# Patient Record
Sex: Female | Born: 1990
Health system: Southern US, Community
[De-identification: ages and names within clinical notes are randomized; demographics above are authoritative.]

## PROBLEM LIST (undated history)

## (undated) ENCOUNTER — Inpatient Hospital Stay (HOSPITAL_COMMUNITY): Payer: Self-pay

## (undated) DIAGNOSIS — N390 Urinary tract infection, site not specified: Secondary | ICD-10-CM

## (undated) HISTORY — PX: NO PAST SURGERIES: SHX2092

---

## 2015-10-16 ENCOUNTER — Inpatient Hospital Stay (HOSPITAL_COMMUNITY)
Admission: AD | Admit: 2015-10-16 | Discharge: 2015-10-16 | Disposition: A | Discharge status: Home / Self Care | Payer: Self-pay | Source: Ambulatory Visit | Attending: Obstetrics and Gynecology | Admitting: Obstetrics and Gynecology

## 2015-10-16 ENCOUNTER — Inpatient Hospital Stay (HOSPITAL_COMMUNITY): Payer: Self-pay

## 2015-10-16 ENCOUNTER — Encounter (HOSPITAL_COMMUNITY): Payer: Self-pay

## 2015-10-16 DIAGNOSIS — N760 Acute vaginitis: Secondary | ICD-10-CM | POA: Insufficient documentation

## 2015-10-16 DIAGNOSIS — M545 Low back pain, unspecified: Secondary | ICD-10-CM

## 2015-10-16 DIAGNOSIS — R102 Pelvic and perineal pain: Secondary | ICD-10-CM

## 2015-10-16 DIAGNOSIS — M546 Pain in thoracic spine: Secondary | ICD-10-CM | POA: Insufficient documentation

## 2015-10-16 DIAGNOSIS — B9689 Other specified bacterial agents as the cause of diseases classified elsewhere: Secondary | ICD-10-CM

## 2015-10-16 HISTORY — DX: Urinary tract infection, site not specified: N39.0

## 2015-10-16 LAB — URINALYSIS, ROUTINE W REFLEX MICROSCOPIC
BILIRUBIN URINE: NEGATIVE
Glucose, UA: NEGATIVE mg/dL
HGB URINE DIPSTICK: NEGATIVE
KETONES UR: NEGATIVE mg/dL
Nitrite: NEGATIVE
PROTEIN: NEGATIVE mg/dL
Specific Gravity, Urine: 1.02 (ref 1.005–1.030)
pH: 7 (ref 5.0–8.0)

## 2015-10-16 LAB — WET PREP, GENITAL
SPERM: NONE SEEN
Trich, Wet Prep: NONE SEEN
WBC WET PREP: NONE SEEN
Yeast Wet Prep HPF POC: NONE SEEN

## 2015-10-16 LAB — URINE MICROSCOPIC-ADD ON

## 2015-10-16 LAB — POCT PREGNANCY, URINE: PREG TEST UR: NEGATIVE

## 2015-10-16 MED ORDER — IBUPROFEN 600 MG PO TABS: 600.0000 mg | ORAL_TABLET | Freq: Three times a day (TID) | ORAL | 0 refills | Status: DC | PRN

## 2015-10-16 MED ORDER — METRONIDAZOLE 500 MG PO TABS: 500.0000 mg | ORAL_TABLET | Freq: Two times a day (BID) | ORAL | 0 refills | Status: DC

## 2015-10-16 MED ORDER — KETOROLAC TROMETHAMINE 30 MG/ML IJ SOLN
30.0000 mg | Freq: Once | INTRAMUSCULAR | Status: AC
  Administered 2015-10-16: 30 mg via INTRAMUSCULAR
  Filled 2015-10-16: qty 1

## 2015-10-16 NOTE — MAU Note (Signed)
Pt c/o back pain and lower abdominal pain that started today-took ibuprofen this morning and it helped some. Rating pain 7/10. LMP: May 25-26-has hx of irregular periods and is not on birth control. Denies vag bleeding or discharge. States she has burning with urination and increased frequency-feels like she cannot fully empty bladder.

## 2015-10-16 NOTE — MAU Provider Note (Signed)
History   [id number]   Chief Complaint  Patient presents with  . Abdominal Pain  . Back Pain  . Possible Pregnancy    HPI Karen Barajas is a 25 y.o. female  [id number] here with report of right sided mid upper back pain and lower left sided pelvic pain that started yesterday.  Pain in abdomen is described as sharp and intermittent in nature.  Pain is rated an 8/10.  Back pain is described as a spasm and also rated an 8/10.  No report of injury.  Reports vomiting x 1.  No report of fever, body aches, or chills.  Denies diarrhea or constipation.  +vaginal discharge with odor, white in color.    Patient's last menstrual period was 08/18/2015 (lmp unknown).  OB History  Gravida Para Term Preterm AB Living  0 0 0 0 0 0  SAB TAB Ectopic Multiple Live Births  0 0 0 0 0        Past Medical History:  Diagnosis Date  . UTI (lower urinary tract infection)     Family History  Problem Relation Age of Onset  . Diabetes Maternal Grandmother   . Hyperlipidemia Maternal Grandfather   . Cancer Paternal Grandfather     Social History   Social History  . Marital status: Single    Spouse name: N/A  . Number of children: N/A  . Years of education: N/A   Social History Main Topics  . Smoking status: Never Smoker  . Smokeless tobacco: Never Used  . Alcohol use None  . Drug use: No  . Sexual activity: Yes   Other Topics Concern  . None   Social History Narrative  . None    No Known Allergies  No current facility-administered medications on file prior to encounter.    No current outpatient prescriptions on file prior to encounter.     Review of Systems  Constitutional: Negative for chills and fever.  Gastrointestinal: Positive for abdominal pain, nausea and vomiting. Negative for abdominal distention, constipation and diarrhea.  Genitourinary: Positive for menstrual problem (irregular), pelvic pain and vaginal discharge. Negative for dysuria, flank pain, frequency  and vaginal bleeding.  Musculoskeletal: Positive for back pain.  Neurological: Negative for dizziness and light-headedness.     Physical Exam   Vitals:   10/16/15 2000 10/16/15 2008  BP: 134/77   Pulse: 78   Resp: 20   Temp: 98.2 F (36.8 C)   TempSrc: Oral   SpO2: 97%   Weight:  238 lb 1.6 oz (108 kg)  Height:  5' 1.42" (1.56 m)    Physical Exam  Constitutional: She is oriented to person, place, and time. She appears well-developed and well-nourished. No distress.  HENT:  Head: Normocephalic.  Neck: Normal range of motion. Neck supple.  Cardiovascular: Normal rate, regular rhythm and normal heart sounds.   Respiratory: Effort normal and breath sounds normal. No respiratory distress.  GI: Soft. She exhibits no mass. There is tenderness (LLQ). There is no rebound, no guarding and no CVA tenderness. No hernia.  Genitourinary: Cervix exhibits no motion tenderness and no discharge. Left adnexum displays tenderness. No bleeding in the vagina. Vaginal discharge (white, creamy; +odor) found.  Genitourinary Comments: Difficult to assess adnexa due to patient weight and habitus  Musculoskeletal: Normal range of motion. She exhibits no edema.       Arms: Neurological: She is alert and oriented to person, place, and time.  Skin: Skin is warm and dry.  Psychiatric: She has  a normal mood and affect.    MAU Course  Procedures  MDM Toradol 30 mg IM Pelvic ultrasound ordered  Ultrasound: Uterus Measurements: 6.3 x 3.2 x 3.4 cm. No fibroids or other mass visualized. Endometrium Thickness: 4.8 mm.  No focal abnormality visualized. Right ovary Measurements: 2.4 x 1.6 x 1.5 cm. Normal appearance/no adnexal mass. Left ovary Measurements: 3.0 x 1.8 x 1.9 cm. Normal appearance/no adnexal mass. Other findings No abnormal free fluid. IMPRESSION: Unremarkable pelvic ultrasound.  Results for orders placed or performed during the hospital encounter of 10/16/15 (from the past 24  hour(s))  Urinalysis, Routine w reflex microscopic (not at [facility])     Status: Abnormal   Collection Time: 10/16/15  7:50 PM  Result Value Ref Range   Color, Urine YELLOW YELLOW   APPearance CLEAR CLEAR   Specific Gravity, Urine 1.020 1.005 - 1.030   pH 7.0 5.0 - 8.0   Glucose, UA NEGATIVE NEGATIVE mg/dL   Hgb urine dipstick NEGATIVE NEGATIVE   Bilirubin Urine NEGATIVE NEGATIVE   Ketones, ur NEGATIVE NEGATIVE mg/dL   Protein, ur NEGATIVE NEGATIVE mg/dL   Nitrite NEGATIVE NEGATIVE   Leukocytes, UA TRACE (A) NEGATIVE  Urine microscopic-add on     Status: Abnormal   Collection Time: 10/16/15  7:50 PM  Result Value Ref Range   Squamous Epithelial / LPF 0-5 (A) NONE SEEN   WBC, UA 0-5 0 - 5 WBC/hpf   RBC / HPF 0-5 0 - 5 RBC/hpf   Bacteria, UA RARE (A) NONE SEEN  Wet prep, genital     Status: Abnormal   Collection Time: 10/16/15  8:20 PM  Result Value Ref Range   Yeast Wet Prep HPF POC NONE SEEN NONE SEEN   Trich, Wet Prep NONE SEEN NONE SEEN   Clue Cells Wet Prep HPF POC PRESENT (A) NONE SEEN   WBC, Wet Prep HPF POC NONE SEEN NONE SEEN   Sperm NONE SEEN   Pregnancy, urine POC     Status: None   Collection Time: 10/16/15  8:25 PM  Result Value Ref Range   Preg Test, Ur NEGATIVE NEGATIVE   2150 Back pain improved with Toradol Assessment and Plan  Pelvic Pain - Normal Ultrasound Bacterial Vaginosis Back Pain  Plan: Discharge home RX Ibuprofen 600 mg PO q 8 hours prn RX Flagyl 500 mg BID x 7 days Follow-up if no improvement or worsening of symptoms  *All information communicated via interpreter Midge Aver, CNM 10/16/2015 10:05 PM

## 2016-04-18 LAB — OB RESULTS CONSOLE GC/CHLAMYDIA: Gonorrhea: NEGATIVE

## 2016-05-01 ENCOUNTER — Emergency Department (HOSPITAL_COMMUNITY)
Admission: EM | Admit: 2016-05-01 | Discharge: 2016-05-02 | Disposition: A | Discharge status: Home / Self Care | Payer: Self-pay | Attending: Emergency Medicine | Admitting: Emergency Medicine

## 2016-05-01 ENCOUNTER — Encounter (HOSPITAL_COMMUNITY): Payer: Self-pay | Admitting: Emergency Medicine

## 2016-05-01 ENCOUNTER — Emergency Department (HOSPITAL_COMMUNITY): Payer: Self-pay

## 2016-05-01 DIAGNOSIS — R109 Unspecified abdominal pain: Secondary | ICD-10-CM

## 2016-05-01 DIAGNOSIS — Z79899 Other long term (current) drug therapy: Secondary | ICD-10-CM | POA: Insufficient documentation

## 2016-05-01 DIAGNOSIS — N12 Tubulo-interstitial nephritis, not specified as acute or chronic: Secondary | ICD-10-CM | POA: Insufficient documentation

## 2016-05-01 LAB — BASIC METABOLIC PANEL
ANION GAP: 10 (ref 5–15)
BUN: 9 mg/dL (ref 6–20)
CALCIUM: 9.2 mg/dL (ref 8.9–10.3)
CHLORIDE: 103 mmol/L (ref 101–111)
CO2: 22 mmol/L (ref 22–32)
Creatinine, Ser: 0.6 mg/dL (ref 0.44–1.00)
GFR calc Af Amer: >60 mL/min (ref >60)
GFR calc non Af Amer: >60 mL/min (ref >60)
GLUCOSE: 92 mg/dL (ref 65–99)
POTASSIUM: 4.4 mmol/L (ref 3.5–5.1)
Sodium: 135 mmol/L (ref 135–145)

## 2016-05-01 LAB — URINALYSIS, ROUTINE W REFLEX MICROSCOPIC
Bilirubin Urine: NEGATIVE
GLUCOSE, UA: NEGATIVE mg/dL
KETONES UR: NEGATIVE mg/dL
Nitrite: NEGATIVE
PH: 9 — AB (ref 5.0–8.0)
Protein, ur: 30 mg/dL — AB
Specific Gravity, Urine: 1.014 (ref 1.005–1.030)

## 2016-05-01 LAB — WET PREP, GENITAL
Clue Cells Wet Prep HPF POC: NONE SEEN
SPERM: NONE SEEN
Trich, Wet Prep: NONE SEEN
Yeast Wet Prep HPF POC: NONE SEEN

## 2016-05-01 LAB — CBC
HEMATOCRIT: 40.6 % (ref 36.0–46.0)
HEMOGLOBIN: 13.4 g/dL (ref 12.0–15.0)
MCH: 28.5 pg (ref 26.0–34.0)
MCHC: 33 g/dL (ref 30.0–36.0)
MCV: 86.2 fL (ref 78.0–100.0)
Platelets: 220 10*3/uL (ref 150–400)
RBC: 4.71 MIL/uL (ref 3.87–5.11)
RDW: 13.7 % (ref 11.5–15.5)
WBC: 8.7 10*3/uL (ref 4.0–10.5)

## 2016-05-01 MED ORDER — SODIUM CHLORIDE 0.9 % IV BOLUS (SEPSIS)
1000.0000 mL | Freq: Once | INTRAVENOUS | Status: AC
  Administered 2016-05-02: 1000 mL via INTRAVENOUS

## 2016-05-01 MED ORDER — KETOROLAC TROMETHAMINE 60 MG/2ML IM SOLN
30.0000 mg | Freq: Once | INTRAMUSCULAR | Status: AC
  Administered 2016-05-01: 30 mg via INTRAMUSCULAR
  Filled 2016-05-01: qty 2

## 2016-05-01 MED ORDER — DEXTROSE 5 % IV SOLN
1.0000 g | Freq: Once | INTRAVENOUS | Status: AC
  Administered 2016-05-02: 1 g via INTRAVENOUS
  Filled 2016-05-01: qty 10

## 2016-05-01 MED ORDER — KETOROLAC TROMETHAMINE 30 MG/ML IJ SOLN
30.0000 mg | Freq: Once | INTRAMUSCULAR | Status: DC
  Filled 2016-05-01: qty 1

## 2016-05-01 MED ORDER — SODIUM CHLORIDE 0.9 % IV BOLUS (SEPSIS): 1000.0000 mL | Freq: Once | INTRAVENOUS | Status: DC

## 2016-05-01 NOTE — ED Notes (Signed)
ED Provider at bedside using translator ipad.

## 2016-05-01 NOTE — ED Provider Notes (Signed)
[facility]-EMERGENCY DEPT Provider Note   CSN: [id number] Arrival date & time: 05/01/16  1723  By signing my name below, I, Freida Busman, attest that this documentation has been prepared under the direction and in the presence of [facility], PA-C. Electronically Signed: Freida Busman, Scribe. 05/01/2016. 9:31 PM.   History   Chief Complaint Chief Complaint  Patient presents with  . Flank Pain    The history is provided by the patient. A language interpreter was used (spanish).     HPI Comments:  Karen Barajas is a 26 y.o. female who presents to the Emergency Department complaining of 8/10, right flank pain and bilateral low back pain R>L since yesterday morning. She reports associated chills, frequent urination, and HA. Denies fever, abdominal pain or dysuria. No alleviating factors noted. Pt reports h/o similar symptoms ~ 2 years ago states she was told she may have kidney stones. She also has a h/o UTI and states pain today is also reminiscent of that episode. Pt is currently on her period. Denies vaginal discharge.   Past Medical History:  Diagnosis Date  . UTI (lower urinary tract infection)     There are no active problems to display for this patient.   Past Surgical History:  Procedure Laterality Date  . NO PAST SURGERIES      OB History    Gravida Para Term Preterm AB Living   0 0 0 0 0 0   SAB TAB Ectopic Multiple Live Births   0 0 0 0 0       Home Medications    Prior to Admission medications   Medication Sig Start Date End Date Taking? Authorizing Provider  ibuprofen (ADVIL,MOTRIN) 600 MG tablet Take 1 tablet (600 mg total) by mouth every 8 (eight) hours as needed. 10/16/15   Marlis Edelson, CNM  metroNIDAZOLE (FLAGYL) 500 MG tablet Take 1 tablet (500 mg total) by mouth 2 (two) times daily. 10/16/15   Marlis Edelson, CNM    Family History Family History  Problem Relation Age of Onset  . Diabetes Maternal Grandmother   . Hyperlipidemia Maternal  Grandfather   . Cancer Paternal Grandfather     Social History Social History  Substance Use Topics  . Smoking status: Never Smoker  . Smokeless tobacco: Never Used  . Alcohol use Not on file     Allergies   Patient has no known allergies.   Review of Systems Review of Systems  Neurological: Positive for headaches.     Physical Exam Updated Vital Signs BP 110/56   Pulse 100   Temp 100.5 F (38.1 C) (Oral)   Resp 20   LMP 05/01/2016 (Exact Date)   SpO2 100%   Physical Exam  Constitutional: She is oriented to person, place, and time. She appears well-developed and well-nourished. No distress.  Non-toxic   HENT:  Head: Normocephalic and atraumatic.  Cardiovascular: Normal rate, regular rhythm and normal heart sounds.   No murmur heard. Pulmonary/Chest: Effort normal and breath sounds normal. No respiratory distress.  Abdominal: Soft. She exhibits no distension. There is no tenderness. There is CVA tenderness.  CVA tenderness bilateral R>L  No abdominal tenderness   Genitourinary:  Genitourinary Comments: Chaperone present for exam. + vaginal bleeding - patient on menses. No rashes, lesions, or tenderness to external genitalia. No erythema, injury, or tenderness to vaginal mucosa. No  vaginal discharge. Mild left adnexal discomfort. No adnexal masses or fullness. No CMT.   Musculoskeletal: She exhibits no edema.  Neurological: She is alert and oriented to person, place, and time.  Skin: Skin is warm and dry.  Nursing note and vitals reviewed.    ED Treatments / Results  DIAGNOSTIC STUDIES:  Oxygen Saturation is 100% on RA, normal by my interpretation.    COORDINATION OF CARE:  9:31 PM Discussed treatment plan with pt at bedside and pt agreed to plan.  Labs (all labs ordered are listed, but only abnormal results are displayed) Labs Reviewed  URINALYSIS, ROUTINE W REFLEX MICROSCOPIC - Abnormal; Notable for the following:       Result Value   APPearance  HAZY (*)    pH 9.0 (*)    Hgb urine dipstick LARGE (*)    Protein, ur 30 (*)    Leukocytes, UA TRACE (*)    Bacteria, UA FEW (*)    Squamous Epithelial / LPF 0-5 (*)    All other components within normal limits  BASIC METABOLIC PANEL  CBC    EKG  EKG Interpretation None       Radiology No results found.  Procedures Procedures (including critical care time)  Medications Ordered in ED Medications - No data to display   Initial Impression / Assessment and Plan / ED Course  I have reviewed the triage vital signs and the nursing notes.  Pertinent labs & imaging results that were available during my care of the patient were reviewed by me and considered in my medical decision making (see chart for details).    Karen Barajas is a 26 y.o. female with hx of kidney stones who presents to ED for urinary frequency, bilateral low back pain and right flank pain. Initially was afebrile. On exam, normal heart rate and BP. No abdominal tenderness, but patient does have + CVA tenderness R>L. UA shows TNTC white cells. White count normal. On re-evaluation, patient now with temperature of 100.5. Pelvic exam performed with minimal adnexal tenderness. Lactic wdl. Given history of kidney stones, fever and pain today CT was obtained which shows findings c/w pyelonephritis. No stone present. Rocephin given in ED. Will discharge home with rx for Keflex. Urine sent for culture. After fluid bolus and toradol, patient again re-evaluated. She is tolerating PO with no episodes of emesis. She feels comfortable with discharge to home. Strict return precautions discussed. PCP follow up encouraged. All questions answered.   Patient discussed with Dr. Elesa MassedWard who agrees with treatment plan.    Final Clinical Impressions(s) / ED Diagnoses   Final diagnoses:  None    New Prescriptions New Prescriptions   No medications on file   I personally performed the services described in this documentation,  which was scribed in my presence. The recorded information has been reviewed and is accurate.     [facility]olescent Treatment FacilityJaime Pilcher Belita Warsame, PA-C 05/02/16 0352    Layla MawKristen N Karianne Nogueira, DO 05/02/16 [phone]

## 2016-05-01 NOTE — ED Notes (Signed)
Patient transported to US 

## 2016-05-01 NOTE — ED Triage Notes (Signed)
Pt reports right flank pain since this morning, pt reports urinary frequency and nausea. Pt denies fever or chills at home. Spanish interpretor used during triage.

## 2016-05-02 ENCOUNTER — Emergency Department (HOSPITAL_COMMUNITY): Payer: Self-pay

## 2016-05-02 LAB — PREGNANCY, URINE: PREG TEST UR: NEGATIVE

## 2016-05-02 LAB — GC/CHLAMYDIA PROBE AMP (~~LOC~~) NOT AT ARMC
Chlamydia: NEGATIVE
Neisseria Gonorrhea: NEGATIVE

## 2016-05-02 LAB — I-STAT CG4 LACTIC ACID, ED: Lactic Acid, Venous: 1.67 mmol/L (ref 0.5–1.9)

## 2016-05-02 MED ORDER — CEPHALEXIN 500 MG PO CAPS: 500.0000 mg | ORAL_CAPSULE | Freq: Four times a day (QID) | ORAL | 0 refills | Status: DC

## 2016-05-02 NOTE — ED Notes (Signed)
Pt returned from CT °

## 2016-05-02 NOTE — ED Notes (Signed)
Patient transported to CT 

## 2016-05-02 NOTE — ED Notes (Signed)
Pt verbalized understanding of discharge instructions. No further questions. VSS NAD A/Ox4

## 2016-05-02 NOTE — Discharge Instructions (Signed)
It was my pleasure taking care of you today! I hope you feel better soon!  Please take all of your antibiotics until finished! Ibuprofen as needed for pain/fever.  Please follow up with your primary care provider or the clinic listed if symptoms persist longer than three days.  Return to ER if you are unable to keep fluids down, you feel no better in 2-3 days, new or worsening symptoms develop, any additional concerns.

## 2016-05-04 LAB — URINE CULTURE: Culture: >=100000 — AB

## 2016-05-05 ENCOUNTER — Telehealth: Payer: Self-pay

## 2016-05-05 NOTE — Telephone Encounter (Signed)
Post ED Visit - Positive Culture Follow-up  Culture report reviewed by antimicrobial stewardship pharmacist:  []  Enzo BiNathan Batchelder, Pharm.D. []  Celedonio MiyamotoJeremy Frens, Pharm.D., BCPS []  Garvin FilaMike Maccia, Pharm.D. []  Georgina PillionElizabeth Martin, Pharm.D., BCPS []  StonecrestMinh Pham, [place]D., BCPS, AAHIVP []  Estella HuskMichelle Turner, Pharm.D., BCPS, AAHIVP []  Tennis Mustassie Stewart, Pharm.D. []  Sherle Poeob Vincent, Vermont[place]arm[place]D. Rachel Rumbarger Pharm D Positive urine culture Treated with Cephalexin, organism sensitive to the same and no further patient follow-up is required at this time.  Jerry CarasCullom, Vearl Aitken Burnett 05/05/2016, 10:55 AM

## 2016-10-31 ENCOUNTER — Ambulatory Visit: Payer: Self-pay

## 2016-10-31 ENCOUNTER — Encounter: Payer: Self-pay | Admitting: Advanced Practice Midwife

## 2016-10-31 ENCOUNTER — Ambulatory Visit (INDEPENDENT_AMBULATORY_CARE_PROVIDER_SITE_OTHER): Payer: Self-pay | Admitting: Advanced Practice Midwife

## 2016-10-31 ENCOUNTER — Other Ambulatory Visit: Payer: Self-pay | Admitting: Advanced Practice Midwife

## 2016-10-31 VITALS — BP 111/65 | HR 83 | Wt 260.2 lb

## 2016-10-31 DIAGNOSIS — Z3400 Encounter for supervision of normal first pregnancy, unspecified trimester: Secondary | ICD-10-CM | POA: Insufficient documentation

## 2016-10-31 DIAGNOSIS — Z113 Encounter for screening for infections with a predominantly sexual mode of transmission: Secondary | ICD-10-CM

## 2016-10-31 DIAGNOSIS — Z3482 Encounter for supervision of other normal pregnancy, second trimester: Secondary | ICD-10-CM

## 2016-10-31 DIAGNOSIS — Z3490 Encounter for supervision of normal pregnancy, unspecified, unspecified trimester: Secondary | ICD-10-CM

## 2016-10-31 DIAGNOSIS — O3680X0 Pregnancy with inconclusive fetal viability, not applicable or unspecified: Secondary | ICD-10-CM

## 2016-10-31 DIAGNOSIS — Z124 Encounter for screening for malignant neoplasm of cervix: Secondary | ICD-10-CM

## 2016-10-31 LAB — POCT PREGNANCY, URINE: PREG TEST UR: POSITIVE — AB

## 2016-10-31 LAB — POCT URINALYSIS DIP (DEVICE)
Bilirubin Urine: NEGATIVE
GLUCOSE, UA: NEGATIVE mg/dL
Hgb urine dipstick: NEGATIVE
Leukocytes, UA: NEGATIVE
Nitrite: NEGATIVE
PH: 6 (ref 5.0–8.0)
PROTEIN: NEGATIVE mg/dL
UROBILINOGEN UA: 0.2 mg/dL (ref 0.0–1.0)

## 2016-10-31 NOTE — Progress Notes (Signed)
Pt informed that the ultrasound is considered a limited OB ultrasound and is not intended to be a complete ultrasound exam.  Patient also informed that the ultrasound is not being completed with the intent of assessing for fetal or placental anomalies or any pelvic abnormalities.  Explained that the purpose of today's ultrasound is to assess for viability.  Patient acknowledges the purpose of the exam and the limitations of the study.   Single IUP FHR - 156 bpm per PW doppler FM present CRL - 63.3 mm (12 w 4d) FL  - 0.914 mm  (12w 5d) BEDD  05/11/17 per CRL  Wynelle BourgeoisMarie Williams, CNM notified

## 2016-11-01 ENCOUNTER — Encounter: Payer: Self-pay | Admitting: Advanced Practice Midwife

## 2016-11-01 NOTE — Patient Instructions (Signed)
First Trimester of Pregnancy The first trimester of pregnancy is from week 1 until the end of week 13 (months 1 through 3). A week after a sperm fertilizes an egg, the egg will implant on the wall of the uterus. This embryo will begin to develop into a baby. Genes from you and your partner will form the baby. The female genes will determine whether the baby will be a boy or a girl. At 6-8 weeks, the eyes and face will be formed, and the heartbeat can be seen on ultrasound. At the end of 12 weeks, all the baby's organs will be formed. Now that you are pregnant, you will want to do everything you can to have a healthy baby. Two of the most important things are to get good prenatal care and to follow your health care provider's instructions. Prenatal care is all the medical care you receive before the baby's birth. This care will help prevent, find, and treat any problems during the pregnancy and childbirth. Body changes during your first trimester Your body goes through many changes during pregnancy. The changes vary from woman to woman.  You may gain or lose a couple of pounds at first.  You may feel sick to your stomach (nauseous) and you may throw up (vomit). If the vomiting is uncontrollable, call your health care provider.  You may tire easily.  You may develop headaches that can be relieved by medicines. All medicines should be approved by your health care provider.  You may urinate more often. Painful urination may mean you have a bladder infection.  You may develop heartburn as a result of your pregnancy.  You may develop constipation because certain hormones are causing the muscles that push stool through your intestines to slow down.  You may develop hemorrhoids or swollen veins (varicose veins).  Your breasts may begin to grow larger and become tender. Your nipples may stick out more, and the tissue that surrounds them (areola) may become darker.  Your gums may bleed and may be  sensitive to brushing and flossing.  Dark spots or blotches (chloasma, mask of pregnancy) may develop on your face. This will likely fade after the baby is born.  Your menstrual periods will stop.  You may have a loss of appetite.  You may develop cravings for certain kinds of food.  You may have changes in your emotions from day to day, such as being excited to be pregnant or being concerned that something may go wrong with the pregnancy and baby.  You may have more vivid and strange dreams.  You may have changes in your hair. These can include thickening of your hair, rapid growth, and changes in texture. Some women also have hair loss during or after pregnancy, or hair that feels dry or thin. Your hair will most likely return to normal after your baby is born.  What to expect at prenatal visits During a routine prenatal visit:  You will be weighed to make sure you and the baby are growing normally.  Your blood pressure will be taken.  Your abdomen will be measured to track your baby's growth.  The fetal heartbeat will be listened to between weeks 10 and 14 of your pregnancy.  Test results from any previous visits will be discussed.  Your health care provider may ask you:  How you are feeling.  If you are feeling the baby move.  If you have had any abnormal symptoms, such as leaking fluid, bleeding, severe headaches,   or abdominal cramping.  If you are using any tobacco products, including cigarettes, chewing tobacco, and electronic cigarettes.  If you have any questions.  Other tests that may be performed during your first trimester include:  Blood tests to find your blood type and to check for the presence of any previous infections. The tests will also be used to check for low iron levels (anemia) and protein on red blood cells (Rh antibodies). Depending on your risk factors, or if you previously had diabetes during pregnancy, you may have tests to check for high blood  sugar that affects pregnant women (gestational diabetes).  Urine tests to check for infections, diabetes, or protein in the urine.  An ultrasound to confirm the proper growth and development of the baby.  Fetal screens for spinal cord problems (spina bifida) and Down syndrome.  HIV (human immunodeficiency virus) testing. Routine prenatal testing includes screening for HIV, unless you choose not to have this test.  You may need other tests to make sure you and the baby are doing well.  Follow these instructions at home: Medicines  Follow your health care provider's instructions regarding medicine use. Specific medicines may be either safe or unsafe to take during pregnancy.  Take a prenatal vitamin that contains at least 600 micrograms (mcg) of folic acid.  If you develop constipation, try taking a stool softener if your health care provider approves. Eating and drinking  Eat a balanced diet that includes fresh fruits and vegetables, whole grains, good sources of protein such as meat, eggs, or tofu, and low-fat dairy. Your health care provider will help you determine the amount of weight gain that is right for you.  Avoid raw meat and uncooked cheese. These carry germs that can cause birth defects in the baby.  Eating four or five small meals rather than three large meals a day may help relieve nausea and vomiting. If you start to feel nauseous, eating a few soda crackers can be helpful. Drinking liquids between meals, instead of during meals, also seems to help ease nausea and vomiting.  Limit foods that are high in fat and processed sugars, such as fried and sweet foods.  To prevent constipation: ? Eat foods that are high in fiber, such as fresh fruits and vegetables, whole grains, and beans. ? Drink enough fluid to keep your urine clear or pale yellow. Activity  Exercise only as directed by your health care provider. Most women can continue their usual exercise routine during  pregnancy. Try to exercise for 30 minutes at least 5 days a week. Exercising will help you: ? Control your weight. ? Stay in shape. ? Be prepared for labor and delivery.  Experiencing pain or cramping in the lower abdomen or lower back is a good sign that you should stop exercising. Check with your health care provider before continuing with normal exercises.  Try to avoid standing for long periods of time. Move your legs often if you must stand in one place for a long time.  Avoid heavy lifting.  Wear low-heeled shoes and practice good posture.  You may continue to have sex unless your health care provider tells you not to. Relieving pain and discomfort  Wear a good support bra to relieve breast tenderness.  Take warm sitz baths to soothe any pain or discomfort caused by hemorrhoids. Use hemorrhoid cream if your health care provider approves.  Rest with your legs elevated if you have leg cramps or low back pain.  If you develop   varicose veins in your legs, wear support hose. Elevate your feet for 15 minutes, 3-4 times a day. Limit salt in your diet. Prenatal care  Schedule your prenatal visits by the twelfth week of pregnancy. They are usually scheduled monthly at first, then more often in the last 2 months before delivery.  Write down your questions. Take them to your prenatal visits.  Keep all your prenatal visits as told by your health care provider. This is important. Safety  Wear your seat belt at all times when driving.  Make a list of emergency phone numbers, including numbers for family, friends, the hospital, and police and fire departments. General instructions  Ask your health care provider for a referral to a local prenatal education class. Begin classes no later than the beginning of month 6 of your pregnancy.  Ask for help if you have counseling or nutritional needs during pregnancy. Your health care provider can offer advice or refer you to specialists for help  with various needs.  Do not use hot tubs, steam rooms, or saunas.  Do not douche or use tampons or scented sanitary pads.  Do not cross your legs for long periods of time.  Avoid cat litter boxes and soil used by cats. These carry germs that can cause birth defects in the baby and possibly loss of the fetus by miscarriage or stillbirth.  Avoid all smoking, herbs, alcohol, and medicines not prescribed by your health care provider. Chemicals in these products affect the formation and growth of the baby.  Do not use any products that contain nicotine or tobacco, such as cigarettes and e-cigarettes. If you need help quitting, ask your health care provider. You may receive counseling support and other resources to help you quit.  Schedule a dentist appointment. At home, brush your teeth with a soft toothbrush and be gentle when you floss. Contact a health care provider if:  You have dizziness.  You have mild pelvic cramps, pelvic pressure, or nagging pain in the abdominal area.  You have persistent nausea, vomiting, or diarrhea.  You have a bad smelling vaginal discharge.  You have pain when you urinate.  You notice increased swelling in your face, hands, legs, or ankles.  You are exposed to fifth disease or chickenpox.  You are exposed to German measles (rubella) and have never had it. Get help right away if:  You have a fever.  You are leaking fluid from your vagina.  You have spotting or bleeding from your vagina.  You have severe abdominal cramping or pain.  You have rapid weight gain or loss.  You vomit blood or material that looks like coffee grounds.  You develop a severe headache.  You have shortness of breath.  You have any kind of trauma, such as from a fall or a car accident. Summary  The first trimester of pregnancy is from week 1 until the end of week 13 (months 1 through 3).  Your body goes through many changes during pregnancy. The changes vary from  woman to woman.  You will have routine prenatal visits. During those visits, your health care provider will examine you, discuss any test results you may have, and talk with you about how you are feeling. This information is not intended to replace advice given to you by your health care provider. Make sure you discuss any questions you have with your health care provider. Document Released: 03/06/2001 Document Revised: 02/22/2016 Document Reviewed: 02/22/2016 Elsevier Interactive Patient Education  2017 Elsevier   Inc.  

## 2016-11-01 NOTE — Progress Notes (Signed)
  Subjective:    Karen Barajas is a G1P0000 2859w5d being seen today for her first obstetrical visit.  Her obstetrical history is significant for nothing. Patient does intend to breast feed. Pregnancy history fully reviewed.  Patient reports no complaints.  Vitals:   10/31/16 1314  BP: 111/65  Pulse: 83  Weight: 260 lb 3.2 oz (118 kg)    HISTORY: OB History  Gravida Para Term Preterm AB Living  1 0 0 0 0 0  SAB TAB Ectopic Multiple Live Births  0 0 0 0 0    # Outcome Date GA Lbr Len/2nd Weight Sex Delivery Anes PTL Lv  1 Current              Past Medical History:  Diagnosis Date  . UTI (lower urinary tract infection)    Past Surgical History:  Procedure Laterality Date  . NO PAST SURGERIES     Family History  Problem Relation Age of Onset  . Diabetes Maternal Grandmother   . Hyperlipidemia Maternal Grandfather   . Cancer Paternal Grandfather      Exam    Uterus:  Fundal Height: 12 cm  Pelvic Exam:    Perineum: No Hemorrhoids, Normal Perineum   Vulva: Bartholin's, Urethra, Skene's normal   Vagina:  normal discharge   pH:    Cervix: no cervical motion tenderness   Adnexa: no mass, fullness, tenderness   Bony Pelvis: gynecoid  System: Breast:  normal appearance, no masses or tenderness   Skin: normal coloration and turgor, no rashes    Neurologic: oriented, grossly non-focal   Extremities: normal strength, tone, and muscle mass   HEENT neck supple with midline trachea   Mouth/Teeth mucous membranes moist, pharynx normal without lesions   Neck supple   Cardiovascular: regular rate and rhythm   Respiratory:  appears well, vitals normal, no respiratory distress, acyanotic, normal RR, ear and throat exam is normal, neck free of mass or lymphadenopathy, chest clear, no wheezing, crepitations, rhonchi, normal symmetric air entry   Abdomen: soft, non-tender; bowel sounds normal; no masses,  no organomegaly   Urinary: urethral meatus normal       Assessment:    Pregnancy: G1P0000 Patient Active Problem List   Diagnosis Date Noted  . Supervision of normal first pregnancy, antepartum 10/31/2016        Plan:     Initial labs drawn. Prenatal vitamins. Problem list reviewed and updated. Genetic Screening discussed First Screen: requested.  Ultrasound discussed; fetal survey: requested.  Follow up in 4 weeks. 50% of 30 min visit spent on counseling and coordination of care.  Routines reviewed   Karen Barajas 11/01/2016

## 2016-11-01 NOTE — Progress Notes (Signed)
Called [facility] to schedule patient( Detailed Anatomy Scan 14+) ultrasound appointment for 8 weeks from now, but they only schedule out 4 weeks in advance. Will have to call them back in the beginning of September to schedule for the end of the month.

## 2016-11-02 ENCOUNTER — Telehealth: Payer: Self-pay | Admitting: Lab

## 2016-11-02 LAB — HEMOGLOBINOPATHY EVALUATION
HGB C: 0 %
HGB S: 0 %
HGB VARIANT: 0 %
Hemoglobin A2 Quantitation: 2.5 % (ref 1.8–3.2)
Hemoglobin F Quantitation: 0 % (ref 0.0–2.0)
Hgb A: 97.5 % (ref 96.4–98.8)

## 2016-11-02 LAB — OBSTETRIC PANEL, INCLUDING HIV
ANTIBODY SCREEN: NEGATIVE
Basophils Absolute: 0 10*3/uL (ref 0.0–0.2)
Basos: 0 %
EOS (ABSOLUTE): 0.1 10*3/uL (ref 0.0–0.4)
EOS: 1 %
HEMOGLOBIN: 12.6 g/dL (ref 11.1–15.9)
HEP B S AG: NEGATIVE
HIV SCREEN 4TH GENERATION: NONREACTIVE
Hematocrit: 38.6 % (ref 34.0–46.6)
Immature Grans (Abs): 0 10*3/uL (ref 0.0–0.1)
Immature Granulocytes: 0 %
LYMPHS ABS: 2 10*3/uL (ref 0.7–3.1)
Lymphs: 27 %
MCH: 27.7 pg (ref 26.6–33.0)
MCHC: 32.6 g/dL (ref 31.5–35.7)
MCV: 85 fL (ref 79–97)
MONOS ABS: 0.7 10*3/uL (ref 0.1–0.9)
Monocytes: 9 %
NEUTROS ABS: 4.6 10*3/uL (ref 1.4–7.0)
Neutrophils: 63 %
Platelets: 226 10*3/uL (ref 150–379)
RBC: 4.55 x10E6/uL (ref 3.77–5.28)
RDW: 15.7 % — ABNORMAL HIGH (ref 12.3–15.4)
RH TYPE: POSITIVE
RPR: NONREACTIVE
Rubella Antibodies, IGG: 1.33 index (ref >0.99)
WBC: 7.3 10*3/uL (ref 3.4–10.8)

## 2016-11-02 LAB — CULTURE, OB URINE

## 2016-11-02 LAB — URINE CULTURE, OB REFLEX

## 2016-11-02 NOTE — Telephone Encounter (Signed)
Called patient emergency contact(Raquel Beola Cordarra) to contact patient about U/S appt. Patient Karen Barajas was with her mother, and I was able to give her the app[facility] time and date [facility]. Research officer, trade unionpanish Interpreter from [employer] [phone]

## 2016-11-05 LAB — CYTOLOGY - PAP
ADEQUACY: ABSENT
Chlamydia: NEGATIVE
DIAGNOSIS: NEGATIVE
NEISSERIA GONORRHEA: NEGATIVE

## 2016-11-08 ENCOUNTER — Encounter: Payer: Self-pay | Admitting: Family Medicine

## 2016-11-08 ENCOUNTER — Ambulatory Visit (HOSPITAL_COMMUNITY)
Admission: RE | Admit: 2016-11-08 | Discharge: 2016-11-08 | Disposition: A | Discharge status: Home / Self Care | Payer: Self-pay | Source: Ambulatory Visit | Attending: Advanced Practice Midwife | Admitting: Advanced Practice Midwife

## 2016-11-08 ENCOUNTER — Other Ambulatory Visit: Payer: Self-pay | Admitting: Advanced Practice Midwife

## 2016-11-08 ENCOUNTER — Encounter (HOSPITAL_COMMUNITY): Payer: Self-pay

## 2016-11-08 DIAGNOSIS — O99211 Obesity complicating pregnancy, first trimester: Secondary | ICD-10-CM

## 2016-11-08 DIAGNOSIS — O99210 Obesity complicating pregnancy, unspecified trimester: Secondary | ICD-10-CM

## 2016-11-08 DIAGNOSIS — O3680X0 Pregnancy with inconclusive fetal viability, not applicable or unspecified: Secondary | ICD-10-CM

## 2016-11-08 DIAGNOSIS — Z3A13 13 weeks gestation of pregnancy: Secondary | ICD-10-CM

## 2016-11-08 DIAGNOSIS — O99212 Obesity complicating pregnancy, second trimester: Secondary | ICD-10-CM | POA: Insufficient documentation

## 2016-11-08 DIAGNOSIS — Z3682 Encounter for antenatal screening for nuchal translucency: Secondary | ICD-10-CM

## 2016-11-08 DIAGNOSIS — Z3490 Encounter for supervision of normal pregnancy, unspecified, unspecified trimester: Secondary | ICD-10-CM

## 2016-11-08 DIAGNOSIS — Z3400 Encounter for supervision of normal first pregnancy, unspecified trimester: Secondary | ICD-10-CM

## 2016-11-12 ENCOUNTER — Other Ambulatory Visit: Payer: Self-pay

## 2016-11-28 ENCOUNTER — Other Ambulatory Visit: Payer: Self-pay | Admitting: Medical

## 2016-11-28 ENCOUNTER — Ambulatory Visit (INDEPENDENT_AMBULATORY_CARE_PROVIDER_SITE_OTHER): Payer: Self-pay | Admitting: Medical

## 2016-11-28 VITALS — BP 106/73 | HR 102 | Wt 257.5 lb

## 2016-11-28 DIAGNOSIS — Z3402 Encounter for supervision of normal first pregnancy, second trimester: Secondary | ICD-10-CM

## 2016-11-28 DIAGNOSIS — O99210 Obesity complicating pregnancy, unspecified trimester: Secondary | ICD-10-CM

## 2016-11-28 DIAGNOSIS — O99212 Obesity complicating pregnancy, second trimester: Secondary | ICD-10-CM

## 2016-11-28 DIAGNOSIS — Z3400 Encounter for supervision of normal first pregnancy, unspecified trimester: Secondary | ICD-10-CM

## 2016-11-28 NOTE — Progress Notes (Signed)
   PRENATAL VISIT NOTE  Subjective:  Karen Barajas is a 26 y.o. G1P0000 at 5544w4d being seen today for ongoing prenatal care.  She is currently monitored for the following issues for this low-risk pregnancy and has Supervision of normal first pregnancy, antepartum and Obesity affecting pregnancy, antepartum on her problem list.  Patient reports URI symptoms.  Contractions: Not present. Vag. Bleeding: None.  Movement: Present. Denies leaking of fluid.   The following portions of the patient's history were reviewed and updated as appropriate: allergies, current medications, past family history, past medical history, past social history, past surgical history and problem list. Problem list updated.  Objective:   Vitals:   11/28/16 1246  BP: 106/73  Pulse: (!) 102  Weight: 257 lb 8 oz (116.8 kg)    Fetal Status: Fetal Heart Rate (bpm): 150   Movement: Present     General:  Alert, oriented and cooperative. Patient is in no acute distress.  Skin: Skin is warm and dry. No rash noted.   Cardiovascular: Normal heart rate noted  Respiratory: Normal respiratory effort, no problems with respiration noted  Abdomen: Soft, gravid, appropriate for gestational age.  Pain/Pressure: Present     Pelvic: Cervical exam deferred        Extremities: Normal range of motion.  Edema: None  Mental Status:  Normal mood and affect. Normal behavior. Normal judgment and thought content.   Assessment and Plan:  Pregnancy: G1P0000 at 5444w4d  1. Supervision of normal first pregnancy, antepartum - US MFM OB DETAIL +14 WK; scheduled - AFP TETRA  2. Obesity affecting pregnancy, antepartum   Preterm labor symptoms and general obstetric precautions including but not limited to vaginal bleeding, contractions, leaking of fluid and fetal movement were reviewed in detail with the patient. Please refer to After Visit Summary for other counseling recommendations.  Return in about 4 weeks (around 12/26/2016) for  LOB.   Vonzella NippleJulie Zackariah Vanderpol, PA-C

## 2016-11-28 NOTE — Patient Instructions (Addendum)
Crecimiento del beb durante el embarazo (How a Baby Grows During Pregnancy) El embarazo comienza cuando el semen de un hombre ingresa al vulo de una mujer (fecundacin). Esto ocurre en una de las trompas de Falopio que conecta los ovarios con el tero. Al vulo fecundado se lo denomina embrin hasta que alcanza las 10semanas. A partir de las 10semanas y hasta el momento del parto, se llama feto. El vulo fecundado se desplaza por la trompa de Falopio hasta llegar al tero y luego se implanta en el endometrio y empieza crecer. El feto en crecimiento recibe oxgeno y nutrientes a travs del torrente sanguneo de la embarazada y de los tejidos que se forman (placenta) para la sustentacin fetal. La placenta es el sistema de sustentacin de la vida del feto, proporciona la nutricin y elimina los desechos. Informarse tanto como pueda sobre el embarazo y la forma en que se desarrolla el beb puede ayudarla a disfrutar de la experiencia, y, adems, a que se d cuenta de cundo puede haber un problema y cundo hacer preguntas. CUNTO DURA UN EMBARAZO NORMAL? Generalmente, el embarazo dura 280das, o unas 40semanas. Se divide tres trimestres:  Primer trimestre: desde la semana0 a la13.  Segundo trimestre: desde la semana14 a la27.  Tercer trimestre: desde la semana28 a la40. El da que se considera que el beb est listo para nacer (a trmino) es la fecha prevista de parto. CMO SE DESARROLLA EL BEB MES A MES? Primer mes  El vulo fecundado se implanta dentro del tero.  Algunas clulas formarn la placenta, y otras formarn el feto.  Empiezan a desarrollarse los brazos, las piernas, la mdula espinal, los pulmones y el corazn.  Al final del primer mes, el corazn comienza a latir. Segundo mes  Se forman los huesos, el odo interno, los prpados, las manos y los pies.  Se desarrollan los genitales.  Al final de las 8semanas, todos los rganos importantes estn en  desarrollo. Tercer mes  Se estn formando todos los rganos internos.  Se forman los dientes debajo de las encas.  Empiezan a crecer los huesos y los msculos. La columna vertebral tiene movimiento de flexin.  La piel es transparente.  Empiezan a formarse las uas de las manos y de los pies.  Los brazos y las piernas siguen alargndose, y se desarrollan las manos y los pies.  El feto mide aproximadamente 3pulgadas (7,6cm) de largo. Cuarto mes  La placenta est totalmente formada.  Se han formado los rganos sexuales externos, el cuello, las orejas, las cejas, los prpados y las uas de las manos.  El feto puede or, tragar y mover los brazos y las piernas.  Los riones empiezan a producir orina.  La piel est recubierta por una sustancia sebcea blanca (unto sebceo) y un vello muy fino (lanugo). Quinto mes  El feto se mueve ms y es posible sentirlo por primera vez (da pataditas).  Empieza a dormir y despertarse, y tal vez comience a chuparse el dedo.  Crecen las uas en las puntas de los dedos.  Funciona el rgano del sistema digestivo que produce bilis (vescula biliar) y ayuda a digerir los nutrientes.  Si el beb es nia, tiene vulos en los ovarios. Si el beb es varn, los testculos empiezan a descender hasta el escroto. Sexto mes  Se han formado los pulmones, pero el feto an no puede respirar.  Los ojos se abren. El cerebro sigue desarrollndose.  El beb tiene huellas en los dedos de las manos y   los pies. El cabello del beb se vuelve ms abundante.  A fines del segundo trimestre, el feto mide aproximadamente 9pulgadas (22,9cm) de largo. Sptimo mes  El feto patea y se estira.  Los ojos se han desarrollado lo suficiente como para percibir los cambios de luz.  Las manos pueden hacer movimientos de prensin.  El feto responde a los ruidos. Octavo mes  Todos los rganos, as como los sistemas y aparatos del organismo, estn totalmente  desarrollados y en funcionamiento.  Los huesos se solidifican, y se desarrollan los botones gustativos. Es posible que el feto tenga hipo.  Determinadas regiones del cerebro an se estn desarrollando. El crneo sigue siendo blando. Noveno mes  El feto aumenta aproximadamente libra (230g) cada semana.  Los pulmones estn totalmente desarrollados.  Se desarrollan los hbitos de sueo.  Generalmente, el feto se acomoda con la cabeza hacia abajo (presentacin ceflica de vrtice) en el tero para prepararse para el parto. En cambio, si los glteos se acomodan en esta posicin, el beb est de nalgas.  El feto pesa entre 6 y 9libras (2,72 y 4,08kg) y mide entre 19 y 20pulgadas (48,26 a 50,8cm) de largo. QU PUEDO HACER PARA QUE EL EMBARAZO SEA SANO Y PARA AYUDAR AL BEB A DESARROLLARSE? Comida y bebida  Consuma una dieta saludable. ? Hable con el mdico para asegurarse de que est recibiendo los nutrientes que usted y el beb necesitan. ? Visite www.choosemyplate.gov para obtener ms informacin sobre cmo crear una dieta saludable.  El mdico le aconsejar cul es la cantidad saludable de peso a aumentar durante el embarazo, por lo general, entre 25 y 35libras (11 y 16kg). Puede ser necesario que: ? Aumente ms si tena bajo peso antes de quedar embarazada o si est embarazada de ms de un beb. ? Aumente menos si tena sobrepeso u obesidad cuando qued embarazada. Medicamentos y vitaminas  Tome las vitaminas prenatales como se lo haya indicado el mdico, entre ellas, cido flico, hierro, calcio y vitaminaD, que son importantes para el desarrollo saludable.  Tome los medicamentos solamente como se lo haya indicado el mdico. Lea las etiquetas y consulte al farmacutico o al mdico si puede tomar medicamentos de venta libre, suplementos y medicamentos recetados durante el embarazo. Actividades  Haga actividad fsica como se lo haya aconsejado el mdico. Pdale al mdico que  le recomiende actividades que sean seguras para usted, como caminar o practicar natacin.  No participe en deportes extremos ni extenuantes. Estilo de vida  No beba alcohol.  No consuma ningn producto que contenga tabaco, lo que incluye cigarrillos, tabaco de mascar o cigarrillos electrnicos. Si necesita ayuda para dejar de fumar, consulte al mdico.  No consuma drogas. Seguridad  No se exponga al mercurio, al plomo ni a otros metales pesados. Pregntele al mdico acerca de las fuentes comunes de estos metales pesados.  Evite la infeccin por listeria durante el embarazo. Tome las siguientes precauciones: ? No coma quesos blandos ni fiambres. ? No coma perros calientes, salvo que hayan sido calentados al punto de emitir vapor, por ejemplo, en el microondas. ? No tome leche no pasteurizada.  Evite la infeccin por toxoplasmosis durante el embarazo. Tome las siguientes precauciones: ? No cambie la arena sanitaria del gato, si tiene uno. Pdale a otra persona que lo haga por usted. ? Use guantes de jardinera mientras trabaja en el jardn. Instrucciones generales  Concurra a todas las visitas de control como se lo haya indicado el mdico. Esto es importante. Estas incluyen las visitas   de cuidado prenatal y las pruebas de deteccin.  Mantenga las enfermedades crnicas bajo control. Trabaje en estrecha colaboracin con el mdico para mantener las enfermedades bajo control, por ejemplo, la diabetes. CMO S SI EL BEB SE EST DESARROLLANDO BIEN? En cada visita de cuidado prenatal, el mdico har varios estudios diferentes para controlar su estado de salud y hacer un seguimiento del desarrollo del beb. Estos incluyen los siguientes:  Altura uterina. ? El mdico le medir el vientre en crecimiento desde la parte superior a la inferior con una cinta mtrica. ? Adems, le palpar el vientre para determinar la posicin del beb.  Latido cardaco. ? Una ecografa realizada en el primer  trimestre puede confirmar el embarazo y mostrar un latido cardaco, dependiendo del tiempo de gestacin. ? El mdico controlar la frecuencia cardaca del beb en cada visita de cuidado prenatal. ? A medida que se aproxima la fecha de parto, tal vez se hagan controles habituales de la frecuencia cardaca para garantizar que no haya sufrimiento fetal.  Ecografa del segundo trimestre. ? Esta ecografa controla el desarrollo del beb y tambin indica su sexo. QU DEBO HACER SI TENGO ALGUNA INQUIETUD RESPECTO DEL DESARROLLO DEL BEB? Hable siempre con el mdico si tiene alguna inquietud. Esta informacin no tiene como fin reemplazar el consejo del mdico. Asegrese de hacerle al mdico cualquier pregunta que tenga. Document Released: 08/29/2007 Document Revised: 07/04/2015 Document Reviewed: 08/19/2013 Elsevier Interactive Patient Education  2018 Elsevier Inc.  

## 2016-12-03 ENCOUNTER — Encounter: Payer: Self-pay | Admitting: *Deleted

## 2016-12-03 LAB — SCREEN, FIRST TRIMESTER, SERUM: FIRST TRIMESTER SAMPLE: NEGATIVE

## 2016-12-05 LAB — AFP TETRA
DIA Mom Value: 1
DIA Value (EIA): 127.1 pg/mL
DSR (BY AGE) 1 IN: 960
DSR (SECOND TRIMESTER) 1 IN: 10000
Gestational Age: 16 WEEKS
MSAFP MOM: 1.5
MSAFP: 35.7 ng/mL
MSHCG MOM: 1.5
MSHCG: 42052 m[IU]/mL
Maternal Age At EDD: 26.4 yr
Osb Risk: 2734
TEST RESULTS AFP: NEGATIVE
Weight: 257 [lb_av]
uE3 Mom: 1.37
uE3 Value: 0.92 ng/mL

## 2016-12-20 ENCOUNTER — Other Ambulatory Visit (HOSPITAL_COMMUNITY): Payer: Self-pay | Admitting: *Deleted

## 2016-12-20 ENCOUNTER — Ambulatory Visit (HOSPITAL_COMMUNITY)
Admission: RE | Admit: 2016-12-20 | Discharge: 2016-12-20 | Disposition: A | Discharge status: Home / Self Care | Payer: Self-pay | Source: Ambulatory Visit | Attending: Medical | Admitting: Medical

## 2016-12-20 ENCOUNTER — Other Ambulatory Visit: Payer: Self-pay | Admitting: Medical

## 2016-12-20 DIAGNOSIS — Z3A19 19 weeks gestation of pregnancy: Secondary | ICD-10-CM | POA: Insufficient documentation

## 2016-12-20 DIAGNOSIS — O99210 Obesity complicating pregnancy, unspecified trimester: Secondary | ICD-10-CM

## 2016-12-20 DIAGNOSIS — O359XX0 Maternal care for (suspected) fetal abnormality and damage, unspecified, not applicable or unspecified: Secondary | ICD-10-CM

## 2016-12-20 DIAGNOSIS — O358XX0 Maternal care for other (suspected) fetal abnormality and damage, not applicable or unspecified: Secondary | ICD-10-CM | POA: Insufficient documentation

## 2016-12-20 DIAGNOSIS — O99212 Obesity complicating pregnancy, second trimester: Secondary | ICD-10-CM | POA: Insufficient documentation

## 2016-12-20 DIAGNOSIS — Z3400 Encounter for supervision of normal first pregnancy, unspecified trimester: Secondary | ICD-10-CM

## 2016-12-20 DIAGNOSIS — Z0489 Encounter for examination and observation for other specified reasons: Secondary | ICD-10-CM

## 2016-12-20 DIAGNOSIS — Z3689 Encounter for other specified antenatal screening: Secondary | ICD-10-CM | POA: Insufficient documentation

## 2016-12-20 DIAGNOSIS — IMO0002 Reserved for concepts with insufficient information to code with codable children: Secondary | ICD-10-CM

## 2016-12-26 ENCOUNTER — Encounter: Payer: Self-pay | Admitting: Obstetrics & Gynecology

## 2016-12-28 ENCOUNTER — Other Ambulatory Visit: Payer: Self-pay

## 2016-12-31 ENCOUNTER — Telehealth (HOSPITAL_COMMUNITY): Payer: Self-pay | Admitting: MS"

## 2016-12-31 NOTE — Telephone Encounter (Signed)
Attempted to contact patient regarding results of Panorama (NIPS), which are within normal limits. Number listed for patient has automated message saying that the number is not in service. Unable to leave a voicemail for the patient to return call.  Karen Barajas. 12/31/2016 10:17 AM

## 2017-01-17 ENCOUNTER — Other Ambulatory Visit (HOSPITAL_COMMUNITY): Payer: Self-pay | Admitting: Maternal & Fetal Medicine

## 2017-01-17 ENCOUNTER — Ambulatory Visit (HOSPITAL_COMMUNITY)
Admission: RE | Admit: 2017-01-17 | Discharge: 2017-01-17 | Disposition: A | Discharge status: Home / Self Care | Payer: Self-pay | Source: Ambulatory Visit | Attending: Medical | Admitting: Medical

## 2017-01-17 DIAGNOSIS — Z3A23 23 weeks gestation of pregnancy: Secondary | ICD-10-CM

## 2017-01-17 DIAGNOSIS — Z0489 Encounter for examination and observation for other specified reasons: Secondary | ICD-10-CM | POA: Insufficient documentation

## 2017-01-17 DIAGNOSIS — IMO0002 Reserved for concepts with insufficient information to code with codable children: Secondary | ICD-10-CM

## 2017-01-17 DIAGNOSIS — Z362 Encounter for other antenatal screening follow-up: Secondary | ICD-10-CM

## 2017-01-17 DIAGNOSIS — O402XX0 Polyhydramnios, second trimester, not applicable or unspecified: Secondary | ICD-10-CM | POA: Insufficient documentation

## 2017-01-17 DIAGNOSIS — O99212 Obesity complicating pregnancy, second trimester: Secondary | ICD-10-CM | POA: Insufficient documentation

## 2017-01-23 ENCOUNTER — Ambulatory Visit (INDEPENDENT_AMBULATORY_CARE_PROVIDER_SITE_OTHER): Payer: Self-pay | Admitting: Medical

## 2017-01-23 VITALS — BP 104/68 | HR 94 | Wt 263.0 lb

## 2017-01-23 DIAGNOSIS — O409XX0 Polyhydramnios, unspecified trimester, not applicable or unspecified: Secondary | ICD-10-CM

## 2017-01-23 DIAGNOSIS — Z3400 Encounter for supervision of normal first pregnancy, unspecified trimester: Secondary | ICD-10-CM

## 2017-01-23 DIAGNOSIS — O289 Unspecified abnormal findings on antenatal screening of mother: Secondary | ICD-10-CM

## 2017-01-23 DIAGNOSIS — Z23 Encounter for immunization: Secondary | ICD-10-CM

## 2017-01-23 DIAGNOSIS — Z3402 Encounter for supervision of normal first pregnancy, second trimester: Secondary | ICD-10-CM

## 2017-01-23 NOTE — Addendum Note (Signed)
Addended by: Shelly BombardPOLK, Eusebia Grulke M on: 01/23/2017 01:11 PM   Modules accepted: Orders

## 2017-01-23 NOTE — Progress Notes (Signed)
   PRENATAL VISIT NOTE  Subjective:  Karen Barajas is a 26 y.o. G1P0000 at 5960w4d being seen today for ongoing prenatal care.  She is currently monitored for the following issues for this low-risk pregnancy and has Supervision of normal first pregnancy, antepartum and Obesity affecting pregnancy, antepartum on her problem list.  Patient reports occasional cramping.  Contractions: Not present. Vag. Bleeding: None.  Movement: Present. Denies leaking of fluid.   The following portions of the patient's history were reviewed and updated as appropriate: allergies, current medications, past family history, past medical history, past social history, past surgical history and problem list. Problem list updated.  Objective:   Vitals:   01/23/17 1140  BP: 104/68  Pulse: 94  Weight: 263 lb (119.3 kg)    Fetal Status: Fetal Heart Rate (bpm): 144 Fundal Height: 26 cm Movement: Present     General:  Alert, oriented and cooperative. Patient is in no acute distress.  Skin: Skin is warm and dry. No rash noted.   Cardiovascular: Normal heart rate noted  Respiratory: Normal respiratory effort, no problems with respiration noted  Abdomen: Soft, gravid, appropriate for gestational age.  Pain/Pressure: Present     Pelvic: Cervical exam deferred        Extremities: Normal range of motion.  Edema: None  Mental Status:  Normal mood and affect. Normal behavior. Normal judgment and thought content.   Assessment and Plan:  Pregnancy: G1P0000 at 2360w4d  1. Supervision of normal first pregnancy, antepartum -  Discussed GTT at next visit, needs to be fasting  -  Borderline high AFI on last US. Follow-up for growth and re-assessment of AFI in 1 month scheduled today   Preterm labor symptoms and general obstetric precautions including but not limited to vaginal bleeding, contractions, leaking of fluid and fetal movement were reviewed in detail with the patient. Please refer to After Visit Summary for other  counseling recommendations.  Return in about 4 weeks (around 02/20/2017) for LOB, 28 week labs (fasting).   Vonzella NippleJulie Chidi Shirer, PA-C

## 2017-01-23 NOTE — Progress Notes (Signed)
Pt stated having cramping symptoms when sitting.

## 2017-01-23 NOTE — Patient Instructions (Signed)
Crecimiento del beb durante el embarazo (How a Baby Grows During Pregnancy) El embarazo comienza cuando el semen de un hombre ingresa al vulo de una mujer (fecundacin). Esto ocurre en una de las trompas de Falopio que conecta los ovarios con el tero. Al vulo fecundado se lo denomina embrin hasta que alcanza las 10semanas. A partir de las 10semanas y hasta el momento del parto, se llama feto. El vulo fecundado se desplaza por la trompa de Falopio hasta llegar al tero y luego se implanta en el endometrio y empieza crecer. El feto en crecimiento recibe oxgeno y nutrientes a travs del torrente sanguneo de la embarazada y de los tejidos que se forman (placenta) para la sustentacin fetal. La placenta es el sistema de sustentacin de la vida del feto, proporciona la nutricin y elimina los desechos. Informarse tanto como pueda sobre el embarazo y la forma en que se desarrolla el beb puede ayudarla a disfrutar de la experiencia, y, adems, a que se d cuenta de cundo puede haber un problema y cundo hacer preguntas. CUNTO DURA UN EMBARAZO NORMAL? Generalmente, el embarazo dura 280das, o unas 40semanas. Se divide tres trimestres:  Primer trimestre: desde la semana0 a la13.  Segundo trimestre: desde la semana14 a la27.  Tercer trimestre: desde la semana28 a la40. El da que se considera que el beb est listo para nacer (a trmino) es la fecha prevista de parto. CMO SE DESARROLLA EL BEB MES A MES? Primer mes  El vulo fecundado se implanta dentro del tero.  Algunas clulas formarn la placenta, y otras formarn el feto.  Empiezan a desarrollarse los brazos, las piernas, la mdula espinal, los pulmones y el corazn.  Al final del primer mes, el corazn comienza a latir. Segundo mes  Se forman los huesos, el odo interno, los prpados, las manos y los pies.  Se desarrollan los genitales.  Al final de las 8semanas, todos los rganos importantes estn en  desarrollo. Tercer mes  Se estn formando todos los rganos internos.  Se forman los dientes debajo de las encas.  Empiezan a crecer los huesos y los msculos. La columna vertebral tiene movimiento de flexin.  La piel es transparente.  Empiezan a formarse las uas de las manos y de los pies.  Los brazos y las piernas siguen alargndose, y se desarrollan las manos y los pies.  El feto mide aproximadamente 3pulgadas (7,6cm) de largo. Cuarto mes  La placenta est totalmente formada.  Se han formado los rganos sexuales externos, el cuello, las orejas, las cejas, los prpados y las uas de las manos.  El feto puede or, tragar y mover los brazos y las piernas.  Los riones empiezan a producir orina.  La piel est recubierta por una sustancia sebcea blanca (unto sebceo) y un vello muy fino (lanugo). Quinto mes  El feto se mueve ms y es posible sentirlo por primera vez (da pataditas).  Empieza a dormir y despertarse, y tal vez comience a chuparse el dedo.  Crecen las uas en las puntas de los dedos.  Funciona el rgano del sistema digestivo que produce bilis (vescula biliar) y ayuda a digerir los nutrientes.  Si el beb es nia, tiene vulos en los ovarios. Si el beb es varn, los testculos empiezan a descender hasta el escroto. Sexto mes  Se han formado los pulmones, pero el feto an no puede respirar.  Los ojos se abren. El cerebro sigue desarrollndose.  El beb tiene huellas en los dedos de las manos y   los pies. El cabello del beb se vuelve ms abundante.  A fines del segundo trimestre, el feto mide aproximadamente 9pulgadas (22,9cm) de largo. Sptimo mes  El feto patea y se estira.  Los ojos se han desarrollado lo suficiente como para percibir los cambios de luz.  Las manos pueden hacer movimientos de prensin.  El feto responde a los ruidos. Octavo mes  Todos los rganos, as como los sistemas y aparatos del organismo, estn totalmente  desarrollados y en funcionamiento.  Los huesos se solidifican, y se desarrollan los botones gustativos. Es posible que el feto tenga hipo.  Determinadas regiones del cerebro an se estn desarrollando. El crneo sigue siendo blando. Noveno mes  El feto aumenta aproximadamente libra (230g) cada semana.  Los pulmones estn totalmente desarrollados.  Se desarrollan los hbitos de sueo.  Generalmente, el feto se acomoda con la cabeza hacia abajo (presentacin ceflica de vrtice) en el tero para prepararse para el parto. En cambio, si los glteos se acomodan en esta posicin, el beb est de nalgas.  El feto pesa entre 6 y 9libras (2,72 y 4,08kg) y mide entre 19 y 20pulgadas (48,26 a 50,8cm) de largo. QU PUEDO HACER PARA QUE EL EMBARAZO SEA SANO Y PARA AYUDAR AL BEB A DESARROLLARSE? Comida y bebida  Consuma una dieta saludable. ? Hable con el mdico para asegurarse de que est recibiendo los nutrientes que usted y el beb necesitan. ? Visite www.choosemyplate.gov para obtener ms informacin sobre cmo crear una dieta saludable.  El mdico le aconsejar cul es la cantidad saludable de peso a aumentar durante el embarazo, por lo general, entre 25 y 35libras (11 y 16kg). Puede ser necesario que: ? Aumente ms si tena bajo peso antes de quedar embarazada o si est embarazada de ms de un beb. ? Aumente menos si tena sobrepeso u obesidad cuando qued embarazada. Medicamentos y vitaminas  Tome las vitaminas prenatales como se lo haya indicado el mdico, entre ellas, cido flico, hierro, calcio y vitaminaD, que son importantes para el desarrollo saludable.  Tome los medicamentos solamente como se lo haya indicado el mdico. Lea las etiquetas y consulte al farmacutico o al mdico si puede tomar medicamentos de venta libre, suplementos y medicamentos recetados durante el embarazo. Actividades  Haga actividad fsica como se lo haya aconsejado el mdico. Pdale al mdico que  le recomiende actividades que sean seguras para usted, como caminar o practicar natacin.  No participe en deportes extremos ni extenuantes. Estilo de vida  No beba alcohol.  No consuma ningn producto que contenga tabaco, lo que incluye cigarrillos, tabaco de mascar o cigarrillos electrnicos. Si necesita ayuda para dejar de fumar, consulte al mdico.  No consuma drogas. Seguridad  No se exponga al mercurio, al plomo ni a otros metales pesados. Pregntele al mdico acerca de las fuentes comunes de estos metales pesados.  Evite la infeccin por listeria durante el embarazo. Tome las siguientes precauciones: ? No coma quesos blandos ni fiambres. ? No coma perros calientes, salvo que hayan sido calentados al punto de emitir vapor, por ejemplo, en el microondas. ? No tome leche no pasteurizada.  Evite la infeccin por toxoplasmosis durante el embarazo. Tome las siguientes precauciones: ? No cambie la arena sanitaria del gato, si tiene uno. Pdale a otra persona que lo haga por usted. ? Use guantes de jardinera mientras trabaja en el jardn. Instrucciones generales  Concurra a todas las visitas de control como se lo haya indicado el mdico. Esto es importante. Estas incluyen las visitas   de cuidado prenatal y las pruebas de deteccin.  Mantenga las enfermedades crnicas bajo control. Trabaje en estrecha colaboracin con el mdico para mantener las enfermedades bajo control, por ejemplo, la diabetes. CMO S SI EL BEB SE EST DESARROLLANDO BIEN? En cada visita de cuidado prenatal, el mdico har varios estudios diferentes para controlar su estado de salud y hacer un seguimiento del desarrollo del beb. Estos incluyen los siguientes:  Altura uterina. ? El mdico le medir el vientre en crecimiento desde la parte superior a la inferior con una cinta mtrica. ? Adems, le palpar el vientre para determinar la posicin del beb.  Latido cardaco. ? Una ecografa realizada en el primer  trimestre puede confirmar el embarazo y mostrar un latido cardaco, dependiendo del tiempo de gestacin. ? El mdico controlar la frecuencia cardaca del beb en cada visita de cuidado prenatal. ? A medida que se aproxima la fecha de parto, tal vez se hagan controles habituales de la frecuencia cardaca para garantizar que no haya sufrimiento fetal.  Ecografa del segundo trimestre. ? Esta ecografa controla el desarrollo del beb y tambin indica su sexo. QU DEBO HACER SI TENGO ALGUNA INQUIETUD RESPECTO DEL DESARROLLO DEL BEB? Hable siempre con el mdico si tiene alguna inquietud. Esta informacin no tiene como fin reemplazar el consejo del mdico. Asegrese de hacerle al mdico cualquier pregunta que tenga. Document Released: 08/29/2007 Document Revised: 07/04/2015 Document Reviewed: 08/19/2013 Elsevier Interactive Patient Education  2018 Elsevier Inc.  

## 2017-02-21 ENCOUNTER — Ambulatory Visit (INDEPENDENT_AMBULATORY_CARE_PROVIDER_SITE_OTHER): Payer: Self-pay | Admitting: Obstetrics & Gynecology

## 2017-02-21 VITALS — BP 105/77 | HR 79 | Wt 264.2 lb

## 2017-02-21 DIAGNOSIS — O99213 Obesity complicating pregnancy, third trimester: Secondary | ICD-10-CM

## 2017-02-21 DIAGNOSIS — Z23 Encounter for immunization: Secondary | ICD-10-CM

## 2017-02-21 DIAGNOSIS — Z3403 Encounter for supervision of normal first pregnancy, third trimester: Secondary | ICD-10-CM

## 2017-02-21 DIAGNOSIS — O99210 Obesity complicating pregnancy, unspecified trimester: Secondary | ICD-10-CM

## 2017-02-21 DIAGNOSIS — Z3400 Encounter for supervision of normal first pregnancy, unspecified trimester: Secondary | ICD-10-CM

## 2017-02-21 MED ORDER — TETANUS-DIPHTH-ACELL PERTUSSIS 5-2.5-18.5 LF-MCG/0.5 IM SUSP
0.5000 mL | Freq: Once | INTRAMUSCULAR | Status: AC
  Administered 2017-02-21: 0.5 mL via INTRAMUSCULAR

## 2017-02-21 NOTE — Progress Notes (Signed)
   PRENATAL VISIT NOTE  Subjective:  Karen Barajas is a 26 y.o. G1P0000 at 5938w5d being seen today for ongoing prenatal care. Patient is Spanish-speaking only, Spanish interpreter present for this encounter.  She is currently monitored for the following issues for this low-risk pregnancy and has Supervision of normal first pregnancy, antepartum and Maternal morbid obesity, antepartum (HCC) on their problem list.  Patient reports no complaints.  Contractions: Not present. Vag. Bleeding: None.  Movement: Present. Denies leaking of fluid.   The following portions of the patient's history were reviewed and updated as appropriate: allergies, current medications, past family history, past medical history, past social history, past surgical history and problem list. Problem list updated.  Objective:   Vitals:   02/21/17 0822  BP: 105/77  Pulse: 79  Weight: 264 lb 3.2 oz (119.8 kg)    Fetal Status: Fetal Heart Rate (bpm): 147 Fundal Height: 30 cm Movement: Present     General:  Alert, oriented and cooperative. Patient is in no acute distress.  Skin: Skin is warm and dry. No rash noted.   Cardiovascular: Normal heart rate noted  Respiratory: Normal respiratory effort, no problems with respiration noted  Abdomen: Soft, gravid, appropriate for gestational age.  Pain/Pressure: Present     Pelvic: Cervical exam deferred        Extremities: Normal range of motion.  Edema: None  Mental Status:  Normal mood and affect. Normal behavior. Normal judgment and thought content.   Assessment and Plan:  Pregnancy: G1P0000 at 3838w5d  1. Maternal morbid obesity, antepartum (HCC) Total weight gain 14#.   2. Supervision of normal first pregnancy, antepartum Third trimester labs, Tdap today. - Glucose Tolerance, 2 Hours w/1 Hour - RPR - CBC - HIV antibody (with reflex) Preterm labor symptoms and general obstetric precautions including but not limited to vaginal bleeding, contractions, leaking of  fluid and fetal movement were reviewed in detail with the patient. Please refer to After Visit Summary for other counseling recommendations.  Return in about 2 weeks (around 03/07/2017) for OB Visit (LOB).   Jaynie CollinsUgonna Shonte Beutler, MD

## 2017-02-21 NOTE — Progress Notes (Signed)
Spanish Interpreter Elna BreslowCarol Barajas  28 wk packet given Tdap vaccine given  Educated pt on Skin to Skin

## 2017-02-21 NOTE — Patient Instructions (Addendum)
Regrese a la clinica cuando tenga su cita. Si tiene problemas o preguntas, llama a la clinica o vaya a la [facility]a [facility] emergencia [facility]    Alivio del dolor durante el trabajo de parto y el parto (Pain Relief During Labor and Delivery) Muchas cosas pueden causar dolor durante el [place]ajo de parto y el parto, entre ellas, las siguientes:  La presin en los huesos y los ligamentos debido al paso del beb a travs de la pelvis.  La distensin de los tejidos debido al paso del beb a travs del canal de parto.  La tensin muscular debido a la ansiedad y al nerviosismo.  La contraccin y la relajacin del tero para ayudar al paso del beb. Hay muchas formas de lidiar con el dolor del Ophirtrabajo de Robbinsparto y del parto. Estas incluyen las siguientes:  Tomar clases prenatales. Estas clases ayudan a que se sepa qu esperar durante el nacimiento del beb. Lo que se aprende aumentar la confianza y VF Corporationdisminuir la ansiedad.  Practicar tcnicas de relajacin o hacer actividades relajantes, por ejemplo: ? Respiracin localizada. ? Meditacin. ? Visualizacin. ? Aromaterapia. ? Designer, industrial/productscuchar la msica predilecta. ? Hipnosis.  Tomar una ducha o un bao con agua templada (hidroterapia). Esto puede lograr lo siguiente: ? Event organiserBrindar bienestar y relajacin. ? Disminuir la percepcin del dolor. ? Reducir la cantidad de analgsicos que se necesitan. ? Reducir de la duracin del trabajo de Graniteparto.  Recibir un masaje o contrapresin en la espalda.  Aplicar compresas calientes o de hielo.  Cambiar con frecuencia de posicin, moverse o usar una pelota de [place]parto.  Recibir lo siguiente: ? Analgsicos a travs de una va intravenosa (IV) o de una inyeccin intramuscular. ? Analgsicos que se administran en la columna vertebral. ? Inyecciones de agua estril que se aplican justo debajo de la piel en la parte baja de la espalda (inyecciones intradrmicas). ? Gas hilarante (protxido de nitrgeno). Comente  las opciones para el control del dolor con su mdico durante las visitas prenatales. Explore las opciones que ofrecen el hospital o la maternidad. QU TIPOS DE MEDICAMENTOS HAY DISPONIBLES? Hay dos tipos de medicamentos que se pueden usar para Engineer, materialsaliviar el dolor durante el [place]neytrabajo de parto y el parto:  Analgsicos. Estos medicamentos Associate Professoralivian el dolor sin causar la prdida de la sensibilidad ni de la capacidad de PACCAR Incmover los msculos.  Anestsicos. Estos medicamentos inhiben la sensibilidad del cuerpo y pueden reducir la capacidad de moverse con libertad. Estos dos tipos de medicamentos pueden producir efectos secundarios leves, como nuseas, dificultad para concentrarse y somnolencia. Tambin pueden disminuir la frecuencia cardaca del beb antes del nacimiento y afectar su frecuencia respiratoria despus de nacer. Por este motivo, los mdicos son cuidadosos respecto de cundo se administran los medicamentos y de su cantidad. CULES SON LOS MEDICAMENTOS Y LOS PROCEDIMIENTOS ESPECFICOS QUE ALIVIAN EL DOLOR? Anestesia local La anestesia local se [place] para adormecer una pequea zona del cuerpo. Se la puede usar junto con otro tipo de anestesia o emplearse para Frontier Oil Corporationadormecer los nervios de la vagina, del cuello del tero y del perineo durante el perodo expulsivo. Anestesia general La anestesia general produce la prdida del conocimiento de modo que no se siente dolor. Generalmente, solo se la [place] para una cesrea de Associate Professoremergencia. La anestesia general se administra a travs de una va intravenosa (IV) y de Earline Mayotteuna mscara. Bloqueo pudendo El bloqueo pudendo es una forma de anestesia local. Se puede utilizar para Acupuncturistaliviar el dolor asociado con la presin o la  distensin del perineo en el momento del parto, o para adormecer ms el perineo. Para realizar el bloqueo pudendo, se inyecta anestesia a travs de la pared vaginal en un nervio de la pelvis. Anestesia epidural La anestesia epidural se administra a travs de un  catter intravenoso flexible que se coloca en la parte baja de la espalda. La anestesia se administra de manera continua en la zona cercana a los nervios de la columna vertebral (espacio epidural). Despus de recibir este tipo de anestesia, tal vez se puedan mover las piernas, pero lo ms probable es que no se pueda caminar. En funcin de la cantidad de anestesia que se administra, se puede perder toda la sensibilidad en la mitad inferior del cuerpo, o bien se puede conservar cierto nivel de sensibilidad, incluida la necesidad de [place]townpujar. La anestesia epidural se puede usar para Engineer, materialsaliviar el dolor para un parto vaginal. Bloqueo espinal El bloqueo espinal es similar a la anestesia epidural, pero la anestesia se inyecta en el lquido cefalorraqudeo, en lugar del espacio epidural. El bloqueo espinal solo se aplica una vez. Comienza a Engineer, materialsaliviar el dolor rpidamente, pero el alivio solo dura de 1 a Landscape architect6horas. El bloqueo espinal se puede usar para las cesreas. Bloqueo espinal-epidural combinado El bloqueo espinal-epidural combinado combina los efectos del bloqueo espinal y de la anestesia epidural. Acta rpidamente para bloquear todo Chief Technology Officerel dolor. La anestesia epidural alivia el dolor de manera continua, incluso despus de que hayan desaparecido los efectos del bloqueo espinal. Esta informacin no tiene Theme park managercomo fin reemplazar el consejo del mdico. Asegrese de hacerle al mdico cualquier pregunta que tenga. Document Released: 01/07/2009 Document Revised: 03/17/2013 Document Reviewed: 08/03/2015 Elsevier Interactive Patient Education  [employer]2018 Elsevier Inc.

## 2017-02-22 ENCOUNTER — Ambulatory Visit (HOSPITAL_COMMUNITY)
Admission: RE | Admit: 2017-02-22 | Discharge: 2017-02-22 | Disposition: A | Discharge status: Home / Self Care | Payer: Self-pay | Source: Ambulatory Visit | Attending: Medical | Admitting: Medical

## 2017-02-22 DIAGNOSIS — O289 Unspecified abnormal findings on antenatal screening of mother: Secondary | ICD-10-CM | POA: Insufficient documentation

## 2017-02-22 DIAGNOSIS — O99213 Obesity complicating pregnancy, third trimester: Secondary | ICD-10-CM | POA: Insufficient documentation

## 2017-02-22 DIAGNOSIS — Z3400 Encounter for supervision of normal first pregnancy, unspecified trimester: Secondary | ICD-10-CM

## 2017-02-22 DIAGNOSIS — Z3A28 28 weeks gestation of pregnancy: Secondary | ICD-10-CM | POA: Insufficient documentation

## 2017-02-22 DIAGNOSIS — O409XX0 Polyhydramnios, unspecified trimester, not applicable or unspecified: Secondary | ICD-10-CM

## 2017-02-22 LAB — CBC
HEMATOCRIT: 35.3 % (ref 34.0–46.6)
Hemoglobin: 11.9 g/dL (ref 11.1–15.9)
MCH: 29 pg (ref 26.6–33.0)
MCHC: 33.7 g/dL (ref 31.5–35.7)
MCV: 86 fL (ref 79–97)
Platelets: 191 10*3/uL (ref 150–379)
RBC: 4.1 x10E6/uL (ref 3.77–5.28)
RDW: 14.4 % (ref 12.3–15.4)
WBC: 6.5 10*3/uL (ref 3.4–10.8)

## 2017-02-22 LAB — GLUCOSE TOLERANCE, 2 HOURS W/ 1HR
GLUCOSE, 1 HOUR: 160 mg/dL (ref 65–179)
GLUCOSE, FASTING: 74 mg/dL (ref 65–91)
Glucose, 2 hour: 122 mg/dL (ref 65–152)

## 2017-02-22 LAB — RPR: RPR Ser Ql: NONREACTIVE

## 2017-02-22 LAB — HIV ANTIBODY (ROUTINE TESTING W REFLEX): HIV Screen 4th Generation wRfx: NONREACTIVE

## 2017-02-27 ENCOUNTER — Telehealth: Payer: Self-pay | Admitting: General Practice

## 2017-02-27 DIAGNOSIS — O99210 Obesity complicating pregnancy, unspecified trimester: Principal | ICD-10-CM

## 2017-02-27 NOTE — Telephone Encounter (Signed)
Scheduled appts for 12/27 @ 1:45 and 12/7 @ 245. Called patient with pacific interpreter [phone], no answer on patient's number. Unable to leave VM as it had not been set up. Called patient at emergency contact number, no answer but left message to call office regarding an appointment scheduled for Friday.

## 2017-02-27 NOTE — Telephone Encounter (Signed)
-----   Message from Marny LowensteinJulie N Wenzel, PA-C sent at 02/25/2017  2:52 PM EST ----- Per MFM recommendations, patient needs AFI scheduled on Friday (12/7) and follow-up growth at the end of December. Please schedule and inform that patient since she will not be seen in [facility] until 12/12. Thanks!   Marny LowensteinWenzel, Julie N, PA-C 02/25/2017 2:52 PM

## 2017-03-01 ENCOUNTER — Ambulatory Visit (HOSPITAL_COMMUNITY)
Admission: RE | Admit: 2017-03-01 | Discharge: 2017-03-01 | Disposition: A | Discharge status: Home / Self Care | Payer: Self-pay | Source: Ambulatory Visit | Attending: Medical | Admitting: Medical

## 2017-03-01 ENCOUNTER — Other Ambulatory Visit: Payer: Self-pay | Admitting: Medical

## 2017-03-01 DIAGNOSIS — Z3A29 29 weeks gestation of pregnancy: Secondary | ICD-10-CM

## 2017-03-01 DIAGNOSIS — O4103X0 Oligohydramnios, third trimester, not applicable or unspecified: Secondary | ICD-10-CM | POA: Insufficient documentation

## 2017-03-01 DIAGNOSIS — O99213 Obesity complicating pregnancy, third trimester: Secondary | ICD-10-CM | POA: Insufficient documentation

## 2017-03-01 DIAGNOSIS — O99210 Obesity complicating pregnancy, unspecified trimester: Principal | ICD-10-CM

## 2017-03-06 ENCOUNTER — Ambulatory Visit (INDEPENDENT_AMBULATORY_CARE_PROVIDER_SITE_OTHER): Payer: Self-pay | Admitting: Obstetrics & Gynecology

## 2017-03-06 VITALS — BP 115/84 | HR 105 | Wt 268.3 lb

## 2017-03-06 DIAGNOSIS — Z3400 Encounter for supervision of normal first pregnancy, unspecified trimester: Secondary | ICD-10-CM

## 2017-03-06 DIAGNOSIS — Z3403 Encounter for supervision of normal first pregnancy, third trimester: Secondary | ICD-10-CM

## 2017-03-06 NOTE — Patient Instructions (Signed)

## 2017-03-06 NOTE — Progress Notes (Signed)
Spanish video interpreter "Mathis Farelbert" [phone] used for visit PHQ-9 is 10, pt declines to see [facility] today

## 2017-03-06 NOTE — Progress Notes (Signed)
   PRENATAL VISIT NOTE  Subjective:  Karen Barajas is a 26 y.o. G1P0000 at 7733w4d being seen today for ongoing prenatal care.  She is currently monitored for the following issues for this low-risk pregnancy and has Supervision of normal first pregnancy, antepartum and Maternal morbid obesity, antepartum (HCC) on their problem list.  Patient reports no complaints.  Contractions: Irritability. Vag. Bleeding: None.  Movement: Present. Denies leaking of fluid.   The following portions of the patient's history were reviewed and updated as appropriate: allergies, current medications, past family history, past medical history, past social history, past surgical history and problem list. Problem list updated.  Objective:   Vitals:   03/06/17 1536 03/06/17 1546  BP: 130/72 115/84  Pulse: (!) 105   Weight: 268 lb 4.8 oz (121.7 kg)     Fetal Status: Fetal Heart Rate (bpm): 145   Movement: Present     General:  Alert, oriented and cooperative. Patient is in no acute distress.  Skin: Skin is warm and dry. No rash noted.   Cardiovascular: Normal heart rate noted  Respiratory: Normal respiratory effort, no problems with respiration noted  Abdomen: Soft, gravid, appropriate for gestational age.  Pain/Pressure: Present     Pelvic: Cervical exam deferred        Extremities: Normal range of motion.  Edema: None  Mental Status:  Normal mood and affect. Normal behavior. Normal judgment and thought content.   Assessment and Plan:  Pregnancy: G1P0000 at 4633w4d  1. Supervision of normal first pregnancy, antepartum Will recheck BP in one week  Preterm labor symptoms and general obstetric precautions including but not limited to vaginal bleeding, contractions, leaking of fluid and fetal movement were reviewed in detail with the patient. Please refer to After Visit Summary for other counseling recommendations.  Return in about 1 week (around 03/13/2017).   Scheryl DarterJames Haidar Muse, MD

## 2017-03-13 ENCOUNTER — Ambulatory Visit: Payer: Self-pay | Admitting: General Practice

## 2017-03-13 VITALS — BP 105/58 | HR 105 | Ht 62.21 in | Wt 267.0 lb

## 2017-03-13 DIAGNOSIS — Z0130 Encounter for examination of blood pressure without abnormal findings: Secondary | ICD-10-CM

## 2017-03-13 NOTE — Telephone Encounter (Signed)
Pt has been seen, imaging is scheduled.

## 2017-03-13 NOTE — Progress Notes (Signed)
Patient here for BP check today following mildly elevated BP last week at MD visit.. Patient reports a headache yesterday, but nothing since then. BP WNL. Recommended to patient she follow up at scheduled OB visit next week. Patient verbalized understanding

## 2017-03-21 ENCOUNTER — Ambulatory Visit (INDEPENDENT_AMBULATORY_CARE_PROVIDER_SITE_OTHER): Payer: Self-pay | Admitting: Student

## 2017-03-21 ENCOUNTER — Ambulatory Visit (HOSPITAL_COMMUNITY)
Admission: RE | Admit: 2017-03-21 | Discharge: 2017-03-21 | Disposition: A | Discharge status: Home / Self Care | Payer: Self-pay | Source: Ambulatory Visit | Attending: Medical | Admitting: Medical

## 2017-03-21 ENCOUNTER — Other Ambulatory Visit: Payer: Self-pay | Admitting: Medical

## 2017-03-21 DIAGNOSIS — Z3A32 32 weeks gestation of pregnancy: Secondary | ICD-10-CM | POA: Insufficient documentation

## 2017-03-21 DIAGNOSIS — O99210 Obesity complicating pregnancy, unspecified trimester: Secondary | ICD-10-CM

## 2017-03-21 DIAGNOSIS — O99213 Obesity complicating pregnancy, third trimester: Secondary | ICD-10-CM | POA: Insufficient documentation

## 2017-03-21 DIAGNOSIS — O26849 Uterine size-date discrepancy, unspecified trimester: Secondary | ICD-10-CM

## 2017-03-21 DIAGNOSIS — O26843 Uterine size-date discrepancy, third trimester: Secondary | ICD-10-CM

## 2017-03-21 DIAGNOSIS — Z3400 Encounter for supervision of normal first pregnancy, unspecified trimester: Secondary | ICD-10-CM

## 2017-03-21 NOTE — Progress Notes (Addendum)
   PRENATAL VISIT NOTE  Subjective:  Karen Barajas is a 26 y.o. G1P0000 at 2826w5d being seen today for ongoing prenatal care.  She is currently monitored for the following issues for this low-risk pregnancy and has Supervision of normal first pregnancy, antepartum; Maternal morbid obesity, antepartum (HCC); and Significant discrepancy between uterine size and clinical dates, antepartum on their problem list.  Patient reports no complaints.  Contractions: Irritability. Vag. Bleeding: None.  Movement: Present. Denies leaking of fluid.   The following portions of the patient's history were reviewed and updated as appropriate: allergies, current medications, past family history, past medical history, past social history, past surgical history and problem list. Problem list updated.  Objective:   Vitals:   03/21/17 1331  BP: 107/73  Pulse: 90  Weight: 269 lb 8 oz (122.2 kg)    Fetal Status: Fetal Heart Rate (bpm): 132 Fundal Height: 36 cm Movement: Present     General:  Alert, oriented and cooperative. Patient is in no acute distress.  Skin: Skin is warm and dry. No rash noted.   Cardiovascular: Normal heart rate noted  Respiratory: Normal respiratory effort, no problems with respiration noted  Abdomen: Soft, gravid, appropriate for gestational age.  Pain/Pressure: Present     Pelvic: Cervical exam deferred        Extremities: Normal range of motion.  Edema: Trace  Mental Status:  Normal mood and affect. Normal behavior. Normal judgment and thought content.   Assessment and Plan:  Pregnancy: G1P0000 at 326w5d  1. Supervision of normal first pregnancy, antepartum Doing well. -BP normal today.  2. Significant discrepancy between uterine size and clinical dates, antepartum Patient scheduled for growth US today. (patient may have to reschedule bc [facility]. Emphasized to patient the importance of rescheduling).  Preterm labor symptoms and general obstetric  precautions including but not limited to vaginal bleeding, contractions, leaking of fluid and fetal movement were reviewed in detail with the patient. Please refer to After Visit Summary for other counseling recommendations.  Return in about 2 weeks (around 04/04/2017).   Karen Barajas, CNM

## 2017-03-21 NOTE — Patient Instructions (Signed)

## 2017-03-21 NOTE — Progress Notes (Signed)
Stratus interpreter Nadya [id number]   

## 2017-03-26 NOTE — L&D Delivery Note (Addendum)
Delivery Note Pt began pushing at approx 0415, and at 5:57 AM a viable female was delivered via Vaginal, Spontaneous (Presentation: ROA ).  APGAR: 3, 9; weight: pending .  No difficulty with delivery of shoulders. Thick mec noted at deliver. Infant dried, bulb suctioned, and placed on pt's abd; infant floppy, so cord clamped and cut by CNM and infant to warmer for resus eval. Placenta status: spont, intact .  Cord: 3 vessel; Cord pH: 7.268  Anesthesia:  Epidural Episiotomy: None Lacerations: 2nd degree;Perineal Suture Repair: 3.0 monocryl Est. Blood Loss (mL): 200  Mom to postpartum.  Baby to Couplet care / Skin to Skin.  [name], [name] CNM 05/16/2017, 6:40 AM  Please schedule this patient for Postpartum visit in: 4 weeks with the following provider: Any provider For C/S patients schedule nurse incision check in weeks 2 weeks: no Low risk pregnancy complicated by: BMI 52 Delivery mode:  SVD Anticipated Birth Control:  Other/unsure- patch when weans PP Procedures needed: none  Schedule Integrated BH visit: no

## 2017-04-01 ENCOUNTER — Encounter (HOSPITAL_COMMUNITY): Payer: Self-pay | Admitting: Emergency Medicine

## 2017-04-01 ENCOUNTER — Inpatient Hospital Stay (HOSPITAL_COMMUNITY)
Admission: AD | Admit: 2017-04-01 | Discharge: 2017-04-02 | Disposition: A | Discharge status: Home / Self Care | Payer: Self-pay | Source: Ambulatory Visit | Attending: Obstetrics and Gynecology | Admitting: Obstetrics and Gynecology

## 2017-04-01 DIAGNOSIS — O9A213 Injury, poisoning and certain other consequences of external causes complicating pregnancy, third trimester: Secondary | ICD-10-CM | POA: Insufficient documentation

## 2017-04-01 DIAGNOSIS — O4703 False labor before 37 completed weeks of gestation, third trimester: Secondary | ICD-10-CM

## 2017-04-01 DIAGNOSIS — R109 Unspecified abdominal pain: Secondary | ICD-10-CM | POA: Insufficient documentation

## 2017-04-01 DIAGNOSIS — O26899 Other specified pregnancy related conditions, unspecified trimester: Secondary | ICD-10-CM

## 2017-04-01 DIAGNOSIS — T7840XA Allergy, unspecified, initial encounter: Secondary | ICD-10-CM

## 2017-04-01 DIAGNOSIS — Z79899 Other long term (current) drug therapy: Secondary | ICD-10-CM | POA: Insufficient documentation

## 2017-04-01 DIAGNOSIS — X58XXXA Exposure to other specified factors, initial encounter: Secondary | ICD-10-CM | POA: Insufficient documentation

## 2017-04-01 DIAGNOSIS — Z3A34 34 weeks gestation of pregnancy: Secondary | ICD-10-CM | POA: Insufficient documentation

## 2017-04-01 DIAGNOSIS — O26893 Other specified pregnancy related conditions, third trimester: Secondary | ICD-10-CM | POA: Insufficient documentation

## 2017-04-01 LAB — URINALYSIS, ROUTINE W REFLEX MICROSCOPIC
BILIRUBIN URINE: NEGATIVE
Glucose, UA: NEGATIVE mg/dL
Hgb urine dipstick: NEGATIVE
Ketones, ur: NEGATIVE mg/dL
LEUKOCYTES UA: NEGATIVE
NITRITE: NEGATIVE
PH: 7 (ref 5.0–8.0)
Protein, ur: NEGATIVE mg/dL
SPECIFIC GRAVITY, URINE: 1.013 (ref 1.005–1.030)

## 2017-04-01 MED ORDER — NIFEDIPINE 10 MG PO CAPS: 10.0000 mg | ORAL_CAPSULE | Freq: Four times a day (QID) | ORAL | 0 refills | Status: DC | PRN

## 2017-04-01 MED ORDER — LACTATED RINGERS IV BOLUS (SEPSIS)
1000.0000 mL | Freq: Once | INTRAVENOUS | Status: AC
  Administered 2017-04-01: 1000 mL via INTRAVENOUS

## 2017-04-01 MED ORDER — CYCLOBENZAPRINE HCL 5 MG PO TABS
5.0000 mg | ORAL_TABLET | Freq: Once | ORAL | Status: AC
  Administered 2017-04-01: 5 mg via ORAL
  Filled 2017-04-01: qty 1

## 2017-04-01 MED ORDER — DIPHENHYDRAMINE HCL 50 MG/ML IJ SOLN
50.0000 mg | Freq: Once | INTRAMUSCULAR | Status: AC
  Administered 2017-04-01: 50 mg via INTRAVENOUS

## 2017-04-01 MED ORDER — DIPHENHYDRAMINE HCL 50 MG/ML IJ SOLN
INTRAMUSCULAR | Status: AC
  Administered 2017-04-01: 50 mg via INTRAVENOUS
  Filled 2017-04-01: qty 1

## 2017-04-01 NOTE — MAU Provider Note (Signed)
History     CSN: [id number]  Arrival date and time: 04/01/17 2047   First Provider Initiated Contact with Patient 04/01/17 2137     Chief Complaint  Patient presents with  . Contractions   HPI Karen Barajas is a 27 y.o. G1P0000 at 4011w2d who presents with contractions. She states the pain started at 2100 and has gotten progressively worse. She reports the pain a 7/10 and has not tried anything for the pain. She denies any vaginal bleeding, leaking of fluid or discharge. Reports good fetal movement. [facility] and denies any problems during this pregnancy.   OB History    Gravida Para Term Preterm AB Living   1 0 0 0 0 0   SAB TAB Ectopic Multiple Live Births   0 0 0 0 0      Past Medical History:  Diagnosis Date  . UTI (lower urinary tract infection)     Past Surgical History:  Procedure Laterality Date  . NO PAST SURGERIES      Family History  Problem Relation Age of Onset  . Diabetes Maternal Grandmother   . Hyperlipidemia Maternal Grandfather   . Cancer Paternal Grandfather     Social History   Tobacco Use  . Smoking status: Never Smoker  . Smokeless tobacco: Never Used  Substance Use Topics  . Alcohol use: No  . Drug use: No    Allergies: No Known Allergies  Medications Prior to Admission  Medication Sig Dispense Refill Last Dose  . acetaminophen (TYLENOL) 500 MG tablet Take 500 mg by mouth every 6 (six) hours as needed.   Taking  . Prenatal Vit-Fe Fumarate-FA (MULTIVITAMIN-PRENATAL) 27-0.8 MG TABS tablet Take 1 tablet by mouth daily at 12 noon.   Taking    Review of Systems  Constitutional: Negative.  Negative for fatigue and fever.  HENT: Negative.   Respiratory: Negative.  Negative for shortness of breath.   Cardiovascular: Negative.  Negative for chest pain.  Gastrointestinal: Positive for abdominal pain. Negative for constipation, diarrhea, nausea and vomiting.  Genitourinary: Negative.  Negative for dysuria,  vaginal bleeding and vaginal discharge.  Neurological: Negative.  Negative for dizziness and headaches.   Physical Exam   Blood pressure 108/76, pulse (!) 102, temperature 97.8 F (36.6 C), temperature source Oral, resp. rate 18, height 5' 1.04" (1.55 m), weight 247 lb 4 oz (112.2 kg), last menstrual period 06/25/2016, SpO2 99 %.  Physical Exam  Nursing note and vitals reviewed. Constitutional: She is oriented to person, place, and time. She appears well-developed and well-nourished. No distress.  HENT:  Head: Normocephalic.  Eyes: Pupils are equal, round, and reactive to light.  Cardiovascular: Normal rate, regular rhythm and normal heart sounds.  Respiratory: Effort normal and breath sounds normal. No respiratory distress.  GI: Soft. Bowel sounds are normal. She exhibits no distension. There is no tenderness.  Neurological: She is alert and oriented to person, place, and time.  Skin: Skin is warm and dry.  Psychiatric: She has a normal mood and affect. Her behavior is normal. Judgment and thought content normal.   Dilation: Closed Effacement (%): 50 Cervical Position: Middle Station: -2 Exam by:: Jahlon Baines, cnm   Fetal Tracing:  Baseline: 130 Variability: moderate Accels: 15x15 Decels: none  Toco: 5-6  MAU Course  Procedures Results for orders placed or performed during the hospital encounter of 04/01/17 (from the past 24 hour(s))  Urinalysis, Routine w reflex microscopic     Status:  None   Collection Time: 04/01/17  8:58 PM  Result Value Ref Range   Color, Urine YELLOW YELLOW   APPearance CLEAR CLEAR   Specific Gravity, Urine 1.013 1.005 - 1.030   pH 7.0 5.0 - 8.0   Glucose, UA NEGATIVE NEGATIVE mg/dL   Hgb urine dipstick NEGATIVE NEGATIVE   Bilirubin Urine NEGATIVE NEGATIVE   Ketones, ur NEGATIVE NEGATIVE mg/dL   Protein, ur NEGATIVE NEGATIVE mg/dL   Nitrite NEGATIVE NEGATIVE   Leukocytes, UA NEGATIVE NEGATIVE   MDM UA LR bolus Flexeril 5mg   PO  After administration of flexeril, patient became short of breath and complaining of chest tightness and itching in her throat. 50mg  IV benedryl given and Dr. Vergie Living at bedside. Patient reported some relief with benedryl but still felt tightness in her chest. Will infuse fluids and obtain EKG.  EKG- normal sinus rhythm No cervical change over 2 hours, patient reports relief from pain and throat feels better Reviewed with Dr. Vergie Living- ok to discharge patient home with prescription for procardia Added flexeril to allergy list  Assessment and Plan   1. Threatened premature labor in third trimester   2. Abdominal pain affecting pregnancy   3. [redacted] weeks gestation of pregnancy   4. Allergic reaction to drug, initial encounter    -Discharge home in stable condition -Rx for procardia 10mg  q6 hrs PRN given to patient -Preterm labor and allergic reaction precautions discussed -Patient advised to [facility]-up with [facility] as scheduled for prenatal care -Patient may return to MAU as needed or if her condition were to change or worsen  Rolm Bookbinder CNM 04/01/2017, 9:44 PM   Allergies as of 04/01/2017      Reactions   Flexeril [cyclobenzaprine] Itching   Itchy dry throat, chest tightness      Medication List    TAKE these medications   acetaminophen 500 MG tablet Commonly known as:  TYLENOL Take 500 mg by mouth every 6 (six) hours as needed.   multivitamin-prenatal 27-0.8 MG Tabs tablet Take 1 tablet by mouth daily at 12 noon.   NIFEdipine 10 MG capsule Commonly known as:  PROCARDIA Take 1 capsule (10 mg total) by mouth every 6 (six) hours as needed.

## 2017-04-01 NOTE — MAU Note (Signed)
2306 Pt states that her throat and chest are a little better.  O2 sat 100%  BP 110/53 HR 83

## 2017-04-01 NOTE — Discharge Instructions (Signed)
Dolor abdominal durante el embarazo (Abdominal Pain During Pregnancy) El dolor de vientre (abdominal) es habitual durante el embarazo. Generalmente no se trata de un problema grave. Otras veces puede ser un signo de que algo no anda bien. Siempre comunquese con su mdico si tiene dolor abdominal. CUIDADOS EN EL HOGAR Controle el dolor para ver si hay cambios. Las indicaciones que siguen pueden ayudarla a sentirse mejor:  Optician, dispensing (relaciones sexuales) ni se coloque nada dentro de la vagina hasta que se sienta mejor.  Haga reposo hasta que el dolor se calme.  Si siente ganas de vomitar (nuseas ) beba lquidos claros. No consuma alimentos slidos hasta que se sienta mejor.  Slo tome los medicamentos que le haya indicado su mdico.  Cumpla con las visitas al mdico segn las indicaciones. SOLICITE AYUDA DE INMEDIATO SI:  Tiene un sangrado, pierde lquido o elimina trozos de tejido por la vagina.  Siente ms dolor o clicos.  Comienza a vomitar.  Siente dolor al orinar u observa sangre en la orina.  Tiene fiebre.  No siente que el beb se mueva mucho.  Se siente muy dbil o cree que va a desmayarse.  Tiene dificultad para respirar con o sin dolor en el vientre.  Siente un dolor de cabeza muy intenso y Social research officer, government en el vientre.  Observa que sale un lquido por la vagina y tiene dolor abdominal.  La materia fecal es lquida (diarrea).  El dolor en el viente no desaparece, o empeora, luego de hacer reposo. ASEGRESE DE QUE:  Comprende estas instrucciones.  Controlar su afeccin.  Recibir ayuda de inmediato si no mejora o si empeora. Esta informacin no tiene Marine scientist el consejo del mdico. Asegrese de hacerle al mdico cualquier pregunta que tenga. Document Released: 11/22/2010 Document Revised: 07/04/2015 Document Reviewed: 10/09/2012 Elsevier Interactive Patient Education  2018 [place] a los medicamentos (Drug Allergy) La alergia a los  medicamentos ocurre cuando el sistema de defensa del organismo, encargado de Restaurant manager, fast food contra las enfermedades (sistema inmunitario) reacciona mal a un medicamento. Las [employer] a los medicamentos varan de leves a graves y pueden ser potencialmente mortales en [employer]. Algunas reacciones alrgicas ocurren una semana o ms despus de haber estado expuesto a un medicamento (reaccin tarda). Ardelia Mems reaccin anafilctica (anafilaxia) es una reaccin alrgica grave y repentina (aguda) que afecta a mltiples zonas del cuerpo. La anafilaxia puede ser potencialmente mortal. Las reacciones alrgicas a un medicamento requieren una evaluacin mdica, incluso si una reaccin alrgica parece ser leve (menor). CAUSAS Las Medtronic a los medicamentos suceden cuando el sistema inmunitario identifica equivocadamente a un medicamento como nocivo. Cuando esto sucede, el organismo libera protenas (anticuerpos) y otros componentes, como la histamina, en el torrente sanguneo. Esto provoca la hinchazn de ciertos tejidos y la disminucin de la presin arterial en zonas importantes, como el corazn y los pulmones. Casi todos los [employer] pueden provocar una Risk analyst. Los medicamentos que con ms frecuencia causan Chief of Staff (son alrgenos comunes) incluyen los siguientes:  Penicilina.  Medicamentos sulfa (sulfonamidas).  Medicamentos que adormecen ciertas zonas del cuerpo (anestsicos locales).  Colorantes de radiografas que contienen yodo. SNTOMAS Reaccin alrgica leve  Congestin nasal.  Hormigueo en la boca.  Erupcin cutnea de color rojo que causa picazn. Reaccin alrgica grave  Hinchazn de los ojos, los labios, el rostro o la [place].  Hinchazn de la parte posterior de la boca y la garganta.  Sibilancias.  Voz ronca.  Zonas de piel hinchadas, rojas y que producen  picazn (ronchas).  Mareos o sensacin de desvanecimiento.  Desmayos.  Ansiedad o confusin.  Dolor  abdominal.  Dificultad para respirar, para hablar o para tragar.  Opresin en el pecho.  Latidos cardacos acelerados o irregulares (palpitaciones).  Vmitos.  Diarrea. DIAGNSTICO Esta afeccin se diagnostica mediante un examen fsico y los antecedentes de exposicin reciente a uno o ms medicamentos. Podr ser derivado a un mdico especialista en alergias para que le realice estudios de control. Estos estudios pueden confirmar el diagnstico de una alergia a un medicamento y Teacher, adult education a qu medicamentos es Air cabin crew. Los estudios pueden incluir lo siguiente:  Pruebas cutneas. Estas pueden implicar lo siguiente: ? Inyectar una pequea cantidad de los probables alrgenos entre las capas de la piel (inyeccin intradrmica). ? Aplicacin de parches en la piel.  Anlisis de [place].  Prueba de exposicin a medicamentos. En [employer], el mdico le administra una pequea cantidad de un medicamento en dosis graduales mientras observa si hay alguna reaccin alrgica. Si no est seguro de lo que Psychiatric nurse, Product/process development scientist lo siguiente:  Informacin sobre todos los medicamentos que toma regularmente, por ejemplo: ? El nombre de cada medicamento. ? La cantidad (dosis) y la frecuencia en que toma cada medicamento por da. ? La forma de cada medicamento, como por ejemplo, comprimido, lquido o inyeccin.  La fecha y hora de la reaccin. TRATAMIENTO No hay cura para las alergias. Sin embargo, Nurse, mental health se puede tratar con lo siguiente:  Medicamentos para lo siguiente: ? Dietitian y la hinchazn Dayna Ramus). ? Aliviar la picazn y la urticaria (antihistamnicos). ? Reducir la hinchazn (corticoides).  Inhaladores. Estos son medicamentos que se inhalan para ampliar (dilatar) las vas respiratorias de los pulmones.  Inyecciones de medicamentos que ayudan a [employer] de las vas respiratorias y Museum/gallery curator los vasos sanguneos  (epinefrina). Las Chief of Staff graves, como la [place], requieren tratamiento inmediato en un hospital. Si tiene una reaccin anafilctica, podrn hospitalizarlo para su observacin. Podrn indicarle medicamentos de rescate, como la epinefrina. La epinefrina tiene distintas presentaciones que incluyen lo que frecuentemente se denomina "Pensions consultant (dispositivo automtico precargado para inyectar epinefrina). INSTRUCCIONES PARA EL CUIDADO EN EL HOGAR Si tiene una alergia grave  Siempre tenga a mano un lpiz autoinyector o un kit anafilctico. Esto puede salvarle la vida si tiene una reaccin grave. Use su lpiz autoinyector o Water quality scientist se lo haya indicado el mdico.  Asegrese de que usted, quines vivan con usted y su empleador sepan hacer lo siguiente: ? Administrator, Civil Service de anafilaxia. ? Cmo usar el lpiz autoinyector para aplicarse una inyeccin de epinefrina.  Reponga la epinefrina inmediatamente despus de usar su Civil engineer, contracting, en caso de volver a Special educational needs teacher reaccin en el futuro.  Lleve un brazalete o collar de alerta mdica que informe sobre su alergia a un medicamento, si se lo indica el mdico. Otras indicaciones  Evite los medicamentos a los cuales es Air cabin crew.  [employer] medicamentos de venta libre y los recetados solamente como se lo haya indicado el mdico.  Si le indican medicamentos para tratar la reaccin, no conduzca un vehculo hasta que el mdico lo autorice.  Si tiene ronchas o una erupcin cutnea: ? Use un antihistamnico de venta libre como se lo haya indicado el mdico. ? Aplquese compresas mojadas con agua fra sobre la piel, o tome baos o duchas de agua fra. Evite el agua caliente.  Si le realizaron [place], es su responsabilidad retirar  los [place]. Pregntele al mdico cundo estarn [employer].  Informe a todos los mdicos que lo atienden que usted tiene [place] a Ecologist.  Concurra a todas las  visitas de control como se lo haya indicado el mdico. Esto es importante. SOLICITE ATENCIN MDICA SI:  Piensa que tiene Nurse, mental health. Generalmente, los sntomas de una reaccin alrgica comienzan en un lapso de 30 minutos despus de haber estado expuesto a un medicamento.  Tiene sntomas que duran ms de 2 das despus de la reaccin.  Los sntomas empeoran.  Presenta nuevos sntomas. SOLICITE ATENCIN MDICA DE INMEDIATO SI:  Tuvo que usar epinefrina. ? Una inyeccin de epinefrina puede ayudar a Primary school teacher que son potencialmente mortales, pero an as debe acudir a Oncologist de emergencia incluso si la epinefrina parece [employer]. Esto es importante ya que la anafilaxia puede suceder otra vez en el transcurso de 72 horas (anafilaxia de rebote). ? Si [place] epinefrina para tratar la anafilaxia fuera del hospital, necesita atencin mdica adicional. Esto podra incluir ms dosis de epinefrina.  Tiene sntomas de Nurse, mental health grave. Estos sntomas pueden representar un problema grave que constituye Engineer, maintenance (IT). No espere hasta que los sntomas desaparezcan. Use su lpiz autoinyector o kit de Pharmacologist se lo hayan indicado y [place] asistencia mdica de inmediato. Comunquese con el servicio de emergencias de su localidad (911en los [place]). No conduzca por sus propios medios [employer]. Esta informacin no tiene Marine scientist el consejo del mdico. Asegrese de hacerle al mdico cualquier pregunta que tenga. Document Released: 01/07/2007 Document Revised: 07/04/2015 Document Reviewed: 10/12/2014 Elsevier Interactive Patient Education  [employer].

## 2017-04-01 NOTE — MAU Note (Addendum)
Gave pt flexeril @ 2211. @ 2224 pt started to c/o of itchy throat/ dry then her chest began to hurt and feel tight. Pt began to cough and her o2 saturation went from 99% to 96%. @ 2225 I called for help.

## 2017-04-01 NOTE — MAU Note (Signed)
Pt c/o contractions that started @ 2100.  Denies LOF & bleeding. Denies burning w/ urination. +FM

## 2017-04-04 ENCOUNTER — Ambulatory Visit (INDEPENDENT_AMBULATORY_CARE_PROVIDER_SITE_OTHER): Payer: Self-pay | Admitting: Student

## 2017-04-04 VITALS — BP 100/72 | HR 100 | Wt 271.0 lb

## 2017-04-04 DIAGNOSIS — O26849 Uterine size-date discrepancy, unspecified trimester: Secondary | ICD-10-CM

## 2017-04-04 DIAGNOSIS — Z3493 Encounter for supervision of normal pregnancy, unspecified, third trimester: Secondary | ICD-10-CM

## 2017-04-04 NOTE — Progress Notes (Signed)
Pt stated having cold and taking tylenol for ear ache.

## 2017-04-05 NOTE — Progress Notes (Signed)
Patient ID: Loman ChromanSelene Casillas Parra, female   DOB: [birth year], 27 y.o.   MRN: [id number]   PRENATAL VISIT NOTE  Subjective:  Karen Barajas is a 27 y.o. G1P0000 at 3262w6d being seen today for ongoing prenatal care.  She is currently monitored for the following issues for this low-risk pregnancy and has Supervision of normal first pregnancy, antepartum; Maternal morbid obesity, antepartum (HCC); and Significant discrepancy between uterine size and clinical dates, antepartum on their problem list.  Patient reports coughing and ear pain for two days. Denies fever, chills, muscle aches. .  Contractions: Irritability. Vag. Bleeding: None.  Movement: Present. Denies leaking of fluid.   The following portions of the patient's history were reviewed and updated as appropriate: allergies, current medications, past family history, past medical history, past social history, past surgical history and problem list. Problem list updated.  Objective:   Vitals:   04/04/17 1515  BP: 100/72  Pulse: 100  Weight: 271 lb (122.9 kg)    Fetal Status: Fetal Heart Rate (bpm): 150 Fundal Height: 41 cm Movement: Present     General:  Alert, oriented and cooperative. Patient is in no acute distress.  Skin: Skin is warm and dry. No rash noted.   Cardiovascular: Normal heart rate noted  Respiratory: Normal respiratory effort, no problems with respiration noted  Abdomen: Soft, gravid, appropriate for gestational age.  Pain/Pressure: Present     Pelvic: Cervical exam deferred        Extremities: Normal range of motion.  Edema: None  Mental Status:  Normal mood and affect. Normal behavior. Normal judgment and thought content.   Assessment and Plan:  Pregnancy: G1P0000 at 762w6d  1. Significant discrepancy between uterine size and clinical dates, antepartum Needs repeat growth scan - US MFM OB FOLLOW UP; Future  2. Supervision of normal pregnancy -Reviewed safe medications in pregnancy for cold. Patient will  call for work-in appointment if she is not feeling better by next Monday.   Preterm labor symptoms and general obstetric precautions including but not limited to vaginal bleeding, contractions, leaking of fluid and fetal movement were reviewed in detail with the patient. Please refer to After Visit Summary for other counseling recommendations.  Return in about 2 weeks (around 04/18/2017).   Marylene LandKathryn Lorraine Kooistra, CNM

## 2017-04-11 ENCOUNTER — Ambulatory Visit (INDEPENDENT_AMBULATORY_CARE_PROVIDER_SITE_OTHER): Payer: Self-pay | Admitting: Student

## 2017-04-11 VITALS — BP 122/49 | HR 72 | Wt 274.1 lb

## 2017-04-11 DIAGNOSIS — O1203 Gestational edema, third trimester: Secondary | ICD-10-CM | POA: Insufficient documentation

## 2017-04-11 DIAGNOSIS — Z3400 Encounter for supervision of normal first pregnancy, unspecified trimester: Secondary | ICD-10-CM

## 2017-04-11 DIAGNOSIS — O26849 Uterine size-date discrepancy, unspecified trimester: Secondary | ICD-10-CM

## 2017-04-11 DIAGNOSIS — O99210 Obesity complicating pregnancy, unspecified trimester: Secondary | ICD-10-CM

## 2017-04-11 NOTE — Patient Instructions (Signed)
Preeclampsia y eclampsia °(Preeclampsia and Eclampsia) °La preeclampsia es una afección grave que solo se manifiesta durante el embarazo. También se la conoce como toxemia del embarazo. Esta afección provoca el aumento de la presión arterial junto con otros síntomas, como hinchazón y dolores de cabeza. Estos síntomas pueden aparecer a medida que la afección empeora. La preeclampsia puede presentarse a las 20 semanas de gestación o posteriormente. °El diagnóstico y el tratamiento tempranos de esta afección son muy importantes. Si no se la trata de inmediato, puede causarles problemas graves a usted y al bebé. Una complicación que puede provocar es la eclampsia, una afección que causa temblores o contracciones musculares (convulsiones) en la madre. Provocar el parto del bebé es el mejor tratamiento para la preeclampsia o la eclampsia. La preeclampsia y la eclampsia por lo general, desaparecen después del nacimiento del bebé. °CAUSAS °La causa de la preeclampsia no se conoce. °FACTORES DE RIESGO °Los siguientes factores pueden hacer que usted sea propensa a tener preeclampsia: °· Estar embarazada por primera vez. °· Haber tenido preeclampsia durante un embarazo anterior. °· Tener antecedentes familiares de preeclampsia. °· Tener presión arterial alta. °· Estar embarazada de gemelos o trillizos. °· Tener 35 años o más. °· Ser de raza afroamericana. °· Tener enfermedad renal o diabetes. °· Tener enfermedades, como lupus o enfermedades de la sangre. °· Tener mucho sobrepeso (obesidad). °SÍNTOMAS °Los primeros signos de preeclampsia son: °· Hipertensión arterial. °· Aumento de las proteínas en la orina. El médico la controlará en cada visita prenatal. °Otros síntomas que pueden aparecer a medida que la afección empeora incluyen: °· Dolor de cabeza intenso. °· Aumento repentino de peso. °· Hinchazón de las manos, del rostro, de las piernas y de los pies. °· Náuseas y vómitos. °· Problemas visuales, como visión doble o  borrosa. °· Adormecimiento del rostro, de los brazos, de las piernas y de los pies. °· Orinar mucho menos que lo habitual. °· Mareos. °· Hablar arrastrando las palabras. °· Dolor abdominal, especialmente en la zona superior del abdomen. °· Presenta convulsiones. °Los síntomas generalmente comienzan a desaparecer después del parto. °DIAGNÓSTICO °No hay pruebas diagnósticas para la preeclampsia. El médico le hará preguntas sobre los síntomas y verificará la presencia de signos de preeclampsia durante las visitas prenatales. También pueden hacerle otros estudios que incluyen: °· Análisis de orina. °· Análisis de sangre. °· Control de la presión arterial. °· Control de la frecuencia cardíaca del bebé. °· Ecografía. °TRATAMIENTO °Usted junto con su médico podrán determinar el mejor tratamiento. El tratamiento puede incluir lo siguiente: °· Si tiene un riesgo más alto de tener preeclampsia, tal vez necesite exámenes prenatales con más frecuencia. °· Reposo en cama. °· Reducir la cantidad de sal (sodio) que consume. °· Medicamentos para bajar la presión arterial. °· Permanecer en el hospital si la afección es grave. Allí, el tratamiento estará centrado en el control de la presión arterial y la cantidad de líquidos en el cuerpo (retención de líquidos). °· Puede ser que necesite tomar medicamentos (sulfato de magnesio) para prevenir las convulsiones. El medicamento se puede administrar como una inyección o por vía intravenosa (IV). °· Si su afección empeora, es posible que deba dar a luz antes de tiempo. Pueden provocarle el trabajo de parto con medicamentos (inducirse) o hacerle una cesárea. °INSTRUCCIONES PARA EL CUIDADO EN EL HOGAR °Comida y bebida °· Beba suficiente líquido para mantener la orina clara o de color amarillo pálido. °· Mantenga una dieta saludable con bajo contenido de sodio. No agregue sal a las comidas. Verifique las   etiquetas de los alimentos para conocer cuánto sodio hay en una comida o una  bebida. °· Evite la cafeína. °Estilo de vida °· No consuma ningún producto que contenga nicotina o tabaco, como cigarrillos y cigarrillos electrónicos. Si necesita ayuda para dejar de fumar, consulte al médico. °· No consuma drogas ni alcohol. °· Evite las situaciones estresantes tanto como sea posible. Haga reposo y duerma bien. °Instrucciones generales °· Tome los medicamentos de venta libre y los recetados solamente como se lo haya indicado el médico. °· Acuéstese de lado mientras hace reposo. De este modo, se quita la presión del bebé. °· Eleve las piernas mientras esté sentada o acostada. Intente colocar algunas almohadas debajo de las pantorrillas. °· Haga ejercicios regularmente. Consulte al médico qué tipos de ejercicios son seguros para usted. °· Concurra a todas las visitas de control prenatales y de seguimiento como se lo haya indicado el médico. Esto es importante. °PREVENCIÓN °Para prevenir la preeclampsia o la eclampsia en otro embarazo: °· Solicite atención médica adecuada durante el embarazo. El médico puede prevenir la preeclampsia o diagnosticarla y tratarla de manera temprana. °· El médico puede indicarle una dosis baja de aspirina o de suplemento de calcio para el próximo embarazo. °· Es posible que le hagan estudios de la presión arterial y el funcionamiento de los riñones después del parto. °· Mantenga un peso saludable. Pídale al médico que la ayude a controlar su peso durante del embarazo. °· Trabaje con el médico para controlar otros problemas de salud a largo plazo (crónicos) que tenga, como diabetes o problemas renales. °SOLICITE ATENCIÓN MÉDICA SI: °· Aumenta de peso más de lo esperado. °· Tiene dolores de cabeza. °· Tiene náuseas o vómitos. °· Siente dolor abdominal. °· Se siente mareada o que va a desvanecerse. °SOLICITE ATENCIÓN MÉDICA DE INMEDIATO SI: °· Aparece una hinchazón repentina o tiene una zona muy hinchada en cualquier parte del cuerpo. Esto suele ocurrir en las  piernas. °· Aumenta más de 5 libras (2,3 kg) o más en una semana. °· Siente de forma intensa: °? Dolor abdominal. °? Dolores de cabeza. °? Mareos. °? Problemas de visión. °? Confusión. °? Náuseas o vómitos. °· Tiene una convulsión. °· Tiene dificultad para mover cualquier parte del cuerpo. °· Siente adormecimiento en alguna parte del cuerpo. °· Tiene dificultad para hablar. °· Tiene cualquier sangrado anormal. °· Se desmaya. °Esta información no tiene como fin reemplazar el consejo del médico. Asegúrese de hacerle al médico cualquier pregunta que tenga. °Document Released: 12/20/2004 Document Revised: 07/04/2015 Document Reviewed: 10/17/2015 °Elsevier Interactive Patient Education © 2018 Elsevier Inc. ° °

## 2017-04-11 NOTE — Progress Notes (Signed)
Feels likes she has edema in face, feet,

## 2017-04-11 NOTE — Progress Notes (Signed)
   PRENATAL VISIT NOTE  Subjective:  Karen Barajas is a 27 y.o. G1P0000 at 55w5dbeing seen today for ongoing prenatal care.  She is currently monitored for the following issues for this low-risk pregnancy and has Supervision of normal first pregnancy, antepartum; Maternal morbid obesity, [place]; Significant discrepancy between uterine size and clinical dates, antepartum; and Edema during pregnancy in third trimester on their problem list.  Patient reports feeling like her hands and face are swollen in the mornings. She states it goes away throughout the day. She says that she has no headaches, blurry vision or epigastric pain. .  Contractions: Irregular. Vag. Bleeding: None.  Movement: Present. Denies leaking of fluid.   The following portions of the patient's history were reviewed and updated as appropriate: allergies, current medications, past family history, past medical history, past social history, past surgical history and problem list. Problem list updated.  Objective:   Vitals:   04/11/17 1503  BP: (!) 122/49  Pulse: 72  Weight: 274 lb 1.6 oz (124.3 kg)    Fetal Status: Fetal Heart Rate (bpm): 148 Fundal Height: 40 cm Movement: Present     General:  Alert, oriented and cooperative. Patient is in no acute distress.  Skin: Skin is warm and dry. No rash noted.   Cardiovascular: Normal heart rate noted  Respiratory: Normal respiratory effort, no problems with respiration noted  Abdomen: Soft, gravid, appropriate for gestational age.  Pain/Pressure: Present     Pelvic: Cervical exam deferred        Extremities: Normal range of motion.  Edema: Trace  Mental Status:  Normal mood and affect. Normal behavior. Normal judgment and thought content.   Assessment and Plan:  Pregnancy: G1P0000 at 375w5d1. Supervision of normal first pregnancy, antepartum -Reviewed warning signs of pre-eclampsia, reassured her that her BP is normal so unlikely to be developing pre-e but  will draw labs in case.  - CBC - Comp Met (CMET) - Protein / creatinine ratio, urine  2. Maternal morbid obesity, [place]. Significant discrepancy between uterine size and clinical dates, antepartum [place] end [place]  4. Edema during pregnancy in third trimester Encouraged hydration, exercise  Preterm labor symptoms and general obstetric precautions including but not limited to vaginal bleeding, contractions, leaking of fluid and fetal movement were reviewed in detail with the patient. Please refer to After Visit Summary for other counseling recommendations.  Return in about 1 week (around 04/18/2017), or LROB.   KaStarr LakeCNM

## 2017-04-13 LAB — COMPREHENSIVE METABOLIC PANEL
A/G RATIO: 1.4 (ref 1.2–2.2)
ALT: 17 IU/L (ref 0–32)
AST: 19 IU/L (ref 0–40)
Albumin: 3.5 g/dL (ref 3.5–5.5)
Alkaline Phosphatase: 157 IU/L — ABNORMAL HIGH (ref 39–117)
BUN/Creatinine Ratio: 15 (ref 9–23)
BUN: 6 mg/dL (ref 6–20)
Bilirubin Total: <0.2 mg/dL (ref 0.0–1.2)
CHLORIDE: 109 mmol/L — AB (ref 96–106)
CO2: 16 mmol/L — ABNORMAL LOW (ref 20–29)
Calcium: 9.3 mg/dL (ref 8.7–10.2)
Creatinine, Ser: 0.41 mg/dL — ABNORMAL LOW (ref 0.57–1.00)
GFR calc non Af Amer: 143 mL/min/{1.73_m2} (ref >59)
GFR, EST AFRICAN AMERICAN: 165 mL/min/{1.73_m2} (ref >59)
GLOBULIN, TOTAL: 2.5 g/dL (ref 1.5–4.5)
Glucose: 84 mg/dL (ref 65–99)
POTASSIUM: 4.7 mmol/L (ref 3.5–5.2)
SODIUM: 142 mmol/L (ref 134–144)
TOTAL PROTEIN: 6 g/dL (ref 6.0–8.5)

## 2017-04-13 LAB — CBC
Hematocrit: 39.1 % (ref 34.0–46.6)
Hemoglobin: 13 g/dL (ref 11.1–15.9)
MCH: 28.8 pg (ref 26.6–33.0)
MCHC: 33.2 g/dL (ref 31.5–35.7)
MCV: 87 fL (ref 79–97)
PLATELETS: 184 10*3/uL (ref 150–379)
RBC: 4.51 x10E6/uL (ref 3.77–5.28)
RDW: 14.2 % (ref 12.3–15.4)
WBC: 6.5 10*3/uL (ref 3.4–10.8)

## 2017-04-13 LAB — PROTEIN / CREATININE RATIO, URINE
Creatinine, Urine: 213.9 mg/dL
PROTEIN/CREAT RATIO: 183 mg/g{creat} (ref 0–200)
Protein, Ur: 39.1 mg/dL

## 2017-04-18 ENCOUNTER — Ambulatory Visit (INDEPENDENT_AMBULATORY_CARE_PROVIDER_SITE_OTHER): Payer: Self-pay | Admitting: Student

## 2017-04-18 VITALS — BP 135/81 | HR 108 | Wt 275.6 lb

## 2017-04-18 DIAGNOSIS — Z3403 Encounter for supervision of normal first pregnancy, third trimester: Secondary | ICD-10-CM

## 2017-04-18 DIAGNOSIS — Z3400 Encounter for supervision of normal first pregnancy, unspecified trimester: Secondary | ICD-10-CM

## 2017-04-18 DIAGNOSIS — O26843 Uterine size-date discrepancy, third trimester: Secondary | ICD-10-CM

## 2017-04-18 DIAGNOSIS — Z113 Encounter for screening for infections with a predominantly sexual mode of transmission: Secondary | ICD-10-CM

## 2017-04-18 DIAGNOSIS — O26849 Uterine size-date discrepancy, unspecified trimester: Secondary | ICD-10-CM

## 2017-04-18 DIAGNOSIS — O1203 Gestational edema, third trimester: Secondary | ICD-10-CM

## 2017-04-18 LAB — OB RESULTS CONSOLE GBS: GBS: POSITIVE

## 2017-04-18 NOTE — Patient Instructions (Signed)
Parto vaginal Vaginal Delivery Parto vaginal significa que usted dar a luz empujando al beb fuera del canal del parto (vagina). Un equipo de proveedores de atencin mdica la ayudar antes, durante y despus del parto vaginal. Las experiencias de los nacimientos son nicas para todas las mujeres y cada embarazo y las experiencias de nacimiento varan segn dnde elija dar a luz. Qu debo hacer a fin de prepararme para el nacimiento de mi beb? Antes del nacimiento de su beb, es importante que hable con su mdico sobre lo siguiente:  Sus preferencias en cuanto al trabajo de parto y parto. Estas pueden incluir lo siguiente: ? Medicamentos que le puedan administrar. ? Cmo controlar el dolor. Esto podra incluir tcnicas de alivio del dolor no mdicas o medicamentos inyectables para aliviar el dolor como la analgesia epidural. ? Cmo se los controlar a usted y a su beb durante el trabajo de parto y el parto. ? Quin puede estar en la sala de trabajo de parto y parto con usted. ? Sus opiniones en cuanto al parto quirrgico de su beb (parto por cesrea o cesrea) si esto fuera necesario. ? Sus opiniones en cuanto a recibir sangre donada a travs de un tubo (catter) intravenoso (transfusin de sangre) si esto fuera necesario.  Si usted puede: ? Tomar fotografas o videos del nacimiento. ? Comer durante el trabajo de parto y el parto. ? Moverse, caminar o cambiar de posicin durante el trabajo de parto y el parto.  Qu esperar despus del nacimiento de su beb, como: ? Si se ofrece el pinzamiento y corte tardo del cordn umbilical. ? Quin cuidar a su beb inmediatamente despus del nacimiento. ? Medicamentos o pruebas que pueden recomendarse para su beb. ? Si su hospital o centro de parto apoya la lactancia. ? Cunto tiempo estar en el hospital o en el centro de parto.  Cmo cualquier afeccin mdica que usted tenga puede afectar a su beb o la experiencia de trabajo de parto y  parto.  A fin de prepararse para el nacimiento de su beb, usted tambin debe:  Asistir a todas sus visitas de atencin mdica antes del parto (visitas prenatales) segn lo recomendado por su mdico. Esto es importante.  Preparacin del hogar para la llegada de su beb. Asegrese de tener lo siguiente: ? Paales. ? Ropa de beb. ? Equipo de alimentacin. ? Haga preparativos para que usted y su beb puedan dormir de manera segura.  Instale un asiento de beb en su vehculo. Haga verificar el asiento de beb de su coche por un instalador de asientos de beb para asegurarse de que est instalado en forma segura.  Piense en quin la ayudar con su nuevo beb en el hogar durante al menos las primeras semanas despus del parto.  Qu puedo esperar cuando llegue al centro de parto o el hospital? Una vez que se inicie el trabajo de parto y haya sido admitida en el hospital o centro de parto, el mdico podr hacer lo siguiente:  Revisar sus antecedentes de embarazo y cualquier inquietud que usted pueda tener.  Colocar un tubo (catter) intravenoso en una de sus venas. Esto se usa para administrarle lquidos y medicamentos.  Verificar su presin arterial, temperatura y pulso y la frecuencia cardaca (signos vitales).  Verificar si la bolsa de agua (saco amnitico) se ha roto (ruptura).  Hablar con usted sobre su plan de nacimiento y analizar las opciones para controlar el dolor.  Monitoreo Su mdico puede monitorear las contracciones (monitoreo uterino) y   el ritmo cardaco del beb (monitoreo fetal). Es posible que el monitoreo se necesite realizar:  Con frecuencia, pero no continuamente (intermitentemente).  Todo el tiempo o durante largos perodos a la vez (continuamente). El monitoreo continuo puede ser necesario si: ? Usted est recibiendo determinados medicamentos, tales como medicamentos para aliviar el dolor o para hacer que las contracciones ms fuertes. ? Tiene complicaciones  durante el embarazo o el trabajo de parto.  El monitoreo se puede realizar:  Al colocar un estetoscopio especial o un dispositivo manual de monitoreo en el abdomen o verificar los latidos cardacos de su beb y sentir su abdomen para controlar de contracciones. Este mtodo de monitoreo no registra los latidos cardacos de su beb ni sus contracciones de manera continua.  Colocar monitores en el abdomen (monitores externos) para registrar los latidos cardacos de su beb y la frecuencia y duracin de las contracciones. No tendr que tener colocados los monitores externos en todo momento.  Colocar monitores dentro del tero (monitores internos) para registrar los latidos cardacos de su beb y la frecuencia, duracin y fuerza de sus contracciones. ? Su mdico podr usar monitores internos si necesita ms informacin sobre la fuerza de las contracciones o la frecuencia cardaca del beb. ? Los monitores internos se colocan pasando un cable delgado y flexible a travs de la vagina hasta el tero. Segn el tipo de monitor, puede permanecer en el tero o en la cabeza del beb hasta el nacimiento. ? Su mdico analizar con usted los beneficios y los riesgos de usar un monitor interno y le pedir permiso antes de colocar los monitores.  Telemetra. Se trata de un tipo de monitoreo continuo que se puede realizar con monitores externos o internos. En lugar de tener que permanecer en la cama, usted puede moverse durante la telemetra. Pregunte a su mdico si la telemetra es una opcin para usted.  Examen fsico. Su mdico puede realizarle un examen fsico. Esto puede incluir lo siguiente:  Verificar en qu posicin se encuentra su beb: ? Con la cabeza hacia la vagina (cabeza abajo). Esta es la posicin ms frecuente. ? Con la cabeza hacia la parte superior del tero (cabeza arriba o de nalgas). Si su beb est en una posicin de nalgas, su mdico puede tratar de hacerlo girar para que quede cabeza abajo a  fin de poder tener un parto vaginal. Si parece que su beb no puede nacer con parto vaginal, su mdico puede recomendar una ciruga para dar a luz al beb. En casos muy poco frecuentes, usted puede dar a luz con parto vaginal si el beb est cabeza arriba (parto de nalgas). ? Posicin lateral (transversal). Los bebs que estn en posicin lateral no pueden nacer por parto vaginal.  Verificar el cuello uterino para determinar: ? Si se est afinando o estirando (borrando). ? Si se est abriendo (dilatando). ? Cunto se ha movido o ha bajado su beb por el canal del parto.  Cules son las tres etapas del trabajo de parto y el parto?  El trabajo de parto y el parto normales se dividen en tres etapas: Etapa 1  La etapa 1 es la etapa ms larga del trabajo de parto y puede durar horas o das. La etapa 1 incluye: ? Trabajo de parto temprano. Esto es cuando las contracciones pueden ser irregulares o regulares y leves. En general, las contracciones del trabajo de parto temprano se producen con ms de 10 minutos de diferencia. ? Trabajo de parto activo. Esto es cuando las   contracciones son ms largas, ms regulares, ms frecuentes y ms intensas. ? La fase de transicin. Esto es cuando las contracciones ocurren muy seguidas, son muy intensas y pueden durar ms que durante cualquier otra parte del trabajo de parto.  En general, las contracciones son leves, infrecuentes e irregulares al principio. Se hacen ms fuertes, ms frecuentes (aproximadamente cada 2 o 3 minutos) y ms regulares a medida que usted avanza desde un trabajo de parto temprano hasta un trabajo de parto activo y fase de transicin.  Muchas mujeres progresan a travs de la etapa 1 de forma natural, pero es posible que usted necesite ayuda para continuar avanzando. Si esto ocurre, su mdico puede hablar con usted sobre lo siguiente: ? La ruptura del saco amnitico si es que an no ha ocurrido. ? Administracin de medicamentos para ayudarla  a tener contracciones ms fuertes y ms frecuentes.  La etapa 1 finaliza cuando el cuello uterino est completamente dilatado hasta 4 pulgadas (10cm) y completamente borrado. Esto ocurre al final de la fase de transicin. Etapa 2  Una vez que el cuello uterino est totalmente borrado y dilatado a 4 pulgadas (10cm), usted puede comenzar a sentir ganas de pujar. Es comn que el cuerpo tome un descanso de manera natural antes de sentir ganas de pujar, especialmente si recibe una epidural u otros medicamentos para el dolor. Este perodo de descanso puede durar un mximo de 1 a 2 horas, segn su experiencia de parto nica.  Durante la etapa 2, las contracciones son generalmente menos doloras porque pujar ayuda a aliviar el dolor de las contracciones. En lugar del dolor de las contracciones, puede sentir un dolor urente y por estiramiento, especialmente cuando la parte ms ancha de la cabeza de su beb pasa a travs de la abertura vaginal (coronacin).  Su mdico controlar atentamente su avance con los pujos y el avance del beb a travs de la vagina durante la etapa 2.  Su mdico puede masajear el rea de la piel entre la abertura vaginal y el ano (perineo) o aplicar compresas tibias en el perineo. Esto ayuda al estiramiento ya que la cabeza del beb empieza a aparecer, lo cual puede ayudar a evitar un desgarro perineal. ? En algunos casos, se puede hacer una incisin en el peritoneo (episiotoma) para permitir que el beb pase a travs de la abertura vaginal. La episiotoma sirve para agrandar la abertura vaginal a fin de que el beb tenga ms espacio para pasar durante el parto.  Es muy importante respirar y concentrarse para que el mdico pueda controlar la salida de la cabeza del beb. Es posible que su mdico tenga que disminuir la intensidad de los pujos para ayudar a evitar un desgarro perineal.  Despus de que sale la cabeza del beb, en general salen los hombros y el resto del cuerpo muy  rpidamente y sin dificultad.  Una vez que el beb nace, se debe cortar el cordn umbilical de inmediato o esto puede demorar 1 o 2 minutos, segn la salud del beb. Este procedimiento puede variar segn el mdico, el hospital y el centro de parto.  Si usted y su beb estn lo suficientemente sanos, se le colocar el beb en el pecho o el abdomen para ayudar a mantener la temperatura del beb y el vnculo entre ustedes. Algunas madres y bebs comienzan la lactancia en este momento. Su equipo mdico secar al beb y lo ayudar a mantenerse caliente durante este tiempo.  Es posible que su beb necesite atencin   inmediata si: ? Mostr signos de sufrimiento durante el trabajo de parto. ? Tiene una afeccin mdica. ? Naci antes de tiempo (prematuro). ? Defeca antes del nacimiento (meconio). ? Muestra signos de dificultar en la transicin de estar dentro del tero a estar fuera del tero. Si tiene planeado amamantar, su equipo mdico la ayudar a comenzar la lactancia. Etapa 3  La tercera etapa del trabajo de parto comienza inmediatamente despus del nacimiento de su beb y finaliza despus de la expulsin de la placenta. La placenta es un rgano de que desarrolla durante el embarazo para proporcionar oxgeno y nutrientes al beb en el tero.  La expulsin de la placenta puede requerir algunos pujos y es posible que usted tenga contracciones leves. La lactancia puede estimular las contracciones para ayudar a expulsar la placenta.  Luego de la expulsin de la placenta, el tero debe (contraerse) y quedar muy firme. Esto ayuda a detener el sangrado en el tero. Para ayudar al tero a contraerse y controlar el sangrado, su mdico puede: ? Administrarle un medicamento inyectable, a travs de un tubo (catter) intravenoso, por boca o a travs del recto (por va rectal). ? Masajear el abdomen o realizar un examen de la vagina para extraer cualquier cogulo de sangre que quede en el tero. ? Vaciar la  vejiga colocando un tubo flexible (catter) en la vejiga. ? Alentarla a amamantar a su beb. Una vez que termina el trabajo de parto, se los controlar a usted y a su beb atentamente para tener la seguridad de que ambos estn sanos hasta que estn listos para ir a casa. Su equipo de atencin mdica le ensear cmo cuidarse y cuidar a su beb. Esta informacin no tiene como fin reemplazar el consejo del mdico. Asegrese de hacerle al mdico cualquier pregunta que tenga. Document Released: 02/23/2008 Document Revised: 06/21/2016 Document Reviewed: 03/27/2015 Elsevier Interactive Patient Education  2018 [employer] Inc.  

## 2017-04-18 NOTE — Progress Notes (Signed)
Patient ID: Karen ChromanSelene Casillas Parra, female   DOB: 05-06-1990, 27 y.o.   MRN: [id number]   PRENATAL VISIT NOTE  Subjective:  Karen Barajas is a 27 y.o. G1P0000 at 254w5d being seen today for ongoing prenatal care.  She is currently monitored for the following issues for this low-risk pregnancy and has Supervision of normal first pregnancy, antepartum; Maternal morbid obesity, antepartum (HCC); Significant discrepancy between uterine size and clinical dates, antepartum; and Edema during pregnancy in third trimester on their problem list.  Patient reports still swollen extremities. She is walking 30 minutes a day. Denies HA, blurry vision, decreased fetal movements, vaginal discharge, contractions or epigastric pain. Denies NV or dysuria. .  Contractions: Irregular. Vag. Bleeding: None.  Movement: Present. Denies leaking of fluid.   The following portions of the patient's history were reviewed and updated as appropriate: allergies, current medications, past family history, past medical history, past social history, past surgical history and problem list. Problem list updated.  Objective:   Vitals:   04/18/17 1453  BP: 135/81  Pulse: (!) 108  Weight: 275 lb 9.6 oz (125 kg)    Fetal Status: Fetal Heart Rate (bpm): 151 Fundal Height: 39 cm Movement: Present  Presentation: Vertex  General:  Alert, oriented and cooperative. Patient is in no acute distress.  Skin: Skin is warm and dry. No rash noted.   Cardiovascular: Normal heart rate noted  Respiratory: Normal respiratory effort, no problems with respiration noted  Abdomen: Soft, gravid, appropriate for gestational age.  Pain/Pressure: Present     Pelvic: Cervical exam performed        Extremities: Normal range of motion.  Edema: Trace  Mental Status:  Normal mood and affect. Normal behavior. Normal judgment and thought content.   Assessment and Plan:  Pregnancy: G1P0000 at 444w5d  1. Supervision of normal first pregnancy,  antepartum  - Culture, beta strep (group b only) - [facility]lam[facility]  2. Significant discrepancy between uterine size and clinical dates, antepartum -Follow-up growth US next week  3. Edema during pregnancy in third trimester Patient will continue to exercise 30 min a day; BP normal today and CBC and CMP and UPC normal from last week.   Term labor symptoms and general obstetric precautions including but not limited to vaginal bleeding, contractions, leaking of fluid and fetal movement were reviewed in detail with the patient. Please refer to After Visit Summary for other counseling recommendations.  Return in about 1 week (around 04/25/2017).   Marylene LandKathryn Lorraine Kooistra, CNM

## 2017-04-18 NOTE — Progress Notes (Signed)
Stratus interpreter Cephus SlaterNelly [phone]

## 2017-04-19 LAB — GC/CHLAMYDIA PROBE AMP (~~LOC~~) NOT AT ARMC
CHLAMYDIA, DNA PROBE: NEGATIVE
NEISSERIA GONORRHEA: NEGATIVE

## 2017-04-21 LAB — CULTURE, BETA STREP (GROUP B ONLY): Strep Gp B Culture: POSITIVE — AB

## 2017-04-22 ENCOUNTER — Ambulatory Visit (HOSPITAL_COMMUNITY): Payer: Self-pay

## 2017-04-22 ENCOUNTER — Other Ambulatory Visit: Payer: Self-pay | Admitting: Student

## 2017-04-22 ENCOUNTER — Ambulatory Visit (HOSPITAL_COMMUNITY)
Admission: RE | Admit: 2017-04-22 | Discharge: 2017-04-22 | Disposition: A | Discharge status: Home / Self Care | Payer: Self-pay | Source: Ambulatory Visit | Attending: Student | Admitting: Student

## 2017-04-22 DIAGNOSIS — Z3A37 37 weeks gestation of pregnancy: Secondary | ICD-10-CM | POA: Insufficient documentation

## 2017-04-22 DIAGNOSIS — Z362 Encounter for other antenatal screening follow-up: Secondary | ICD-10-CM | POA: Insufficient documentation

## 2017-04-22 DIAGNOSIS — O99213 Obesity complicating pregnancy, third trimester: Secondary | ICD-10-CM

## 2017-04-22 DIAGNOSIS — O26849 Uterine size-date discrepancy, unspecified trimester: Secondary | ICD-10-CM

## 2017-04-22 DIAGNOSIS — O26843 Uterine size-date discrepancy, third trimester: Secondary | ICD-10-CM | POA: Insufficient documentation

## 2017-04-25 ENCOUNTER — Ambulatory Visit (INDEPENDENT_AMBULATORY_CARE_PROVIDER_SITE_OTHER): Payer: Self-pay | Admitting: Student

## 2017-04-25 VITALS — BP 127/65 | HR 85 | Wt 275.6 lb

## 2017-04-25 DIAGNOSIS — B951 Streptococcus, group B, as the cause of diseases classified elsewhere: Secondary | ICD-10-CM

## 2017-04-25 DIAGNOSIS — Z3400 Encounter for supervision of normal first pregnancy, unspecified trimester: Secondary | ICD-10-CM

## 2017-04-25 DIAGNOSIS — O1203 Gestational edema, third trimester: Secondary | ICD-10-CM

## 2017-04-25 DIAGNOSIS — O26849 Uterine size-date discrepancy, unspecified trimester: Secondary | ICD-10-CM

## 2017-04-25 NOTE — Progress Notes (Signed)
   PRENATAL VISIT NOTE  Subjective:  Karen Barajas is a 27 y.o. G1P0000 at 6459w5d being seen today for ongoing prenatal care.  She is currently monitored for the following issues for this low-risk pregnancy and has Supervision of normal first pregnancy, antepartum; Maternal morbid obesity, antepartum (HCC); Significant discrepancy between uterine size and clinical dates, antepartum; and Edema during pregnancy in third trimester on their problem list.  Patient reports occasional contractions.  Contractions: Irregular. Vag. Bleeding: None.  Movement: Present. Denies leaking of fluid.   The following portions of the patient's history were reviewed and updated as appropriate: allergies, current medications, past family history, past medical history, past social history, past surgical history and problem list. Problem list updated.  Objective:   Vitals:   04/25/17 1603  BP: 127/65  Pulse: 85  Weight: 275 lb 9.6 oz (125 kg)    Fetal Status: Fetal Heart Rate (bpm): 147 Fundal Height: 40 cm Movement: Present     General:  Alert, oriented and cooperative. Patient is in no acute distress.  Skin: Skin is warm and dry. No rash noted.   Cardiovascular: Normal heart rate noted  Respiratory: Normal respiratory effort, no problems with respiration noted  Abdomen: Soft, gravid, appropriate for gestational age.  Pain/Pressure: Present     Pelvic: Cervical exam performed Dilation: Closed      Extremities: Normal range of motion.  Edema: Trace  Mental Status:  Normal mood and affect. Normal behavior. Normal judgment and thought content.   Assessment and Plan:  Pregnancy: G1P0000 at 5659w5d  1. Edema during pregnancy in third trimester Feels like her edema has gone down  2. Supervision of normal first pregnancy, antepartum Doing well; no complaints today.   3. Significant discrepancy between uterine size and clinical dates, antepartum MFM US shows FG in 84%; no follow up needed.   Term  labor symptoms and general obstetric precautions including but not limited to vaginal bleeding, contractions, leaking of fluid and fetal movement were reviewed in detail with the patient. Please refer to After Visit Summary for other counseling recommendations.  Return in about 1 week (around 05/02/2017), or LROB.   Marylene LandKathryn Lorraine Kooistra, CNM

## 2017-04-25 NOTE — Patient Instructions (Signed)
Parto vaginal Vaginal Delivery Parto vaginal significa que usted dar a luz empujando al beb fuera del canal del parto (vagina). Un equipo de proveedores de atencin mdica la ayudar antes, durante y despus del parto vaginal. Las experiencias de los nacimientos son nicas para todas las mujeres y cada embarazo y las experiencias de nacimiento varan segn dnde elija dar a luz. Qu debo hacer a fin de prepararme para el nacimiento de mi beb? Antes del nacimiento de su beb, es importante que hable con su mdico sobre lo siguiente:  Sus preferencias en cuanto al trabajo de parto y parto. Estas pueden incluir lo siguiente: ? Medicamentos que le puedan administrar. ? Cmo controlar el dolor. Esto podra incluir tcnicas de alivio del dolor no mdicas o medicamentos inyectables para aliviar el dolor como la analgesia epidural. ? Cmo se los controlar a usted y a su beb durante el trabajo de parto y el parto. ? Quin puede estar en la sala de trabajo de parto y parto con usted. ? Sus opiniones en cuanto al parto quirrgico de su beb (parto por cesrea o cesrea) si esto fuera necesario. ? Sus opiniones en cuanto a recibir sangre donada a travs de un tubo (catter) intravenoso (transfusin de sangre) si esto fuera necesario.  Si usted puede: ? Tomar fotografas o videos del nacimiento. ? Comer durante el trabajo de parto y el parto. ? Moverse, caminar o cambiar de posicin durante el trabajo de parto y el parto.  Qu esperar despus del nacimiento de su beb, como: ? Si se ofrece el pinzamiento y corte tardo del cordn umbilical. ? Quin cuidar a su beb inmediatamente despus del nacimiento. ? Medicamentos o pruebas que pueden recomendarse para su beb. ? Si su hospital o centro de parto apoya la lactancia. ? Cunto tiempo estar en el hospital o en el centro de parto.  Cmo cualquier afeccin mdica que usted tenga puede afectar a su beb o la experiencia de trabajo de parto y  parto.  A fin de prepararse para el nacimiento de su beb, usted tambin debe:  Asistir a todas sus visitas de atencin mdica antes del parto (visitas prenatales) segn lo recomendado por su mdico. Esto es importante.  Preparacin del hogar para la llegada de su beb. Asegrese de tener lo siguiente: ? Paales. ? Ropa de beb. ? Equipo de alimentacin. ? Haga preparativos para que usted y su beb puedan dormir de manera segura.  Instale un asiento de beb en su vehculo. Haga verificar el asiento de beb de su coche por un instalador de asientos de beb para asegurarse de que est instalado en forma segura.  Piense en quin la ayudar con su nuevo beb en el hogar durante al menos las primeras semanas despus del parto.  Qu puedo esperar cuando llegue al centro de parto o el hospital? Una vez que se inicie el trabajo de parto y haya sido admitida en el hospital o centro de parto, el mdico podr hacer lo siguiente:  Revisar sus antecedentes de embarazo y cualquier inquietud que usted pueda tener.  Colocar un tubo (catter) intravenoso en una de sus venas. Esto se usa para administrarle lquidos y medicamentos.  Verificar su presin arterial, temperatura y pulso y la frecuencia cardaca (signos vitales).  Verificar si la bolsa de agua (saco amnitico) se ha roto (ruptura).  Hablar con usted sobre su plan de nacimiento y analizar las opciones para controlar el dolor.  Monitoreo Su mdico puede monitorear las contracciones (monitoreo uterino) y   el ritmo cardaco del beb (monitoreo fetal). Es posible que el monitoreo se necesite realizar:  Con frecuencia, pero no continuamente (intermitentemente).  Todo el tiempo o durante largos perodos a la vez (continuamente). El monitoreo continuo puede ser necesario si: ? Usted est recibiendo determinados medicamentos, tales como medicamentos para aliviar el dolor o para hacer que las contracciones ms fuertes. ? Tiene complicaciones  durante el embarazo o el trabajo de parto.  El monitoreo se puede realizar:  Al colocar un estetoscopio especial o un dispositivo manual de monitoreo en el abdomen o verificar los latidos cardacos de su beb y sentir su abdomen para controlar de contracciones. Este mtodo de monitoreo no registra los latidos cardacos de su beb ni sus contracciones de manera continua.  Colocar monitores en el abdomen (monitores externos) para registrar los latidos cardacos de su beb y la frecuencia y duracin de las contracciones. No tendr que tener colocados los monitores externos en todo momento.  Colocar monitores dentro del tero (monitores internos) para registrar los latidos cardacos de su beb y la frecuencia, duracin y fuerza de sus contracciones. ? Su mdico podr usar monitores internos si necesita ms informacin sobre la fuerza de las contracciones o la frecuencia cardaca del beb. ? Los monitores internos se colocan pasando un cable delgado y flexible a travs de la vagina hasta el tero. Segn el tipo de monitor, puede permanecer en el tero o en la cabeza del beb hasta el nacimiento. ? Su mdico analizar con usted los beneficios y los riesgos de usar un monitor interno y le pedir permiso antes de colocar los monitores.  Telemetra. Se trata de un tipo de monitoreo continuo que se puede realizar con monitores externos o internos. En lugar de tener que permanecer en la cama, usted puede moverse durante la telemetra. Pregunte a su mdico si la telemetra es una opcin para usted.  Examen fsico. Su mdico puede realizarle un examen fsico. Esto puede incluir lo siguiente:  Verificar en qu posicin se encuentra su beb: ? Con la cabeza hacia la vagina (cabeza abajo). Esta es la posicin ms frecuente. ? Con la cabeza hacia la parte superior del tero (cabeza arriba o de nalgas). Si su beb est en una posicin de nalgas, su mdico puede tratar de hacerlo girar para que quede cabeza abajo a  fin de poder tener un parto vaginal. Si parece que su beb no puede nacer con parto vaginal, su mdico puede recomendar una ciruga para dar a luz al beb. En casos muy poco frecuentes, usted puede dar a luz con parto vaginal si el beb est cabeza arriba (parto de nalgas). ? Posicin lateral (transversal). Los bebs que estn en posicin lateral no pueden nacer por parto vaginal.  Verificar el cuello uterino para determinar: ? Si se est afinando o estirando (borrando). ? Si se est abriendo (dilatando). ? Cunto se ha movido o ha bajado su beb por el canal del parto.  Cules son las tres etapas del trabajo de parto y el parto?  El trabajo de parto y el parto normales se dividen en tres etapas: Etapa 1  La etapa 1 es la etapa ms larga del trabajo de parto y puede durar horas o das. La etapa 1 incluye: ? Trabajo de parto temprano. Esto es cuando las contracciones pueden ser irregulares o regulares y leves. En general, las contracciones del trabajo de parto temprano se producen con ms de 10 minutos de diferencia. ? Trabajo de parto activo. Esto es cuando las   contracciones son ms largas, ms regulares, ms frecuentes y ms intensas. ? La fase de transicin. Esto es cuando las contracciones ocurren muy seguidas, son muy intensas y pueden durar ms que durante cualquier otra parte del trabajo de parto.  En general, las contracciones son leves, infrecuentes e irregulares al principio. Se hacen ms fuertes, ms frecuentes (aproximadamente cada 2 o 3 minutos) y ms regulares a medida que usted avanza desde un trabajo de parto temprano hasta un trabajo de parto activo y fase de transicin.  Muchas mujeres progresan a travs de la etapa 1 de forma natural, pero es posible que usted necesite ayuda para continuar avanzando. Si esto ocurre, su mdico puede hablar con usted sobre lo siguiente: ? La ruptura del saco amnitico si es que an no ha ocurrido. ? Administracin de medicamentos para ayudarla  a tener contracciones ms fuertes y ms frecuentes.  La etapa 1 finaliza cuando el cuello uterino est completamente dilatado hasta 4 pulgadas (10cm) y completamente borrado. Esto ocurre al final de la fase de transicin. Etapa 2  Una vez que el cuello uterino est totalmente borrado y dilatado a 4 pulgadas (10cm), usted puede comenzar a sentir ganas de pujar. Es comn que el cuerpo tome un descanso de manera natural antes de sentir ganas de pujar, especialmente si recibe una epidural u otros medicamentos para el dolor. Este perodo de descanso puede durar un mximo de 1 a 2 horas, segn su experiencia de parto nica.  Durante la etapa 2, las contracciones son generalmente menos doloras porque pujar ayuda a aliviar el dolor de las contracciones. En lugar del dolor de las contracciones, puede sentir un dolor urente y por estiramiento, especialmente cuando la parte ms ancha de la cabeza de su beb pasa a travs de la abertura vaginal (coronacin).  Su mdico controlar atentamente su avance con los pujos y el avance del beb a travs de la vagina durante la etapa 2.  Su mdico puede masajear el rea de la piel entre la abertura vaginal y el ano (perineo) o aplicar compresas tibias en el perineo. Esto ayuda al estiramiento ya que la cabeza del beb empieza a aparecer, lo cual puede ayudar a evitar un desgarro perineal. ? En algunos casos, se puede hacer una incisin en el peritoneo (episiotoma) para permitir que el beb pase a travs de la abertura vaginal. La episiotoma sirve para agrandar la abertura vaginal a fin de que el beb tenga ms espacio para pasar durante el parto.  Es muy importante respirar y concentrarse para que el mdico pueda controlar la salida de la cabeza del beb. Es posible que su mdico tenga que disminuir la intensidad de los pujos para ayudar a evitar un desgarro perineal.  Despus de que sale la cabeza del beb, en general salen los hombros y el resto del cuerpo muy  rpidamente y sin dificultad.  Una vez que el beb nace, se debe cortar el cordn umbilical de inmediato o esto puede demorar 1 o 2 minutos, segn la salud del beb. Este procedimiento puede variar segn el mdico, el hospital y el centro de parto.  Si usted y su beb estn lo suficientemente sanos, se le colocar el beb en el pecho o el abdomen para ayudar a mantener la temperatura del beb y el vnculo entre ustedes. Algunas madres y bebs comienzan la lactancia en este momento. Su equipo mdico secar al beb y lo ayudar a mantenerse caliente durante este tiempo.  Es posible que su beb necesite atencin   inmediata si: ? Mostr signos de sufrimiento durante el trabajo de parto. ? Tiene una afeccin mdica. ? Naci antes de tiempo (prematuro). ? Defeca antes del nacimiento (meconio). ? Muestra signos de dificultar en la transicin de estar dentro del tero a estar fuera del tero. Si tiene planeado amamantar, su equipo mdico la ayudar a comenzar la lactancia. Etapa 3  La tercera etapa del trabajo de parto comienza inmediatamente despus del nacimiento de su beb y finaliza despus de la expulsin de la placenta. La placenta es un rgano de que desarrolla durante el embarazo para proporcionar oxgeno y nutrientes al beb en el tero.  La expulsin de la placenta puede requerir algunos pujos y es posible que usted tenga contracciones leves. La lactancia puede estimular las contracciones para ayudar a expulsar la placenta.  Luego de la expulsin de la placenta, el tero debe (contraerse) y quedar muy firme. Esto ayuda a detener el sangrado en el tero. Para ayudar al tero a contraerse y controlar el sangrado, su mdico puede: ? Administrarle un medicamento inyectable, a travs de un tubo (catter) intravenoso, por boca o a travs del recto (por va rectal). ? Masajear el abdomen o realizar un examen de la vagina para extraer cualquier cogulo de sangre que quede en el tero. ? Vaciar la  vejiga colocando un tubo flexible (catter) en la vejiga. ? Alentarla a amamantar a su beb. Una vez que termina el trabajo de parto, se los controlar a usted y a su beb atentamente para tener la seguridad de que ambos estn sanos hasta que estn listos para ir a casa. Su equipo de atencin mdica le ensear cmo cuidarse y cuidar a su beb. Esta informacin no tiene como fin reemplazar el consejo del mdico. Asegrese de hacerle al mdico cualquier pregunta que tenga. Document Released: 02/23/2008 Document Revised: 06/21/2016 Document Reviewed: 03/27/2015 Elsevier Interactive Patient Education  2018 [employer] Inc.  

## 2017-04-27 ENCOUNTER — Encounter (HOSPITAL_COMMUNITY): Payer: Self-pay | Admitting: *Deleted

## 2017-04-27 ENCOUNTER — Inpatient Hospital Stay (HOSPITAL_COMMUNITY)
Admission: AD | Admit: 2017-04-27 | Discharge: 2017-04-27 | Disposition: A | Discharge status: Home / Self Care | Payer: Self-pay | Source: Ambulatory Visit | Attending: Obstetrics and Gynecology | Admitting: Obstetrics and Gynecology

## 2017-04-27 DIAGNOSIS — O26893 Other specified pregnancy related conditions, third trimester: Secondary | ICD-10-CM | POA: Insufficient documentation

## 2017-04-27 DIAGNOSIS — R059 Cough, unspecified: Secondary | ICD-10-CM

## 2017-04-27 DIAGNOSIS — R509 Fever, unspecified: Secondary | ICD-10-CM | POA: Insufficient documentation

## 2017-04-27 DIAGNOSIS — O99213 Obesity complicating pregnancy, third trimester: Secondary | ICD-10-CM | POA: Insufficient documentation

## 2017-04-27 DIAGNOSIS — H65192 Other acute nonsuppurative otitis media, left ear: Secondary | ICD-10-CM | POA: Insufficient documentation

## 2017-04-27 DIAGNOSIS — O99210 Obesity complicating pregnancy, unspecified trimester: Secondary | ICD-10-CM

## 2017-04-27 DIAGNOSIS — R05 Cough: Secondary | ICD-10-CM | POA: Insufficient documentation

## 2017-04-27 DIAGNOSIS — Z3A38 38 weeks gestation of pregnancy: Secondary | ICD-10-CM | POA: Insufficient documentation

## 2017-04-27 DIAGNOSIS — R52 Pain, unspecified: Secondary | ICD-10-CM

## 2017-04-27 DIAGNOSIS — H65111 Acute and subacute allergic otitis media (mucoid) (sanguinous) (serous), right ear: Secondary | ICD-10-CM

## 2017-04-27 DIAGNOSIS — J09X2 Influenza due to identified novel influenza A virus with other respiratory manifestations: Secondary | ICD-10-CM

## 2017-04-27 LAB — CBC WITH DIFFERENTIAL/PLATELET
Basophils Absolute: 0 10*3/uL (ref 0.0–0.1)
Basophils Relative: 0 %
Eosinophils Absolute: 0 10*3/uL (ref 0.0–0.7)
Eosinophils Relative: 0 %
HCT: 37.2 % (ref 36.0–46.0)
Hemoglobin: 12.7 g/dL (ref 12.0–15.0)
Lymphocytes Relative: 10 %
Lymphs Abs: 0.6 10*3/uL — ABNORMAL LOW (ref 0.7–4.0)
MCH: 29.2 pg (ref 26.0–34.0)
MCHC: 34.1 g/dL (ref 30.0–36.0)
MCV: 85.5 fL (ref 78.0–100.0)
Monocytes Absolute: 0.3 10*3/uL (ref 0.1–1.0)
Monocytes Relative: 6 %
Neutro Abs: 4.5 10*3/uL (ref 1.7–7.7)
Neutrophils Relative %: 84 %
Platelets: 116 10*3/uL — ABNORMAL LOW (ref 150–400)
RBC: 4.35 MIL/uL (ref 3.87–5.11)
RDW: 14.2 % (ref 11.5–15.5)
WBC: 5.5 10*3/uL (ref 4.0–10.5)

## 2017-04-27 LAB — URINALYSIS, ROUTINE W REFLEX MICROSCOPIC
Bilirubin Urine: NEGATIVE
Glucose, UA: NEGATIVE mg/dL
Hgb urine dipstick: NEGATIVE
Ketones, ur: 5 mg/dL — AB
Leukocytes, UA: NEGATIVE
Nitrite: NEGATIVE
Protein, ur: NEGATIVE mg/dL
Specific Gravity, Urine: 1.015 (ref 1.005–1.030)
pH: 7 (ref 5.0–8.0)

## 2017-04-27 LAB — LACTIC ACID, PLASMA: Lactic Acid, Venous: 1.4 mmol/L (ref 0.5–1.9)

## 2017-04-27 LAB — INFLUENZA PANEL BY PCR (TYPE A & B)
Influenza A By PCR: POSITIVE — AB
Influenza B By PCR: NEGATIVE

## 2017-04-27 MED ORDER — BUTALBITAL-APAP-CAFFEINE 50-325-40 MG PO TABS
2.0000 | ORAL_TABLET | Freq: Once | ORAL | Status: AC
  Administered 2017-04-27: 2 via ORAL
  Filled 2017-04-27: qty 2

## 2017-04-27 MED ORDER — CEFAZOLIN SODIUM-DEXTROSE 2-4 GM/100ML-% IV SOLN
2.0000 g | Freq: Once | INTRAVENOUS | Status: AC
  Administered 2017-04-27: 2 g via INTRAVENOUS
  Filled 2017-04-27: qty 100

## 2017-04-27 MED ORDER — LACTATED RINGERS IV BOLUS (SEPSIS)
1000.0000 mL | Freq: Once | INTRAVENOUS | Status: AC
  Administered 2017-04-27: 1000 mL via INTRAVENOUS

## 2017-04-27 MED ORDER — AMOXICILLIN-POT CLAVULANATE 875-125 MG PO TABS: 1.0000 | ORAL_TABLET | Freq: Two times a day (BID) | ORAL | 0 refills | Status: DC

## 2017-04-27 MED ORDER — OSELTAMIVIR PHOSPHATE 75 MG PO CAPS
75.0000 mg | ORAL_CAPSULE | Freq: Once | ORAL | Status: AC
  Administered 2017-04-27: 75 mg via ORAL
  Filled 2017-04-27: qty 1

## 2017-04-27 MED ORDER — BENZONATATE 100 MG PO CAPS: 200.0000 mg | ORAL_CAPSULE | Freq: Three times a day (TID) | ORAL | 0 refills | Status: DC | PRN

## 2017-04-27 MED ORDER — ONDANSETRON 4 MG PO TBDP: 4.0000 mg | ORAL_TABLET | Freq: Three times a day (TID) | ORAL | 0 refills | Status: DC | PRN

## 2017-04-27 MED ORDER — BUTALBITAL-APAP-CAFFEINE 50-325-40 MG PO TABS: 1.0000 | ORAL_TABLET | Freq: Four times a day (QID) | ORAL | 0 refills | Status: DC | PRN

## 2017-04-27 MED ORDER — OSELTAMIVIR PHOSPHATE 75 MG PO CAPS: 75.0000 mg | ORAL_CAPSULE | Freq: Two times a day (BID) | ORAL | 0 refills | Status: DC

## 2017-04-27 NOTE — MAU Provider Note (Signed)
History   G1 @ 38.0 wks with fever, cough, ear ache on the right. Body aches, all started last night. States all family has been sick. Denies contractions, vag bleeding or ROM.  CSN: [id number]  Arrival date & time 04/27/17  1548   None     Chief Complaint  Patient presents with  . Back Pain  . Fever  . Generalized Body Aches  . Otalgia    HPI  Past Medical History:  Diagnosis Date  . UTI (lower urinary tract infection)     Past Surgical History:  Procedure Laterality Date  . NO PAST SURGERIES      Family History  Problem Relation Age of Onset  . Diabetes Maternal Grandmother   . Hyperlipidemia Maternal Grandfather   . Cancer Paternal Grandfather     Social History   Tobacco Use  . Smoking status: Never Smoker  . Smokeless tobacco: Never Used  Substance Use Topics  . Alcohol use: No  . Drug use: No    OB History    Gravida Para Term Preterm AB Living   1 0 0 0 0 0   SAB TAB Ectopic Multiple Live Births   0 0 0 0 0      Review of Systems  Constitutional: Positive for chills, fatigue and fever.  HENT: Positive for congestion and ear pain.   Eyes: Negative.   Respiratory: Positive for cough.   Cardiovascular: Negative.   Gastrointestinal: Negative.   Endocrine: Negative.   Genitourinary: Negative.   Musculoskeletal: Positive for back pain and myalgias.  Skin: Negative.   Allergic/Immunologic: Negative.   Neurological: Negative.   Hematological: Negative.   Psychiatric/Behavioral: Negative.     Allergies  Flexeril [cyclobenzaprine]  Home Medications    BP (!) 138/94 (BP Location: Right Arm)   Pulse (!) 131   Temp 98.8 F (37.1 C) (Oral)   Ht 5\' 1"  (1.549 m)   Wt 277 lb (125.6 kg)   LMP 06/25/2016 (Approximate)   SpO2 96%   BMI 52.34 kg/m   Physical Exam  Constitutional: She is oriented to person, place, and time. She appears well-developed and well-nourished.  HENT:  Head: Normocephalic.  Eyes: Pupils are equal, round, and reactive  to light.  Neck: Normal range of motion.  Cardiovascular: Normal rate, regular rhythm, normal heart sounds and intact distal pulses.  Pulmonary/Chest: Effort normal and breath sounds normal.  Abdominal: Soft. Bowel sounds are normal.  Musculoskeletal: Normal range of motion.  Neurological: She is alert and oriented to person, place, and time. She has normal reflexes.  Skin: Skin is warm and dry.  Psychiatric: She has a normal mood and affect. Her behavior is normal. Judgment and thought content normal.    MAU Course  Procedures (including critical care time)  Labs Reviewed  URINALYSIS, ROUTINE W REFLEX MICROSCOPIC  INFLUENZA PANEL BY PCR (TYPE A & B)  CBC WITH DIFFERENTIAL/PLATELET   No results found.   1. Acute mucoid otitis media of right ear   2. Maternal morbid obesity, antepartum (HCC)   3. Fever, unspecified   4. Cough   5. Generalized body aches       MDM  Rt eardrum red, bulging. Heart RRR, tachycardia noted, LCTAB. abd soft non tender and gravid. Flu swab pos for flu A. Will treat ear infection and tamiflu BID. fioricet for ear pain and fever. Will d/c home

## 2017-04-27 NOTE — MAU Note (Signed)
Pt reports cough, body aches, congestion, left ear pain, back pain, vomiting. Temp 100.1 this am.

## 2017-04-27 NOTE — Discharge Instructions (Signed)
Otitis media - Adultos (Otitis Media, Adult) La otitis media es la irritacin, dolor e inflamacin (hinchazn) en el espacio que se encuentra detrs del tmpano (odo medio). La causa puede ser Vella Raringuna alergia o una infeccin. Generalmente aparece junto con un resfro. CUIDADOS EN EL HOGAR  Tome los medicamentos segn las indicaciones. Finalice la prescripcin completa, aunque se sienta mejor.  Solo tome medicamentos de venta libre o recetados para Chief Technology Officerel dolor, Dentistmalestar o fiebre, como le indique el mdico.  [place] Riveroncurra a las consultas de control con el mdico, segn las indicaciones.  SOLICITE AYUDA SI:  Tiene otitis media slo en un odo o sangra por la nariz, o ambas cosas.  Advierte un bulto en el cuello.  No mejora luego de 3-5 das.  Empeora en lugar de mejorar.  SOLICITE AYUDA DE INMEDIATO SI:  Siente un dolor intenso y no lo Engelhard Corporationalivian los medicamentos.  Tiene irritacin, hinchazn o dolor en el odo.  Presenta rigidez en el cuello.  No puede mover una parte de su rostro (parlisis).  Nota que el hueso que se encuentra detrs de su oreja le duele al tocarlo.  ASEGRESE DE QUE:  Comprende estas instrucciones.  Controlar su afeccin.  Recibir ayuda de inmediato si no mejora o si empeora.  Esta informacin no tiene Theme park managercomo fin reemplazar el consejo del mdico. Asegrese de hacerle al mdico cualquier pregunta que tenga. Document Released: 04/14/2010 Document Revised: 04/02/2014 Document Reviewed: 10/07/2012 Elsevier Interactive Patient Education  2017 [employer] Gripe en los adultos (Influenza, Adult) La gripe es una infeccin en los pulmones, la nariz y la garganta (vas respiratorias). La causa un virus. La gripe provoca muchos sntomas del resfro comn, as como fiebre alta y Tourist information centre managerdolor corporal. Puede hacer que se sienta muy mal. Se transmite fcilmente de persona a persona (es contagiosa). La mejor manera de prevenir la gripe es aplicndose la vacuna contra la gripe todos  los aos. CUIDADOS EN EL HOGAR  Tome los medicamentos de venta libre y los recetados solamente como se lo haya indicado el mdico.  Use un humidificador de aire fro para que el aire de su casa est ms hmedo. Esto puede facilitar la respiracin.  Descanse todo lo que sea necesario.  Beba suficiente lquido para mantener el pis (orina) claro o de color amarillo plido.  Al toser o estornudar, cbrase la boca y la [place]nariz.  Lvese las manos con agua y jabn frecuentemente, en especial despus de toser o Engineering geologistestornudar. Use un desinfectante para manos si no dispone de [place]agua y Belarusjabn.  Lanny HurstQudese en su casa y no concurra al Aleen Campitrabajo o a la escuela, como se lo haya indicado el mdico. A menos que deba ir al American Expressmdico, evite salir de su casa hasta que no tenga fiebre durante 24horas sin el uso de medicamentos.  Concurra a todas las visitas de control como se lo haya indicado el mdico. Esto es importante.  PREVENCIN  Aplicarse la vacuna anual contra la gripe es la mejor manera de evitar contagiarse la gripe. Puede aplicarse la vacuna contra la gripe a fines de verano, en otoo o en invierno. Pregntele al mdico cundo debe aplicarse la vacuna contra la gripe.  Lvese las manos o use un desinfectante de manos con frecuencia.  Evite el contacto con personas que estn enfermas durante la temporada de resfro y gripe.  Consuma alimentos saludables.  Beba abundantes lquidos.  Duerma lo suficiente.  Haga ejercicios regularmente.  SOLICITE AYUDA SI:  Tiene sntomas nuevos.  Tiene los siguientes sntomas: ?  Dolor en el pecho. ? Deposiciones lquidas (diarrea). ? Fiebre.  La tos empeora.  Empieza a tener ms mucosidad.  Siente malestar estomacal (nuseas).  Vomita.  SOLICITE AYUDA DE INMEDIATO SI:  Siente que le falta el aire o tiene dificultad para Industrial/product designer.  La piel o las uas se tornan de un color [place].  Presenta un dolor intenso o rigidez en el cuello.  Siente dolor de  cabeza de forma repentina.  Le duele la cara o el odo de forma repentina.  No puede detener los vmitos.  Esta informacin no tiene Theme park manager el consejo del mdico. Asegrese de hacerle al mdico cualquier pregunta que tenga. Document Released: 06/08/2008 Document Revised: 07/04/2015 Document Reviewed: 01/04/2015 Elsevier Interactive Patient Education  2017 [employer].

## 2017-05-02 ENCOUNTER — Ambulatory Visit (INDEPENDENT_AMBULATORY_CARE_PROVIDER_SITE_OTHER): Payer: Self-pay | Admitting: Student

## 2017-05-02 VITALS — BP 116/74 | HR 83 | Wt 274.6 lb

## 2017-05-02 DIAGNOSIS — O26843 Uterine size-date discrepancy, third trimester: Secondary | ICD-10-CM

## 2017-05-02 DIAGNOSIS — Z3483 Encounter for supervision of other normal pregnancy, third trimester: Secondary | ICD-10-CM

## 2017-05-02 DIAGNOSIS — J101 Influenza due to other identified influenza virus with other respiratory manifestations: Secondary | ICD-10-CM

## 2017-05-02 DIAGNOSIS — O26849 Uterine size-date discrepancy, unspecified trimester: Secondary | ICD-10-CM

## 2017-05-02 NOTE — Patient Instructions (Signed)
Infeccin por estreptococo del grupoB durante el embarazo (Group B Streptococcus Infection During Pregnancy) El estreptococo del grupoB es un tipo de bacteria (EGB) (Streptococcus agalactiae) que suele encontrarse en personas saludables, comnmente en el recto, la vagina y los intestinos. En personas saludables y en mujeres no embarazadas, rara vez la bacteria provoca una enfermedad o complicaciones graves. Sin embargo, las mujeres [place] Saharacuya prueba de EGB es positiva durante el embarazo pueden transmitirle la bacteria al beb en el parto; esto puede provocar una infeccin grave en el beb despus del parto. Las mujeres con EGB tambin pueden tener infecciones durante el Psychiatristembarazo o inmediatamente despus del Carolinaparto, como infecciones de las vas urinarias (IVU) o infecciones del tero (infecciones uterinas). Tener EGB tambin H&R Blockaumenta el riesgo de complicaciones durante el Tuttleembarazo, como parto o trabajo de parto prematuros, aborto espontneo o muerte fetal. Se recomienda que todas las embarazadas se hagan pruebas (exmenes de deteccin) de rutina para determinar la presencia del estreptococo del grupoB. FACTORES DE RIESGO Puede tener un mayor riesgo de contraer una infeccin por estreptococo del grupoB durante el embarazo si ya le ocurri en un embarazo previo. SNTOMAS En la International Business Machinesmayora de los casos, la infeccin por EGB no causa sntomas en las Douglassvilleembarazadas. Los signos y sntomas de una posible infeccin relacionada con el EGB pueden incluir los siguientes:  Inicio del Comotrabajo de parto antes de la semana37 de gestacin.  Una infeccin de las vas urinarias o de la vejiga, que puede provocar: ? Grant RutsFiebre. ? Sensacin de dolor o ardor al ConocoPhillipsorinar. ? Ganas frecuentes de orinar.  Fiebre durante el Mars Hilltrabajo de Oak Hillsparto, junto con: ? Una secrecin con mal olor. ? Dolor a Public affairs consultantla palpacin en el tero. ? Frecuencia cardaca acelerada en la madre, en el beb o en ambos. Los sntomas poco frecuentes pero graves de una posible  infeccin relacionada con el EGB en las mujeres incluyen:  Infeccin en la sangre (septicemia). Esto puede provocar fiebre, escalofros o confusin.  Infeccin pulmonar (neumona). Esto puede provocar fiebre, escalofros, tos, respiracin rpida, dificultad para respirar o Journalist, newspaperdolor en el pecho.  Infeccin en los huesos, las articulaciones, la piel o los tejidos blandos. DIAGNSTICO Le pueden realizar exmenes para detectar el EGB entre la semana35 y la semana37 de gestacin. Si tiene sntomas de trabajo de Sport and exercise psychologistparto prematuro, Fish farm managerle pueden realizar los exmenes de deteccin antes. Esta afeccin se diagnostica a travs de los St. Maryresultados de Jacksonhavenanlisis de laboratorio de:  Un hisopo con lquido de la vagina y Administratorel recto.  Lauris PoagUna muestra de Comorosorina. TRATAMIENTO Esta enfermedad se trata con antibiticos. Al comenzar el trabajo de parto o apenas rompe bolsa (ruptura de las Red Devilmembranas), le administrarn antibiticos a travs de una va intravenosa (IV). Seguir con antibiticos hasta despus del parto. Si tiene un parto por cesrea, no necesita antibiticos salvo que las membranas ya se hayan roto. INSTRUCCIONES PARA EL CUIDADO EN EL HOGAR  Tome los medicamentos de venta libre y los recetados solamente como se lo haya indicado el mdico.  Tome los antibiticos como se lo haya indicado el mdico. No deje de tomar los antibiticos aunque comience a sentirse mejor.  Concurra a todas las visitas previas al parto (prenatales) y las visitas de control como se lo haya indicado el mdico. Esto es importante. SOLICITE ATENCIN MDICA SI:  Siente dolor o ardor al Geographical information systems officerorinar.  Tiene necesidad de orinar con frecuencia.  Tiene fiebre o siente escalofros.  Tiene una secrecin vaginal con mal olor. SOLICITE ATENCIN MDICA DE INMEDIATO SI:  Se produce  la ruptura de las [place].  Comienza el trabajo de [place].  Siente un dolor intenso en el abdomen.  Tiene dificultad para respirar.  Siente dolor en el pecho. Esta  informacin no tiene Theme park manager el consejo del mdico. Asegrese de hacerle al mdico cualquier pregunta que tenga. Document Released: 08/29/2007 Document Revised: 03/17/2013 Document Reviewed: 10/06/2015 Elsevier Interactive Patient Education  [employer].

## 2017-05-02 NOTE — Progress Notes (Signed)
   PRENATAL VISIT NOTE  Subjective:  Karen Barajas is a 27 y.o. G1P0000 at 351w5d being seen today for ongoing prenatal care.  She is currently monitored for the following issues for this low-risk pregnancy and has Supervision of normal first pregnancy, antepartum; Maternal morbid obesity, antepartum (HCC); Significant discrepancy between uterine size and clinical dates, antepartum; Edema during pregnancy in third trimester; Group beta Strep positive; and Influenza A on their problem list.  Patient reports s/p tamiflu. Feeling better now. Also had some thick mucousy discharge this morning. No odor, no itching, no pain.  Contractions: Irritability. Vag. Bleeding: None.  Movement: Present. Denies leaking of fluid.   The following portions of the patient's history were reviewed and updated as appropriate: allergies, current medications, past family history, past medical history, past social history, past surgical history and problem list. Problem list updated.  Objective:   Vitals:   05/02/17 1312  BP: 116/74  Pulse: 83  Weight: 274 lb 9.6 oz (124.6 kg)    Fetal Status: Fetal Heart Rate (bpm): 128 Fundal Height: 42 cm Movement: Present     General:  Alert, oriented and cooperative. Patient is in no acute distress.  Skin: Skin is warm and dry. No rash noted.   Cardiovascular: Normal heart rate noted  Respiratory: Normal respiratory effort, no problems with respiration noted  Abdomen: Soft, gravid, appropriate for gestational age.  Pain/Pressure: Absent     Pelvic: Cervical exam performed        Extremities: Normal range of motion.  Edema: Trace  Mental Status:  Normal mood and affect. Normal behavior. Normal judgment and thought content.   Assessment and Plan:  Pregnancy: G1P0000 at 4651w5d  1. Influenza A Feeling better after Tamiflu.   2. Encounter for supervision of other normal pregnancy in third trimester Normal discharge.   3. Significant discrepancy between uterine size  and clinical dates, antepartum Discussed with Dr. Vergie LivingPickens; will perform growth US at 40 weeks if not delivered.   Term labor symptoms and general obstetric precautions including but not limited to vaginal bleeding, contractions, leaking of fluid and fetal movement were reviewed in detail with the patient. Please refer to After Visit Summary for other counseling recommendations.  Return in about 1 week (around 05/09/2017), or LROB.   Karen Barajas, CNM

## 2017-05-10 ENCOUNTER — Ambulatory Visit (INDEPENDENT_AMBULATORY_CARE_PROVIDER_SITE_OTHER): Payer: Self-pay | Admitting: Student

## 2017-05-10 VITALS — BP 121/82 | HR 88 | Wt 279.0 lb

## 2017-05-10 DIAGNOSIS — Z3403 Encounter for supervision of normal first pregnancy, third trimester: Secondary | ICD-10-CM

## 2017-05-10 DIAGNOSIS — O26849 Uterine size-date discrepancy, unspecified trimester: Secondary | ICD-10-CM

## 2017-05-10 DIAGNOSIS — Z3400 Encounter for supervision of normal first pregnancy, unspecified trimester: Secondary | ICD-10-CM

## 2017-05-10 DIAGNOSIS — O26843 Uterine size-date discrepancy, third trimester: Secondary | ICD-10-CM

## 2017-05-10 DIAGNOSIS — J101 Influenza due to other identified influenza virus with other respiratory manifestations: Secondary | ICD-10-CM

## 2017-05-10 NOTE — Patient Instructions (Signed)
Induccin del trabajo de parto (Augmentation of Labor) La induccin del trabajo de parto hace referencia al uso de mtodos para estimular y fortalecer las contracciones uterinas durante el trabajo de parto. Esto se realiza cuando las contracciones son ms lentas o se detienen, demorando el progreso del trabajo de parto y el alumbramiento. Antes de comenzar con la induccin del trabajo de parto, el mdico evaluar la condicin de la madre y el beb, el tamao y la posicin del beb y el tamao del canal de parto. CULES SON LOS MOTIVOS DE LA INDUCCIN DEL TRABAJO DE PARTO? Los motivos de la induccin del trabajo de parto incluyen:  Trabajo de parto lento (prolongacin de la primera y segunda etapa del trabajo de parto), que se ha relacionado con un aumento de los riesgos maternos, como corioamnionitis, hemorragia posparto, parto vaginal instrumentado o desgarro perineal de tercer o cuarto grado.  Reduccin de la duracin promedio del trabajo de parto. QU MTODOS SE UTILIZAN PARA LA INDUCCIN DEL TRABAJO DE PARTO? Se pueden emplear varios mtodos para la induccin del trabajo de parto, que incluyen:  Oxitocina. Este medicamento estimula las contracciones. Se administrar a travs de un catter por va intravenosa (IV) que se inserta en una vena.  Rotura del saco lleno de lquido que rodea al feto (saco amnitico).  Ruptura de las membranas. El mdico separa el tejido del saco amnitico del cuello uterino, causando la liberacin de una hormona llamada progesterona, que puede estimular las contracciones uterinas.  Estimulacin de los pezones.  Estimulacin de ciertos puntos de presin en los tobillos.  Dilatacin manual o mecnica del cuello del tero. CULES SON LOS RIESGOS ASOCIADOS CON LA INDUCCIN DEL TRABAJO DE PARTO?  Sobreestimulacin de las contracciones uterinas (contracciones continuas, prolongadas, muy fuertes), causando distrs fetal.  Mayores posibilidades de infeccin en la  madre o el beb.  Desgarro uterino (ruptura).  Ruptura (abrupcin) de la placenta.  Mayores probabilidades de parto por cesrea, con frceps o ventosa.  CULES SON LOS MOTIVOS PARA NO REALIZAR UNA INDUCCIN DEL TRABAJO DE PARTO? La induccin del trabajo de parto no se debe realizar si:  El beb es demasiado grande para el canal de parto. Esto puede confirmarse con una ecografa.  El cordn umbilical cae por delante de la cabeza o las nalgas del beb (cordn prolapsado).  La madre tuvo una cesrea previa con una incisin vertical en el tero (o la clase de incisin utilizada se desconoce). No se debe usar una dosis alta de oxitocina si la madre tuvo una cesrea previa de cualquier clase.  La madre tuvo una ciruga previa en el tero.  La madre tiene herpes.  La madre tiene cncer de cuello del tero.  El beb est en posicin transversal.  La pelvis de la madre est deformada.  La madre est embarazada de ms de dos bebs. Esta informacin no tiene como fin reemplazar el consejo del mdico. Asegrese de hacerle al mdico cualquier pregunta que tenga. Document Released: 08/29/2007 Document Revised: 12/31/2012 Document Reviewed: 10/09/2012 Elsevier Interactive Patient Education  2017 Elsevier Inc.  

## 2017-05-10 NOTE — Progress Notes (Signed)
Pt stated had contraction 2x  this morning 10 minutes apart

## 2017-05-11 NOTE — Progress Notes (Signed)
Patient ID: Karen Barajas, female   DOB: 1990-04-15, 27 y.o.   MRN: [id number]    PRENATAL VISIT NOTE  Subjective:  Karen Barajas is a 27 y.o. G1P0000 at 887w0d being seen today for ongoing prenatal care.  She is currently monitored for the following issues for this high-risk pregnancy and has Supervision of normal first pregnancy, antepartum; Maternal morbid obesity, antepartum (HCC); Significant discrepancy between uterine size and clinical dates, antepartum; Edema during pregnancy in third trimester; Group beta Strep positive; and Influenza A on their problem list.  Patient reports occasional contractions.  Contractions: Irregular. Vag. Bleeding: None.  Movement: Present. Denies leaking of fluid.   The following portions of the patient's history were reviewed and updated as appropriate: allergies, current medications, past family history, past medical history, past social history, past surgical history and problem list. Problem list updated.  Objective:   Vitals:   05/10/17 1425  BP: 121/82  Pulse: 88  Weight: 279 lb (126.6 kg)    Fetal Status: Fetal Heart Rate (bpm): 158 Fundal Height: 41 cm Movement: Present  Presentation: Vertex  General:  Alert, oriented and cooperative. Patient is in no acute distress.  Skin: Skin is warm and dry. No rash noted.   Cardiovascular: Normal heart rate noted  Respiratory: Normal respiratory effort, no problems with respiration noted  Abdomen: Soft, gravid, appropriate for gestational age.  Pain/Pressure: Present     Pelvic: Cervical exam performed Dilation: 1      Extremities: Normal range of motion.  Edema: Trace  Mental Status:  Normal mood and affect. Normal behavior. Normal judgment and thought content.   Assessment and Plan:  Pregnancy: G1P0000 at 4887w0d  1. Significant discrepancy between uterine size and clinical dates, antepartum FH today measures 41; appropriate for dating.   2. Supervision of normal first pregnancy,  antepartum Reviewed s/s of labor and plan for NST/BPP next week.   3. Influenza A Feeling much better; s/p Tamiflu.   Term labor symptoms and general obstetric precautions including but not limited to vaginal bleeding, contractions, leaking of fluid and fetal movement were reviewed in detail with the patient. Please refer to After Visit Summary for other counseling recommendations.  Follow up on 2-18 for NST/BPP and appt with provider.    Marylene LandKathryn Lorraine Serai Tukes, CNM

## 2017-05-13 ENCOUNTER — Ambulatory Visit (INDEPENDENT_AMBULATORY_CARE_PROVIDER_SITE_OTHER): Payer: Self-pay | Admitting: *Deleted

## 2017-05-13 ENCOUNTER — Ambulatory Visit (INDEPENDENT_AMBULATORY_CARE_PROVIDER_SITE_OTHER): Payer: Self-pay | Admitting: Advanced Practice Midwife

## 2017-05-13 ENCOUNTER — Ambulatory Visit: Payer: Self-pay

## 2017-05-13 ENCOUNTER — Encounter: Payer: Self-pay | Admitting: Advanced Practice Midwife

## 2017-05-13 VITALS — BP 122/69 | HR 97 | Wt 280.5 lb

## 2017-05-13 DIAGNOSIS — O480 Post-term pregnancy: Secondary | ICD-10-CM

## 2017-05-13 DIAGNOSIS — Z3400 Encounter for supervision of normal first pregnancy, unspecified trimester: Secondary | ICD-10-CM

## 2017-05-13 NOTE — Progress Notes (Signed)
Interpreter Viviana SimplerAlis Herrera present for encounter. Pt reports occasional episodes of dizziness x2 days.

## 2017-05-13 NOTE — Progress Notes (Signed)
   PRENATAL VISIT NOTE  Subjective:  Karen Barajas is a 27 y.o. G1P0000 at 10738w2d being seen today for ongoing prenatal care.  She is currently monitored for the following issues for this low-risk pregnancy and has Supervision of normal first pregnancy, antepartum; Maternal morbid obesity, antepartum (HCC); Significant discrepancy between uterine size and clinical dates, antepartum; Edema during pregnancy in third trimester; Group beta Strep positive; and Influenza A on their problem list.  Patient reports no complaints.  Contractions: Irregular. Vag. Bleeding: Scant.  Movement: Present. Denies leaking of fluid.   The following portions of the patient's history were reviewed and updated as appropriate: allergies, current medications, past family history, past medical history, past social history, past surgical history and problem list. Problem list updated.  Objective:   Vitals:   05/13/17 1322  BP: 122/69  Pulse: 97  Weight: 280 lb 8 oz (127.2 kg)    Fetal Status: Fetal Heart Rate (bpm): NST   Movement: Present     General:  Alert, oriented and cooperative. Patient is in no acute distress.  Skin: Skin is warm and dry. No rash noted.   Cardiovascular: Normal heart rate noted  Respiratory: Normal respiratory effort, no problems with respiration noted  Abdomen: Soft, gravid, appropriate for gestational age.  Pain/Pressure: Present     Pelvic: Cervical exam performed        Extremities: Normal range of motion.     Mental Status:  Normal mood and affect. Normal behavior. Normal judgment and thought content.   Assessment and Plan:  Pregnancy: G1P0000 at 9438w2d  1. Post term pregnancy, antepartum condition or complication - NST/BPP today: BPP 8/10, 2 off for tone, AFI 6.5. NST: 145, moderate with 15x15 accels, no decels. Toco: no UCs Consult with Dr. Debroah LoopArnold, ok to proceed with IOL on 2/23 - IOL scheduled for: 05/18/17 @ 7:30am - Orders placed   2. Supervision of normal first  pregnancy, antepartum   Term labor symptoms and general obstetric precautions including but not limited to vaginal bleeding, contractions, leaking of fluid and fetal movement were reviewed in detail with the patient. Please refer to After Visit Summary for other counseling recommendations.  No Follow-up on file.   Thressa ShellerHeather Aasim Restivo, CNM

## 2017-05-13 NOTE — Progress Notes (Signed)

## 2017-05-14 ENCOUNTER — Telehealth (HOSPITAL_COMMUNITY): Payer: Self-pay | Admitting: *Deleted

## 2017-05-14 NOTE — Telephone Encounter (Signed)
[phone] interpreter number  Preadmission screen

## 2017-05-15 ENCOUNTER — Encounter (HOSPITAL_COMMUNITY): Payer: Self-pay

## 2017-05-15 ENCOUNTER — Inpatient Hospital Stay (HOSPITAL_COMMUNITY)
Admission: AD | Admit: 2017-05-15 | Discharge: 2017-05-18 | DRG: 807 | Disposition: A | Discharge status: Home / Self Care | Payer: Medicaid Other | Source: Ambulatory Visit | Attending: Obstetrics & Gynecology | Admitting: Obstetrics & Gynecology

## 2017-05-15 ENCOUNTER — Inpatient Hospital Stay (HOSPITAL_COMMUNITY)
Admission: AD | Admit: 2017-05-15 | Discharge: 2017-05-15 | Disposition: A | Discharge status: Home / Self Care | Payer: Medicaid Other | Source: Ambulatory Visit | Attending: Family Medicine | Admitting: Family Medicine

## 2017-05-15 ENCOUNTER — Inpatient Hospital Stay (HOSPITAL_COMMUNITY): Payer: Medicaid Other | Admitting: Anesthesiology

## 2017-05-15 ENCOUNTER — Other Ambulatory Visit: Payer: Self-pay

## 2017-05-15 DIAGNOSIS — N939 Abnormal uterine and vaginal bleeding, unspecified: Secondary | ICD-10-CM

## 2017-05-15 DIAGNOSIS — R109 Unspecified abdominal pain: Secondary | ICD-10-CM | POA: Insufficient documentation

## 2017-05-15 DIAGNOSIS — Z3A40 40 weeks gestation of pregnancy: Secondary | ICD-10-CM | POA: Insufficient documentation

## 2017-05-15 DIAGNOSIS — O99210 Obesity complicating pregnancy, unspecified trimester: Secondary | ICD-10-CM

## 2017-05-15 DIAGNOSIS — Z888 Allergy status to other drugs, medicaments and biological substances status: Secondary | ICD-10-CM

## 2017-05-15 DIAGNOSIS — O26893 Other specified pregnancy related conditions, third trimester: Secondary | ICD-10-CM | POA: Diagnosis present

## 2017-05-15 DIAGNOSIS — O99214 Obesity complicating childbirth: Secondary | ICD-10-CM | POA: Diagnosis present

## 2017-05-15 DIAGNOSIS — Z87440 Personal history of urinary (tract) infections: Secondary | ICD-10-CM | POA: Insufficient documentation

## 2017-05-15 DIAGNOSIS — O99213 Obesity complicating pregnancy, third trimester: Secondary | ICD-10-CM | POA: Diagnosis present

## 2017-05-15 DIAGNOSIS — O479 False labor, unspecified: Secondary | ICD-10-CM

## 2017-05-15 DIAGNOSIS — O4292 Full-term premature rupture of membranes, unspecified as to length of time between rupture and onset of labor: Secondary | ICD-10-CM | POA: Diagnosis present

## 2017-05-15 DIAGNOSIS — O99824 Streptococcus B carrier state complicating childbirth: Secondary | ICD-10-CM | POA: Diagnosis present

## 2017-05-15 DIAGNOSIS — O480 Post-term pregnancy: Secondary | ICD-10-CM | POA: Diagnosis present

## 2017-05-15 DIAGNOSIS — O4693 Antepartum hemorrhage, unspecified, third trimester: Secondary | ICD-10-CM

## 2017-05-15 LAB — CBC
HCT: 38.2 % (ref 36.0–46.0)
Hemoglobin: 12.9 g/dL (ref 12.0–15.0)
MCH: 29.3 pg (ref 26.0–34.0)
MCHC: 33.8 g/dL (ref 30.0–36.0)
MCV: 86.6 fL (ref 78.0–100.0)
PLATELETS: 123 10*3/uL — AB (ref 150–400)
RBC: 4.41 MIL/uL (ref 3.87–5.11)
RDW: 14.8 % (ref 11.5–15.5)
WBC: 9 10*3/uL (ref 4.0–10.5)

## 2017-05-15 LAB — POCT FERN TEST: POCT Fern Test: POSITIVE

## 2017-05-15 LAB — TYPE AND SCREEN
ABO/RH(D): A POS
Antibody Screen: NEGATIVE

## 2017-05-15 LAB — ABO/RH: ABO/RH(D): A POS

## 2017-05-15 MED ORDER — OXYCODONE-ACETAMINOPHEN 5-325 MG PO TABS: 2.0000 | ORAL_TABLET | ORAL | Status: DC | PRN

## 2017-05-15 MED ORDER — LACTATED RINGERS IV SOLN
INTRAVENOUS | Status: DC
  Administered 2017-05-15 – 2017-05-16 (×3): via INTRAVENOUS

## 2017-05-15 MED ORDER — PENICILLIN G POT IN DEXTROSE 60000 UNIT/ML IV SOLN
3.0000 10*6.[IU] | INTRAVENOUS | Status: DC
  Administered 2017-05-15 – 2017-05-16 (×4): 3 10*6.[IU] via INTRAVENOUS
  Filled 2017-05-15 (×6): qty 50

## 2017-05-15 MED ORDER — LIDOCAINE HCL (PF) 1 % IJ SOLN
INTRAMUSCULAR | Status: DC | PRN
  Administered 2017-05-15 (×2): 5 mL via EPIDURAL

## 2017-05-15 MED ORDER — PHENYLEPHRINE 40 MCG/ML (10ML) SYRINGE FOR IV PUSH (FOR BLOOD PRESSURE SUPPORT)
80.0000 ug | PREFILLED_SYRINGE | INTRAVENOUS | Status: DC | PRN
Start: 2017-05-15 — End: 2017-05-16
  Filled 2017-05-15: qty 10
  Filled 2017-05-15: qty 5

## 2017-05-15 MED ORDER — FLEET ENEMA 7-19 GM/118ML RE ENEM: 1.0000 | ENEMA | RECTAL | Status: DC | PRN

## 2017-05-15 MED ORDER — LIDOCAINE HCL (PF) 1 % IJ SOLN
30.0000 mL | INTRAMUSCULAR | Status: DC | PRN
  Filled 2017-05-15: qty 30

## 2017-05-15 MED ORDER — OXYTOCIN 40 UNITS IN LACTATED RINGERS INFUSION - SIMPLE MED
INTRAVENOUS | Status: AC
  Filled 2017-05-15: qty 1000

## 2017-05-15 MED ORDER — PHENYLEPHRINE 40 MCG/ML (10ML) SYRINGE FOR IV PUSH (FOR BLOOD PRESSURE SUPPORT)
80.0000 ug | PREFILLED_SYRINGE | INTRAVENOUS | Status: DC | PRN
  Administered 2017-05-15 (×2): 80 ug via INTRAVENOUS
  Filled 2017-05-15: qty 5

## 2017-05-15 MED ORDER — LACTATED RINGERS IV SOLN
500.0000 mL | INTRAVENOUS | Status: DC | PRN
  Administered 2017-05-15 (×2): 500 mL via INTRAVENOUS
  Administered 2017-05-15: 1000 mL via INTRAVENOUS

## 2017-05-15 MED ORDER — FENTANYL CITRATE (PF) 100 MCG/2ML IJ SOLN
100.0000 ug | INTRAMUSCULAR | Status: DC | PRN
  Filled 2017-05-15: qty 2

## 2017-05-15 MED ORDER — ACETAMINOPHEN 325 MG PO TABS
650.0000 mg | ORAL_TABLET | ORAL | Status: DC | PRN
  Administered 2017-05-15: 650 mg via ORAL
  Filled 2017-05-15: qty 2

## 2017-05-15 MED ORDER — ONDANSETRON HCL 4 MG/2ML IJ SOLN: 4.0000 mg | Freq: Four times a day (QID) | INTRAMUSCULAR | Status: DC | PRN

## 2017-05-15 MED ORDER — EPHEDRINE 5 MG/ML INJ
10.0000 mg | INTRAVENOUS | Status: DC | PRN
  Filled 2017-05-15: qty 2

## 2017-05-15 MED ORDER — FENTANYL 2.5 MCG/ML BUPIVACAINE 1/10 % EPIDURAL INFUSION (WH - ANES)
14.0000 mL/h | INTRAMUSCULAR | Status: DC | PRN
  Administered 2017-05-15 – 2017-05-16 (×3): 14 mL/h via EPIDURAL
  Filled 2017-05-15 (×3): qty 100

## 2017-05-15 MED ORDER — DIPHENHYDRAMINE HCL 50 MG/ML IJ SOLN: 12.5000 mg | INTRAMUSCULAR | Status: DC | PRN

## 2017-05-15 MED ORDER — OXYTOCIN 40 UNITS IN LACTATED RINGERS INFUSION - SIMPLE MED: 2.5000 [IU]/h | INTRAVENOUS | Status: DC

## 2017-05-15 MED ORDER — SODIUM CHLORIDE 0.9 % IV SOLN
5.0000 10*6.[IU] | Freq: Once | INTRAVENOUS | Status: AC
  Administered 2017-05-15: 5 10*6.[IU] via INTRAVENOUS
  Filled 2017-05-15: qty 5

## 2017-05-15 MED ORDER — OXYCODONE-ACETAMINOPHEN 5-325 MG PO TABS: 1.0000 | ORAL_TABLET | ORAL | Status: DC | PRN

## 2017-05-15 MED ORDER — OXYTOCIN 40 UNITS IN LACTATED RINGERS INFUSION - SIMPLE MED
1.0000 m[IU]/min | INTRAVENOUS | Status: DC
  Administered 2017-05-15: 2 m[IU]/min via INTRAVENOUS

## 2017-05-15 MED ORDER — TERBUTALINE SULFATE 1 MG/ML IJ SOLN
0.2500 mg | Freq: Once | INTRAMUSCULAR | Status: DC | PRN
  Filled 2017-05-15: qty 1

## 2017-05-15 MED ORDER — OXYTOCIN BOLUS FROM INFUSION
500.0000 mL | Freq: Once | INTRAVENOUS | Status: AC
  Administered 2017-05-16: 500 mL via INTRAVENOUS

## 2017-05-15 MED ORDER — SOD CITRATE-CITRIC ACID 500-334 MG/5ML PO SOLN: 30.0000 mL | ORAL | Status: DC | PRN

## 2017-05-15 MED ORDER — LACTATED RINGERS IV SOLN
500.0000 mL | Freq: Once | INTRAVENOUS | Status: AC
  Administered 2017-05-15: 500 mL via INTRAVENOUS

## 2017-05-15 NOTE — MAU Provider Note (Signed)
Chief Complaint:  Contractions  Seen by provider at 0230   HPI: Karen Barajas is a 27 y.o. G1P0000 at 41w4dwho presents to maternity admissions reporting contractions and low back pain.  Initially was an [employer] labor eval but RN uncomfortable with bleeding seen. . She reports good fetal movement, denies LOF, vaginal itching/burning, urinary symptoms, h/a, dizziness, n/v, diarrhea, constipation or fever/chills.    Abdominal Pain  This is a new problem. The current episode started today. The onset quality is gradual. The problem occurs intermittently. The problem has been waxing and waning. The pain is located in the LLQ and RLQ. The quality of the pain is cramping. The abdominal pain does not radiate. Pertinent negatives include no constipation, diarrhea, dysuria, fever, headaches, myalgias, nausea or vomiting. Nothing aggravates the pain. The pain is relieved by nothing. She has tried nothing for the symptoms.    RN Note: Pt states that she started having back pain 2 hours ago rating pain 5/10 constant. She also reports vaginal bleeding.  Reports good fetal movement.     Past Medical History: Past Medical History:  Diagnosis Date  . UTI (lower urinary tract infection)     Past obstetric history: OB History  Gravida Para Term Preterm AB Living  1 0 0 0 0 0  SAB TAB Ectopic Multiple Live Births  0 0 0 0 0    # Outcome Date GA Lbr Len/2nd Weight Sex Delivery Anes PTL Lv  1 Current               Past Surgical History: Past Surgical History:  Procedure Laterality Date  . NO PAST SURGERIES      Family History: Family History  Problem Relation Age of Onset  . Diabetes Maternal Grandmother   . Hyperlipidemia Maternal Grandfather   . Cancer Paternal Grandfather     Social History: Social History   Tobacco Use  . Smoking status: Never Smoker  . Smokeless tobacco: Never Used  Substance Use Topics  . Alcohol use: No  . Drug use: No    Allergies:  Allergies   Allergen Reactions  . Flexeril [Cyclobenzaprine] Itching    Itchy dry throat, chest tightness    Meds:  Medications Prior to Admission  Medication Sig Dispense Refill Last Dose  . acetaminophen (TYLENOL) 500 MG tablet Take 500 mg by mouth as needed.   Not Taking  . Prenatal Vit-Fe Fumarate-FA (MULTIVITAMIN-PRENATAL) 27-0.8 MG TABS tablet Take 1 tablet by mouth daily at 12 noon.   Taking    I have reviewed patient's Past Medical Hx, Surgical Hx, Family Hx, Social Hx, medications and allergies.   ROS:  Review of Systems  Constitutional: Negative for fever.  Gastrointestinal: Positive for abdominal pain. Negative for constipation, diarrhea, nausea and vomiting.  Genitourinary: Negative for dysuria.  Musculoskeletal: Negative for myalgias.  Neurological: Negative for headaches.   Other systems negative  Physical Exam   Patient Vitals for the past 24 hrs:  BP Temp Temp src Pulse SpO2 Height Weight  05/15/17 0138 116/85 98.1 F (36.7 C) Oral 85 100 % 5\' 4"  (1.626 m) -  05/15/17 0124 - - - - - 5\' 2"  (1.575 m) 284 lb (128.8 kg)   Constitutional: Well-developed, well-nourished female in no acute distress.  Cardiovascular: normal rate and rhythm Respiratory: normal effort, clear to auscultation bilaterally GI: Abd soft, non-tender, gravid appropriate for gestational age.   No rebound or guarding. MS: Extremities nontender, no edema, normal ROM Neurologic: Alert and oriented x 4.  GU: Neg CVAT.  PELVIC EXAM: Moderate blood, c/w bloody show.  No active hemorrhage Dilation: 1.5 Effacement (%): 80, 90 Station: -2 Presentation: Vertex Exam by:: Zenia ResidesNikki Risheq, RN [phone]  This is a change from first exam which had a longer cervix.  + effacement  FHT:  Baseline 140 , moderate variability, accelerations present, no decelerations Contractions: Irregular     Labs: No results found for this or any previous visit (from the past 24 hour(s)). A/Positive/-- (08/08 [id number])   Imaging:     MAU Course/MDM: NST reviewed, reactive, not in labor Consult Dr Adrian BlackwaterStinson with presentation, exam findings and test results.  Treatments in MAU included EFM Discussed bleeding.  Dr Adrian BlackwaterStinson agrees bleeding is likely show from new effacement.    Assessment: 1. Maternal morbid obesity, antepartum (HCC)   2.  SIUP at 4622w4d 3.    Irregular contractions, not in labor 4.   Bleeding, likely show  Plan: Discharge home Labor precautions and fetal kick counts Follow up in Office for prenatal visits and recheck  Encouraged to return here or to other Urgent Care/ED if she develops worsening of symptoms, increase in pain, fever, or other concerning symptoms.   Pt stable at time of discharge.  Wynelle BourgeoisMarie Shia Delaine CNM, MSN Certified Nurse-Midwife 05/15/2017 3:13 AM

## 2017-05-15 NOTE — Progress Notes (Signed)
Patient ID: Karen Barajas, female   DOB: [birth year], 27 y.o.   MRN: [id number]  Feeling vag pressure/pain. Adequate MVUs since 2030; cx 6/90/-3...a small amt of caput beginning to descend into pelvis. FHR 150s, early variables with ctx; Pit decreased to 272mu/min due to high MVUs and variables. Dr Despina HiddenEure updated- provided stable FHR, will give until 0230 to make cx change. PCA epidural for discomfort. Guarded for vag del.  [name], Karen Barajas CNM 05/15/2017 11:05 PM

## 2017-05-15 NOTE — Anesthesia Pain Management Evaluation Note (Signed)
  CRNA Pain Management Visit Note  Patient: Karen Barajas, 27 y.o., female  "Hello I am a member of the anesthesia team at [facility]. We have an anesthesia team available at all times to provide care throughout the hospital, including epidural management and anesthesia for C-section. I don't know your plan for the delivery whether it a natural birth, water birth, IV sedation, nitrous supplementation, doula or epidural, but we want to meet your pain goals."   1.Was your pain managed to your expectations on prior hospitalizations?   No prior hospitalizations  2.What is your expectation for pain management during this hospitalization?     Epidural  3.How can we help you reach that goal? epidural  Record the patient's initial score and the patient's pain goal.   Pain: 0  Pain Goal: 4 [facility] wants you to be able to say your pain was always managed very well.  [name] 05/15/2017

## 2017-05-15 NOTE — H&P (Signed)
LABOR AND DELIVERY ADMISSION HISTORY AND PHYSICAL NOTE  Karen Barajas is a 27 y.o. female G1P0000 with IUP at 5422w4d by 12 week US presenting for SROM at unsure time.  She reports positive fetal movement. She denies vaginal bleeding.  Prenatal History/Complications[facility] Pregnancy complications:  - thickened nuchal fold with hypoplastic nasal bone (low risk Panorama) -Influenza A positive on 2/2- Tamiflu completed last week per patient    Past Medical History: Past Medical History:  Diagnosis Date  . UTI (lower urinary tract infection)     Past Surgical History: Past Surgical History:  Procedure Laterality Date  . NO PAST SURGERIES      Obstetrical History: OB History    Gravida Para Term Preterm AB Living   1 0 0 0 0 0   SAB TAB Ectopic Multiple Live Births   0 0 0 0 0      Social History: Social History   Socioeconomic History  . Marital status: Single    Spouse name: None  . Number of children: None  . Years of education: None  . Highest education level: None  Social Needs  . Financial resource strain: None  . Food insecurity - worry: None  . Food insecurity - inability: None  . Transportation needs - medical: None  . Transportation needs - non-medical: None  Occupational History  . None  Tobacco Use  . Smoking status: Never Smoker  . Smokeless tobacco: Never Used  Substance and Sexual Activity  . Alcohol use: No  . Drug use: No  . Sexual activity: Yes    Comment: last sex 4 months ago  Other Topics Concern  . None  Social History Narrative  . None    Family History: Family History  Problem Relation Age of Onset  . Diabetes Maternal Grandmother   . Hyperlipidemia Maternal Grandfather   . Cancer Paternal Grandfather     Allergies: Allergies  Allergen Reactions  . Flexeril [Cyclobenzaprine] Itching    Itchy dry throat, chest tightness    Medications Prior to Admission  Medication Sig Dispense Refill Last Dose  .  acetaminophen (TYLENOL) 500 MG tablet Take 500 mg by mouth as needed.   Not Taking  . Prenatal Vit-Fe Fumarate-FA (MULTIVITAMIN-PRENATAL) 27-0.8 MG TABS tablet Take 1 tablet by mouth daily at 12 noon.   Taking     Review of Systems  All systems reviewed and negative except as stated in HPI  Physical Exam Blood pressure 139/79, pulse 81, temperature 97.7 F (36.5 C), temperature source Oral, resp. rate 18, last menstrual period 06/25/2016. General appearance: alert, cooperative and no distress Lungs: clear to auscultation bilaterally Heart: regular rate and rhythm Abdomen: soft, non-tender; bowel sounds normal Extremities: No calf swelling or tenderness Presentation: cephalic by cervical exam  Fetal monitoring: 130/ moderate/ +accel/ no decel  Uterine activity: 5-6 minutes  Dilation: 2.5 Effacement (%): 90 Station: -2 Exam by:: Ginnie Smartachel Schmidt RN  Prenatal labs: ABO, Rh: A/Positive/-- (08/08 1602) Antibody: Negative (08/08 1602) Rubella: 1.33 (08/08 1602) RPR: Non Reactive (11/29 0838)  HBsAg: Negative (08/08 1602)  HIV: Non Reactive (11/29 [id number])  GC/Chlamydia: Negative (1/24) GBS:   Positive (04/18/17) 1 hr Glucola: Normal Genetic screening:  negative Anatomy US: enlarged nuchal fold and hypoplastic nasal bone, Female  Clinic [facility] Prenatal Labs  Dating  12 wk US Blood type: A/Positive/-- (08/08 1602) A POS  Genetic Screen  FIRST- NT wnl , screen Neg    AFP: negative  Panorama - 9/27 -  low risk Antibody:Negative (08/08 1602)NEG  Anatomic [place]  Enlarged Nuchal Fold, Hypoplastic nasal bone  Rubella: 1.33 (08/08 1602)IMMUNE  GTT Early:   N/A           Third trimester: Normal 2 hr RPR: Non Reactive (11/29 0838) NR  Flu vaccine  01/23/17 HBsAg: Negative (08/08 1602) NEG  Tdap vaccine 02/21/17 HIV: Non Reactive (11/29 0838) NR  Baby Food     breast                                          GBS:  POSITIVE (For PCN allergy, check sensitivities) neg  Contraception Patch after weaning  Pap: Negative - absent TZ  Circumcision  unsure   Pediatrician  Dr. Kathlene November - [place]   Support Person Mother (Raquel Jamelle Haring)   Prenatal Classes No Hgb electrophoresis: Normal   Prenatal Transfer Tool  Maternal Diabetes: No Genetic Screening: Normal Maternal Ultrasounds/Referrals: Abnormal:  Findings:   Other: Fetal Ultrasounds or other Referrals:  None Maternal Substance Abuse:  No Significant Maternal Medications:  None Significant Maternal Lab Results: Lab values include: Group B Strep positive  Results for orders placed or performed during the hospital encounter of 05/15/17 (from the past 24 hour(s))  Fern Test   Collection Time: 05/15/17 10:11 AM  Result Value Ref Range   POCT Fern Test Positive = ruptured amniotic membanes     Patient Active Problem List   Diagnosis Date Noted  . Indication for care in labor or delivery 05/15/2017  . Influenza A 05/02/2017  . Group beta Strep positive 04/25/2017  . Edema during pregnancy in third trimester 04/11/2017  . Significant discrepancy between uterine size and clinical dates, antepartum 03/21/2017  . Maternal morbid obesity, antepartum (HCC) 11/08/2016  . Supervision of normal first pregnancy, antepartum 10/31/2016    Assessment: Karen Barajas Cord is a 27 y.o. G1P0000 at [redacted]w[redacted]d here for SROM at unsure time   #Labor: Pitocin augmentation due to unsure rupture time and contractions 6 minutes/ mild by palpation  #Pain: Plans epidural. Medication ordered for PRN use #FWB: Cat 1 #ID:  GBS pos #MOF: Breast and bottle #MOC:Patch  #Circ:  No   Sharyon Cable, CNM, 05/15/2017, 10:45 AM

## 2017-05-15 NOTE — Anesthesia Procedure Notes (Signed)
Epidural Patient location during procedure: OB Start time: 05/15/2017 12:34 PM  Staffing Anesthesiologist: Leonides GrillsEllender, Alashia Brownfield P, MD Performed: anesthesiologist   Preanesthetic Checklist Completed: patient identified, site marked, pre-op evaluation, timeout performed, IV checked, risks and benefits discussed and monitors and equipment checked  Epidural Patient position: sitting Prep: DuraPrep Patient monitoring: heart rate, cardiac monitor, continuous pulse ox and blood pressure Approach: midline Location: L3-L4 Injection technique: LOR air  Needle:  Needle type: Tuohy  Needle gauge: 17 G Needle length: 9 cm Needle insertion depth: 10 cm Catheter type: closed end flexible Catheter size: 19 Gauge Catheter at skin depth: 15 cm Test dose: negative and Other  Assessment Events: blood not aspirated, injection not painful, no injection resistance and negative IV test  Additional Notes Informed consent obtained prior to proceeding including risk of failure, 1% risk of PDPH, risk of minor discomfort and bruising. Discussed alternatives to epidural analgesia and patient desires to proceed.  Timeout performed pre-procedure verifying patient name, procedure, and platelet count.  Patient tolerated procedure well. Reason for block:procedure for pain

## 2017-05-15 NOTE — MAU Note (Signed)
Pt states that she started having back pain 2 hours ago rating pain 5/10 constant. She also reports vaginal bleeding.  Reports good fetal movement.

## 2017-05-15 NOTE — Anesthesia Preprocedure Evaluation (Signed)
Anesthesia Evaluation  Patient identified by MRN, date of birth, ID band Patient awake    Reviewed: Allergy & Precautions, H&P , NPO status , Patient's Chart, lab work & pertinent test results  History of Anesthesia Complications Negative for: history of anesthetic complications  Airway Mallampati: II  TM Distance: >3 FB Neck ROM: full    Dental no notable dental hx. (+) Teeth Intact   Pulmonary neg pulmonary ROS,    Pulmonary exam normal breath sounds clear to auscultation       Cardiovascular negative cardio ROS Normal cardiovascular exam Rhythm:regular Rate:Normal     Neuro/Psych negative neurological ROS  negative psych ROS   GI/Hepatic negative GI ROS, Neg liver ROS,   Endo/Other  Morbid obesity  Renal/GU negative Renal ROS  negative genitourinary   Musculoskeletal   Abdominal (+) + obese,   Peds  Hematology negative hematology ROS (+)   Anesthesia Other Findings Super obese  Reproductive/Obstetrics (+) Pregnancy                             Anesthesia Physical Anesthesia Plan  ASA: IV  Anesthesia Plan: Epidural   Post-op Pain Management:    Induction:   PONV Risk Score and Plan:   Airway Management Planned:   Additional Equipment:   Intra-op Plan:   Post-operative Plan:   Informed Consent: I have reviewed the patients History and Physical, chart, labs and discussed the procedure including the risks, benefits and alternatives for the proposed anesthesia with the patient or authorized representative who has indicated his/her understanding and acceptance.     Plan Discussed with:   Anesthesia Plan Comments:         Anesthesia Quick Evaluation

## 2017-05-15 NOTE — Progress Notes (Signed)
Patient ID: Karen Barajas, female   DOB: October 15, 1990, 27 y.o.   MRN: [id number]  Mostly comfortable w/ epidural; PCN x 4 doses  BP 133/83, other VSS FHR 150s, +STV, mild early variables w/ ctx Ctx q 2-4 mins, Pit @ 4 mu/min; MVUs adequate since 2030 Cx per RN 6/-/-3  IUP@term  Active labor ROM since this AM GBS pos  Continue to keep ctx adequate; reexamine cx in 1-2 hrs for progress  [name], [name] CNM 05/15/2017

## 2017-05-15 NOTE — Discharge Instructions (Signed)
Evaluacin de los movimientos fetales Fetal Movement Counts Introduccin Nombre del paciente: ________________________________________________ Karen Barajas estimada: ____________________ Young Berry evaluacin de los movimientos fetales? Una evaluacin de los movimientos fetales es el registro del nmero de veces que siente que el beb se mueve durante un cierto perodo de [place]. Esto tambin se puede denominar recuento de patadas fetales. Una evaluacin de movimientos fetales se recomienda a todas las embarazadas. Es posible que le indiquen que comience a Development worker, community los movimientos fetales desde la semana 28 de [place]. Preste atencin cuando sienta que el beb est ms activo. Podr detectar los ciclos en que el beb duerme y est despierto. Tambin podr detectar que ciertas cosas hacen que su beb se mueva ms. Deber realizar una evaluacin de los movimientos fetales en las siguientes situaciones:  Cuando el beb est ms activo habitualmente.  A la [employer], todos los [place].  Un buen momento para evaluar los movimientos fetales es cuando est descansando, despus de haber comido y bebido algo. Cmo debo contar los movimientos fetales? 1. Encuentre un lugar tranquilo y cmodo. Sintese o acustese de lado. 2. Anote la fecha, la hora de inicio y de finalizacin y la cantidad de movimientos que sinti entre esas dos horas. Lleve esta informacin a las visitas de control. 3. Cuente las pataditas, revoloteos, chasquidos, vueltas o pinchazos en un perodo de 2horas. Debe sentir al menos en 2horas. 4. Cuando sienta , puede dejar de contar. 5. Si no siente en 2horas, coma y beba algo. Luego, contine descansando y Industrial/product designer. Si siente al menos durante esa hora, puede dejar de contar. Comunquese con un mdico si:  Siente menos de en 2horas.  El beb no se mueve tanto como suele hacerlo. Fecha:  ____________ Stevan Born inicio: ____________ Stevan Born finalizacin: ____________ Movimientos: ____________ Franco Nones: ____________ Stevan Born inicio: ____________ Stevan Born finalizacin: ____________ Movimientos: ____________ Franco Nones: ____________ Stevan Born inicio: ____________ Stevan Born finalizacin: ____________ Movimientos: ____________ Franco Nones: ____________ Stevan Born inicio: ____________ Stevan Born finalizacin: ____________ Movimientos: ____________ Franco Nones: ____________ Stevan Born inicio: ____________ Mammie Russian de finalizacin: ____________ Movimientos: ____________ Franco Nones: ____________ Stevan Born inicio: ____________ Mammie Russian de finalizacin: ____________ Movimientos: ____________ Franco Nones: ____________ Stevan Born inicio: ____________ Mammie Russian de finalizacin: ____________ Movimientos: ____________ Franco Nones: ____________ Stevan Born inicio: ____________ Stevan Born finalizacin: ____________ Movimientos: ____________ Franco Nones: ____________ Stevan Born inicio: ____________ Mammie Russian de finalizacin: ____________ Movimientos: ____________ Esta informacin no tiene como fin reemplazar el consejo del mdico. Asegrese de hacerle al mdico cualquier pregunta que tenga. Document Released: 06/19/2007 Document Revised: 06/15/2016 Document Reviewed: 04/21/2015 Elsevier Interactive Patient Education  2018 [employer]. Parto vaginal Vaginal Delivery Parto vaginal significa que usted dar a luz empujando al beb fuera del canal del parto (vagina). Un equipo de proveedores de atencin mdica la ayudar antes, durante y despus del parto vaginal. Las experiencias de los nacimientos son nicas para todas las mujeres y Sports administrator y las experiencias de nacimiento varan segn dnde elija dar a luz. Qu debo hacer a fin de prepararme para el nacimiento de mi beb? Antes del nacimiento de su beb, es importante que hable con su mdico sobre lo siguiente:  Sus preferencias en cuanto al Aleen Campi de parto y [place]. Estas pueden incluir lo siguiente: ? Administrator. ? Cmo Human resources officer. Esto podra incluir tcnicas de alivio del dolor no mdicas o medicamentos inyectables para Engineer, materials como la analgesia epidural. ? Cmo se los controlar a usted y a su beb  durante el trabajo de parto y [place]. ? Quin puede estar en la sala de [place] de parto y parto con usted. ? Sus opiniones en cuanto al parto quirrgico de su beb (parto por cesrea [place]) si esto fuera necesario. ? Sus opiniones en cuanto a recibir sangre donada a travs de un tubo (catter) intravenoso (transfusin de [place]) si esto fuera necesario.  Si usted puede: ? Tomar fotografas o videos del nacimiento. ? Comer durante el [place] de parto y [place]. ? Moverse, caminar o Multimedia programmer de posicin durante el [place] de parto y [place].  Qu esperar despus del nacimiento de su beb, como: ? Si se ofrece el pinzamiento y corte tardo del cordn umbilical. ? Quin cuidar a su beb inmediatamente despus del nacimiento. ? Medicamentos o pruebas que pueden recomendarse para su beb. ? Si su hospital o centro de parto apoya la lactancia. ? Cunto Furniture conservator/restorer hospital o en el centro de [place].  Cmo cualquier afeccin mdica que usted tenga puede afectar a su beb o la experiencia de Aleen Campi de parto y [place].  A fin de prepararse para el nacimiento de su beb, usted tambin debe:  Asistir a todas sus visitas de atencin mdica antes del parto (visitas prenatales) segn lo recomendado por su mdico. Esto es importante.  Preparacin del hogar para la llegada de su beb. Asegrese de tener lo siguiente: ? Paales. ? Ropa de beb. ? Equipo de alimentacin. ? Haga preparativos para que usted y su beb puedan dormir de IT consultant.  Instale un asiento de beb en su vehculo. Haga verificar el asiento de beb de su coche por un instalador de asientos de beb para asegurarse de que est instalado en forma segura.  Piense en quin la ayudar con su nuevo  beb en el hogar durante al [employer] las primeras semanas despus del [place].  Qu puedo esperar cuando llegue al centro de parto o el hospital? Pollyann Savoy que se inicie el [place] de parto y haya sido admitida en el hospital o centro de parto, el mdico podr hacer lo siguiente:  Revisar sus antecedentes de Psychiatrist y cualquier inquietud que usted pueda tener.  Colocar un tubo (catter) intravenoso en una de sus venas. Esto se [place] para administrarle lquidos y medicamentos.  Verificar su presin arterial, temperatura y pulso y la frecuencia cardaca (signos vitales).  Verificar si la bolsa de agua (saco amnitico) se ha roto (ruptura).  Hablar con usted sobre su plan de nacimiento y Chiropractor las opciones para Human resources officer.  Monitoreo Su mdico puede monitorear las contracciones (monitoreo uterino) y el ritmo cardaco del beb (monitoreo fetal). Es posible que el monitoreo se necesite realizar:  Con frecuencia, pero no continuamente (intermitentemente).  Todo el tiempo o durante largos perodos a la vez (continuamente). El monitoreo continuo puede ser necesario si: ? Usted est recibiendo [employer], tales como medicamentos para Engineer, materials o para hacer que las contracciones ms fuertes. ? Tiene complicaciones durante el embarazo o el [place] de [place].  El monitoreo se puede realizar:  Al colocar un estetoscopio especial o un dispositivo manual de monitoreo en el abdomen o verificar los latidos cardacos de su beb y sentir su abdomen para controlar de contracciones. Este mtodo de monitoreo no registra los latidos cardacos de su beb ni sus contracciones de [place] continua.  Colocar monitores en el abdomen (monitores externos) para Passenger transport manager los latidos cardacos de su beb y la frecuencia y duracin de las  contracciones. No tendr que tener colocados los monitores externos en todo momento.  Colocar monitores dentro del tero (monitores internos) para Passenger transport manager los  latidos cardacos de su beb y la frecuencia, duracin y fuerza de sus contracciones. ? Su mdico podr usar monitores internos si necesita ms informacin sobre la fuerza de las contracciones o la frecuencia cardaca del beb. ? Los monitores internos se colocan pasando un cable delgado y flexible a travs de la vagina hasta el tero. Segn el tipo de monitor, Insurance claims handler en el tero o en la cabeza del beb hasta el nacimiento. ? Su mdico analizar con usted los beneficios y los riesgos de usar un monitor interno y Chief Executive Officer pedir permiso antes de Psychologist, occupational.  Telemetra. Se trata de un tipo de monitoreo continuo que se puede Education officer, environmental con monitores externos o internos. En lugar de Hospital doctor en la cama, usted puede moverse durante Fish farm manager. Pregunte a su mdico si la telemetra es una opcin para usted.  Examen fsico. Su mdico puede realizarle un examen fsico. Esto puede incluir lo siguiente:  Verificar en qu posicin se encuentra su beb: ? Con la cabeza hacia la vagina (cabeza abajo). Esta es la posicin ms frecuente. ? Con la cabeza [employer] parte superior del tero (cabeza [place] o de nalgas). Si su beb est en una posicin de nalgas, su mdico puede tratar de hacerlo girar para que quede cabeza abajo a fin de poder tener un parto vaginal. Si parece que su beb no puede nacer con parto vaginal, su mdico puede recomendar una ciruga para dar a luz al beb. En casos muy poco frecuentes, usted puede dar a luz con parto vaginal si el beb est cabeza arriba (parto de nalgas). ? Posicin lateral (transversal). Los bebs que estn en posicin lateral no pueden nacer por parto vaginal.  Verificar el cuello uterino para determinar: ? Si se est afinando o estirando (borrando). ? Si se est abriendo (dilatando). ? Cunto se ha movido o ha bajado su beb por el canal del parto.  Cules son las tres etapas del [place] de [place] y [place]?  El [place] de parto y el  parto normales se dividen en tres etapas: Etapa 1  La etapa 1 es la etapa ms larga del [place] de [place] y puede durar horas o [place]. La etapa 1 incluye: ? Trabajo de parto temprano. Esto es cuando las contracciones pueden ser irregulares o regulares y leves. En general, las contracciones del trabajo de parto temprano se producen con ms de 10 minutos de diferencia. ? Tresa Moore. Esto es cuando las contracciones son ms largas, ms regulares, ms frecuentes y ms intensas. ? La fase de transicin. Esto es cuando las contracciones ocurren muy seguidas, son Lynnae Sandhoff intensas y pueden durar ms que durante cualquier otra parte del [place] de [place].  En general, las contracciones son leves, infrecuentes e irregulares al principio. Se hacen ms fuertes, ms frecuentes (aproximadamente cada 2 o 3 minutos) y ms regulares a medida que usted avanza desde un trabajo de parto temprano [place] un [place] de [place] y fase de transicin.  Muchas mujeres progresan a travs de la etapa 1 de forma natural, pero es posible que usted necesite ayuda para continuar avanzando. Si esto ocurre, su mdico puede hablar con usted sobre lo siguiente: ? La ruptura del saco amnitico si es que an no ha ocurrido. ? Administracin de medicamentos para ayudarla a tener contracciones ms fuertes y ms frecuentes.  La etapa 1 finaliza cuando el cuello uterino est completamente dilatado hasta 4 pulgadas (10cm) y completamente borrado. Esto ocurre al final de la fase de transicin. Etapa 2  Una vez que el cuello uterino est totalmente borrado y dilatado a 4 pulgadas (10cm), usted puede comenzar a sentir ganas de pujar. Es comn que el cuerpo tome un descanso de [place] natural antes de sentir ganas de pujar, especialmente si recibe una epidural u otros medicamentos para Chief Technology Officer. Este perodo de descanso puede durar un mximo de 1 a 2 horas, segn su experiencia de parto nica.  Durante la etapa 2, las contracciones  son generalmente menos doloras porque pujar ayuda a Engineer, materials de las contracciones. En lugar del dolor de las contracciones, puede sentir un dolor urente y por estiramiento, especialmente cuando la parte ms ancha de la cabeza de su beb pasa a travs de la abertura vaginal (coronacin).  Su mdico controlar atentamente su avance con los pujos y el avance del beb a travs de la vagina durante la etapa 2.  Su mdico puede Advice worker rea de la piel entre la abertura vaginal y el ano (perineo) o aplicar compresas tibias en el perineo. Esto ayuda al estiramiento ya que la cabeza del beb empieza a Research officer, trade union, lo cual puede ayudar a evitar un desgarro perineal. ? En algunos casos, se puede hacer una incisin en el peritoneo (episiotoma) para permitir que el beb pase a travs de la abertura vaginal. La episiotoma sirve para agrandar la abertura vaginal a fin de que el beb tenga ms espacio para pasar durante el parto.  Es muy importante respirar y concentrarse para que el mdico pueda controlar la salida de la cabeza del beb. Es posible que su mdico tenga que disminuir la intensidad de los pujos para ayudar a Astronomer perineal.  Despus de que sale la cabeza del beb, en general salen los hombros y el resto del cuerpo muy rpidamente y sin dificultad.  Una vez que el beb nace, se debe cortar el cordn umbilical de inmediato o esto puede demorar 1 o 2 minutos, segn la salud del beb. Este procedimiento puede variar segn el mdico, el hospital y [place] de [place].  Si usted y su beb estn lo suficientemente sanos, se Journalist, newspaper beb en el pecho o el abdomen para ayudar a Tour manager del beb y el vnculo entre ustedes. Algunas madres y bebs comienzan la lactancia en este momento. Su equipo mdico secar al beb y lo ayudar a Research scientist (life sciences) caliente [employer].  Es posible que su beb necesite atencin inmediata si: ? Mostr signos de sufrimiento durante el  trabajo de [place]. ? Tiene una afeccin mdica. ? Naci antes de tiempo (prematuro). ? Defeca antes del nacimiento (meconio). ? Muestra signos de dificultar en la transicin de estar dentro del tero a estar fuera del tero. Si tiene Industrial/product designer, su equipo mdico la ayudar a Lawyer. Etapa 3  La tercera etapa del trabajo de parto comienza inmediatamente despus del nacimiento de su beb y finaliza despus de la expulsin de la placenta. La placenta es un rgano de que desarrolla durante el embarazo para proporcionar oxgeno y nutrientes al beb en el tero.  La expulsin de la placenta puede requerir algunos pujos y es posible que usted tenga contracciones leves. La lactancia puede estimular las contracciones para ayudar a expulsar la placenta.  Luego de la expulsin de la placenta, el tero debe (contraerse) y  quedar muy firme. Esto ayuda a Civil Service fast streamerdetener el sangrado en el tero. Para ayudar al tero a contraerse y controlar el Mentonesangrado, [place] mdico puede: ? Administrarle un medicamento inyectable, a travs de un tubo (catter) intravenoso, por boca o a travs del recto (por va rectal). ? Masajear el abdomen o realizar un examen de la vagina para extraer cualquier cogulo de sangre que quede en el tero. ? Vaciar la vejiga colocando un tubo flexible (catter) en la vejiga. ? Alentarla a amamantar a su beb. Una vez que termina el [place], se los controlar a usted y a su beb atentamente para Warehouse managertener la seguridad de que ambos estn sanos hasta que estn listos para ir a casa. Su equipo de atencin Art gallery managermdica le ensear cmo cuidarse y cuidar a su beb. Esta informacin no tiene Theme park managercomo fin reemplazar el consejo del mdico. Asegrese de hacerle al mdico cualquier pregunta que tenga. Document Released: 02/23/2008 Document Revised: 06/21/2016 Document Reviewed: 03/27/2015 Elsevier Interactive Patient Education  2018 [employer]

## 2017-05-16 LAB — RPR: RPR Ser Ql: NONREACTIVE

## 2017-05-16 MED ORDER — PRENATAL MULTIVITAMIN CH
1.0000 | ORAL_TABLET | Freq: Every day | ORAL | Status: DC
  Administered 2017-05-16 – 2017-05-18 (×3): 1 via ORAL
  Filled 2017-05-16 (×3): qty 1

## 2017-05-16 MED ORDER — ONDANSETRON HCL 4 MG PO TABS: 4.0000 mg | ORAL_TABLET | ORAL | Status: DC | PRN

## 2017-05-16 MED ORDER — DIBUCAINE 1 % RE OINT: 1.0000 "application " | TOPICAL_OINTMENT | RECTAL | Status: DC | PRN

## 2017-05-16 MED ORDER — ACETAMINOPHEN 325 MG PO TABS
650.0000 mg | ORAL_TABLET | ORAL | Status: DC | PRN
  Administered 2017-05-16 – 2017-05-18 (×2): 650 mg via ORAL
  Filled 2017-05-16 (×2): qty 2

## 2017-05-16 MED ORDER — WITCH HAZEL-GLYCERIN EX PADS: 1.0000 "application " | MEDICATED_PAD | CUTANEOUS | Status: DC | PRN

## 2017-05-16 MED ORDER — ZOLPIDEM TARTRATE 5 MG PO TABS: 5.0000 mg | ORAL_TABLET | Freq: Every evening | ORAL | Status: DC | PRN

## 2017-05-16 MED ORDER — COCONUT OIL OIL: 1.0000 "application " | TOPICAL_OIL | Status: DC | PRN

## 2017-05-16 MED ORDER — ONDANSETRON HCL 4 MG/2ML IJ SOLN: 4.0000 mg | INTRAMUSCULAR | Status: DC | PRN

## 2017-05-16 MED ORDER — IBUPROFEN 600 MG PO TABS
600.0000 mg | ORAL_TABLET | Freq: Four times a day (QID) | ORAL | Status: DC
  Administered 2017-05-16 – 2017-05-18 (×8): 600 mg via ORAL
  Filled 2017-05-16 (×8): qty 1

## 2017-05-16 MED ORDER — OXYCODONE HCL 5 MG PO TABS: 5.0000 mg | ORAL_TABLET | ORAL | Status: DC | PRN

## 2017-05-16 MED ORDER — SIMETHICONE 80 MG PO CHEW: 80.0000 mg | CHEWABLE_TABLET | ORAL | Status: DC | PRN

## 2017-05-16 MED ORDER — DIPHENHYDRAMINE HCL 25 MG PO CAPS: 25.0000 mg | ORAL_CAPSULE | Freq: Four times a day (QID) | ORAL | Status: DC | PRN

## 2017-05-16 MED ORDER — SENNOSIDES-DOCUSATE SODIUM 8.6-50 MG PO TABS
2.0000 | ORAL_TABLET | ORAL | Status: DC
  Administered 2017-05-16 – 2017-05-17 (×2): 2 via ORAL
  Filled 2017-05-16 (×2): qty 2

## 2017-05-16 MED ORDER — BENZOCAINE-MENTHOL 20-0.5 % EX AERO
1.0000 "application " | INHALATION_SPRAY | CUTANEOUS | Status: DC | PRN
  Administered 2017-05-16: 1 via TOPICAL
  Filled 2017-05-16: qty 56

## 2017-05-16 MED ORDER — TETANUS-DIPHTH-ACELL PERTUSSIS 5-2.5-18.5 LF-MCG/0.5 IM SUSP: 0.5000 mL | Freq: Once | INTRAMUSCULAR | Status: DC

## 2017-05-16 NOTE — Anesthesia Postprocedure Evaluation (Signed)
Anesthesia Post Note  Patient: Karen Barajas  Procedure(s) Performed: AN AD HOC LABOR EPIDURAL     Patient location during evaluation: Mother Baby Anesthesia Type: Epidural Level of consciousness: awake and alert Pain management: pain level controlled Vital Signs Assessment: post-procedure vital signs reviewed and stable Respiratory status: spontaneous breathing, nonlabored ventilation and respiratory function stable Cardiovascular status: stable Postop Assessment: no headache, no backache, epidural receding, no apparent nausea or vomiting, patient able to bend at knees and adequate PO intake Anesthetic complications: no    Last Vitals:  Vitals:   05/16/17 0900 05/16/17 1200  BP: 125/67 128/70  Pulse: 90 95  Resp: 18 18  Temp: 36.9 C 36.7 C  SpO2: 98% 96%    Last Pain:  Vitals:   05/16/17 1200  TempSrc: Oral  PainSc: 0-No pain   Pain Goal:                 Land O'LakesMalinova,Anthany Thornhill Hristova

## 2017-05-16 NOTE — Progress Notes (Signed)
Patient ID: Loman ChromanSelene Casillas Parra, female   DOB: November 16, 1990, 27 y.o.   MRN: [id number]  Pushing a little over an hour now; making great progress- vtx at +2 station. FHR 120s, +STV, +variables; anticipate SVD.  [name], Achille Xiang CNM 05/16/2017 5:37 AM

## 2017-05-16 NOTE — Consult Note (Signed)
Neonatology Note:  Attendance at Code Apgar:   Our team responded to a Code Apgar call to room # 173 following NSVD, due to infant with apnea. The requesting provider was [name] Shaw CNM. The mother is a G1 , GBS pos aIAP with good PNC. ROM occurred 20 hours PTD and the fluid was clear.  At delivery, the baby had poor tone and resp effort. The OB nursing staff in attendance stopped DCC, gave vigorous stimulation and a Code Apgar was called. Our team arrived at 1.5 minutes of life, at which time the baby was with poor tone, poor resp effort, HR >100. PPV given ~30sec with great response in breathing, grimacing, color and tone. SaO2 unsuccessfully placed. Baby remained stable clinically on RA until 8min of life. Lungs clearing, no wob, good color and pulses.  Ap 3/9.  I spoke with the mother via interpretor in the DR, then transferred the baby to the Pediatrician's care.   Please do not hesitate in contacting us if further concerns.   Marisol Giambra C. Nataleah Scioneaux, MD 

## 2017-05-16 NOTE — Progress Notes (Signed)
Patient ID: Karen Barajas, female   DOB: 1990/08/26, 27 y.o.   MRN: [id number]  Complete/ant lip since 0100; does feel urge to push  BP 123/70, other VSS FHR 120-130s, +accels, +STV, early variables Ctx q 2-4 mins w/ Pit @ 722mu/min Vtx- bones just entering pelvic brim  IUP@term  End 1st stage  Discussed w/ Dr Despina HiddenEure- began pushing at 0415  [name], [name] CNM 05/16/2017 4:35 AM

## 2017-05-17 NOTE — Lactation Note (Signed)
This note was copied from a baby's chart. Lactation Consultation Note  Patient Name: [name] VFIEP'PToday's Date: 05/17/2017 Reason for consult: Initial assessment;Term Spanish telephone interpreter used for visit.  Mom states she prefers to pump and bottle feed.  Symphony pump was set up and initiated by RN earlier today.  Reviewed importance of pumping 8-12 times/24 hours and milk coming to volume usually 3-5 days after birth of baby.  Mom does have WIC and plans on obtaining a pump from them.  Referral faxed to office. Will follow up tomorrow.  Maternal Data Has patient been taught Hand Expression?: Yes  Feeding Feeding Type: Bottle Fed - Formula Nipple Type: Slow - flow  LATCH Score                   Interventions    Lactation Tools Discussed/Used     Consult Status Consult Status: Follow-up Date: 05/18/17 Follow-up type: In-patient    Huston FoleyMOULDEN, Derika Eckles S 05/17/2017, 2:33 PM

## 2017-05-17 NOTE — Progress Notes (Signed)
POSTPARTUM PROGRESS NOTE  Post Partum Day #1 Subjective:  Karen Barajas is a 27 y.o. G1P0000 2082w6d s/p SVD.  No acute events overnight.  Pt denies problems with voiding or po intake. States she has had some dizziness with walking and some L leg cramping with ambulation. She denies nausea or vomiting.  Pain is well controlled. Lochia Moderate.   Objective: Blood pressure 129/77, pulse 86, temperature 97.8 F (36.6 C), temperature source Oral, resp. rate 18, weight 278 lb 11.2 oz (126.4 kg), last menstrual period 06/25/2016, SpO2 97 %.  Physical Exam:  General: alert, cooperative and no distress Chest: no respiratory distress, CTAB Heart:regular rate and rhythm, distal pulses intact Abdomen: soft, nontender Uterine Fundus: firm, appropriately tender DVT Evaluation: No calf swelling or tenderness. Negative Homan's. No erythema. Observed ambulation and was without difficulty. Extremities: no edema  Recent Labs    05/15/17 1118  HGB 12.9  HCT 38.2    Assessment/Plan:  ASSESSMENT: Karen Barajas is a 27 y.o. G1P0000 1482w6d s/p SVD  Plan for discharge tomorrow  Bottle feeding Plan for contraceptive patch once weaned. K pad for leg pain PRN, low suspicion for DVT. Continue to assess.   LOS: 2 days   Kingsley Spittlelison RumballDO 05/17/2017, 7:21 AM

## 2017-05-18 ENCOUNTER — Inpatient Hospital Stay (HOSPITAL_COMMUNITY)
Admission: RE | Admit: 2017-05-18 | Discharge: 2017-05-18 | Disposition: A | Discharge status: Home / Self Care | Payer: Medicaid Other | Source: Ambulatory Visit | Attending: Obstetrics & Gynecology | Admitting: Obstetrics & Gynecology

## 2017-05-18 DIAGNOSIS — O99824 Streptococcus B carrier state complicating childbirth: Secondary | ICD-10-CM

## 2017-05-18 DIAGNOSIS — Z3A40 40 weeks gestation of pregnancy: Secondary | ICD-10-CM

## 2017-05-18 MED ORDER — IBUPROFEN 600 MG PO TABS: 600.0000 mg | ORAL_TABLET | Freq: Four times a day (QID) | ORAL | 0 refills | Status: DC

## 2017-05-18 NOTE — Discharge Instructions (Signed)
Vaginal Delivery, Care After °Refer to this sheet in the next few weeks. These instructions provide you with information about caring for yourself after vaginal delivery. Your health care provider may also give you more specific instructions. Your treatment has been planned according to current medical practices, but problems sometimes occur. Call your health care provider if you have any problems or questions. °What can I expect after the procedure? °After vaginal delivery, it is common to have: °· Some bleeding from your vagina. °· Soreness in your abdomen, your vagina, and the area of skin between your vaginal opening and your anus (perineum). °· Pelvic cramps. °· Fatigue. ° °Follow these instructions at home: °Medicines °· Take over-the-counter and prescription medicines only as told by your health care provider. °· If you were prescribed an antibiotic medicine, take it as told by your health care provider. Do not stop taking the antibiotic until it is finished. °Driving ° °· Do not drive or operate heavy machinery while taking prescription pain medicine. °· Do not drive for 24 hours if you received a sedative. °Lifestyle °· Do not drink alcohol. This is especially important if you are breastfeeding or taking medicine to relieve pain. °· Do not use tobacco products, including cigarettes, chewing tobacco, or e-cigarettes. If you need help quitting, ask your health care provider. °Eating and drinking °· Drink at least 8 eight-ounce glasses of water every day unless you are told not to by your health care provider. If you choose to breastfeed your baby, you may need to drink more water than this. °· Eat high-fiber foods every day. These foods may help prevent or relieve constipation. High-fiber foods include: °? Whole grain cereals and breads. °? Brown rice. °? Beans. °? Fresh fruits and vegetables. °Activity °· Return to your normal activities as told by your health care provider. Ask your health care provider  what activities are safe for you. °· Rest as much as possible. Try to rest or take a nap when your baby is sleeping. °· Do not lift anything that is heavier than your baby or 10 lb (4.5 kg) until your health care provider says that it is safe. °· Talk with your health care provider about when you can engage in sexual activity. This may depend on your: °? Risk of infection. °? Rate of healing. °? Comfort and desire to engage in sexual activity. °Vaginal Care °· If you have an episiotomy or a vaginal tear, check the area every day for signs of infection. Check for: °? More redness, swelling, or pain. °? More fluid or blood. °? Warmth. °? Pus or a bad smell. °· Do not use tampons or douches until your health care provider says this is safe. °· Watch for any blood clots that may pass from your vagina. These may look like clumps of dark red, brown, or black discharge. °General instructions °· Keep your perineum clean and dry as told by your health care provider. °· Wear loose, comfortable clothing. °· Wipe from front to back when you use the toilet. °· Ask your health care provider if you can shower or take a bath. If you had an episiotomy or a perineal tear during labor and delivery, your health care provider may tell you not to take baths for a certain length of time. °· Wear a bra that supports your breasts and fits you well. °· If possible, have someone help you with household activities and help care for your baby for at least a few days after   you leave the hospital. °· Keep all follow-up visits for you and your baby as told by your health care provider. This is important. °Contact a health care provider if: °· You have: °? Vaginal discharge that has a bad smell. °? Difficulty urinating. °? Pain when urinating. °? A sudden increase or decrease in the frequency of your bowel movements. °? More redness, swelling, or pain around your episiotomy or vaginal tear. °? More fluid or blood coming from your episiotomy or  vaginal tear. °? Pus or a bad smell coming from your episiotomy or vaginal tear. °? A fever. °? A rash. °? Little or no interest in activities you used to enjoy. °? Questions about caring for yourself or your baby. °· Your episiotomy or vaginal tear feels warm to the touch. °· Your episiotomy or vaginal tear is separating or does not appear to be healing. °· Your breasts are painful, hard, or turn red. °· You feel unusually sad or worried. °· You feel nauseous or you vomit. °· You pass large blood clots from your vagina. If you pass a blood clot from your vagina, save it to show to your health care provider. Do not flush blood clots down the toilet without having your health care provider look at them. °· You urinate more than usual. °· You are dizzy or light-headed. °· You have not breastfed at all and you have not had a menstrual period for 12 weeks after delivery. °· You have stopped breastfeeding and you have not had a menstrual period for 12 weeks after you stopped breastfeeding. °Get help right away if: °· You have: °? Pain that does not go away or does not get better with medicine. °? Chest pain. °? Difficulty breathing. °? Blurred vision or spots in your vision. °? Thoughts about hurting yourself or your baby. °· You develop pain in your abdomen or in one of your legs. °· You develop a severe headache. °· You faint. °· You bleed from your vagina so much that you fill two sanitary pads in one hour. °This information is not intended to replace advice given to you by your health care provider. Make sure you discuss any questions you have with your health care provider. °Document Released: 03/09/2000 Document Revised: 08/24/2015 Document Reviewed: 03/27/2015 °Elsevier Interactive Patient Education © 2018 Elsevier Inc. ° °

## 2017-05-18 NOTE — Lactation Note (Signed)
This note was copied from a baby's chart. Lactation Consultation Note  Patient Name: [name] ZOXWR'UToday's Date: 05/18/2017 Reason for consult: Follow-up assessment;Term;1st time breastfeeding;Primapara  1253 hours old female who is now exclusively formula fed by his mother, mom changed her mind, she is not going to pump or put baby on the breast, per mom it was "too hard" and she wasn't getting anything with her pumping sessions. Explained Lactogenesis II to mom, mom verbalized understanding but still said that she's just going to do formula.  Reviewed engorgement prevention and treatment and options for milk removal in case she changes her mind and decides to provide breastmilk to baby. Mom is aware of LC services and will call PRN.  Maternal Data Formula Feeding for Exclusion: No Has patient been taught Hand Expression?: Yes Does the patient have breastfeeding experience prior to this delivery?: No    Interventions Interventions: Breast feeding basics reviewed  Lactation Tools Discussed/Used     Consult Status Consult Status: Complete    Karen Barajas 05/18/2017, 11:21 AM

## 2017-05-18 NOTE — Discharge Summary (Signed)
OB Discharge Summary    Patient Name: Karen Barajas DOB: 07-19-1990 MRN: [id number] Date of admission: 05/15/2017  Delivering MD: [name], [name] D )  Date of discharge: 05/18/2017  Admitting diagnosis: Spontaneous Onset of Labor Intrauterine pregnancy: 4519w0d    Secondary diagnosis:  Active Problems:   Patient Active Problem List   Diagnosis Date Noted  . Indication for care in labor or delivery 05/15/2017  . Influenza A 05/02/2017  . Group beta Strep positive 04/25/2017  . Edema during pregnancy in third trimester 04/11/2017  . Significant discrepancy between uterine size and clinical dates, antepartum 03/21/2017  . Maternal morbid obesity, antepartum (HCC) 11/08/2016  . 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 yo G1P0000 at 8319w0d was admitted in Latent Labor on 05/15/2017. Patient had an uncomplicated labor course as follows:  Membrane Rupture Time/Date: 10:09 AM ,05/15/2017   Intrapartum Procedures: Episiotomy: None [1]                                         Lacerations:  2nd degree [3];Perineal [11]  Patient had a delivery of a Viable infant. 05/16/2017  Information for the patient's newborn:  Wende CreaseCasillas Barajas, Boy Kaylei [[id number]][[id number]]  Delivery Method: Vag-Spont    Pateint had an uncomplicated postpartum course.  She is ambulating, tolerating a regular diet, passing flatus, and urinating well. Patient is discharged home in stable condition on 05/18/17.  Physical exam  Vitals:   05/17/17 0520 05/17/17 1714  BP: 129/77 126/76  Pulse: 86 84  Resp:  18 18  Temp: 97.8 F (36.6 C) 97.9 F (36.6 C)  SpO2: 97% 99%    General: alert, cooperative and no distress Lochia: appropriate Uterine Fundus: firm Incision: N/A DVT Evaluation: No evidence of DVT seen on physical exam. No swelling, erythema. Negative Homan's.  Labs: No results found for this or any previous visit (from the past 24 hour(s)).  Discharge instruction: per After Visit Summary and "Baby and Me Booklet".  After visit meds:  Allergies  Allergen Reactions  . Flexeril [Cyclobenzaprine] Itching    Itchy dry throat, chest tightness    Allergies as of 05/18/2017      Reactions   Flexeril [cyclobenzaprine] Itching   Itchy dry throat, chest tightness      Medication List    TAKE these medications   acetaminophen 500 MG tablet Commonly known as:  TYLENOL Take 500 mg by mouth as needed.   ibuprofen 600 MG tablet Commonly known as:  ADVIL,MOTRIN Take 1 tablet (600 mg total) by mouth every  6 (six) hours.   multivitamin-prenatal 27-0.8 MG Tabs tablet Take 1 tablet by mouth daily at 12 noon.       Diet: routine diet  Activity: Advance as tolerated. Pelvic rest for 6 weeks.   Outpatient follow up:6 weeks Future Appointments:  Future Appointments  Date Time Provider Department Center  06/27/2017  2:15 PM Sharyon Cable, CNM [facility]    Follow up Appt: No Follow-up on file.  Postpartum contraception: patch after weaning  Newborn Data: APGAR (1 MIN): 3   APGAR (5 MINS): 9    Baby Feeding: Bottle Disposition:home with mother  Ellwood Dense, DO  05/18/2017

## 2017-06-27 ENCOUNTER — Ambulatory Visit: Payer: Self-pay | Admitting: Certified Nurse Midwife

## 2017-07-05 ENCOUNTER — Ambulatory Visit (INDEPENDENT_AMBULATORY_CARE_PROVIDER_SITE_OTHER): Payer: Self-pay | Admitting: Nurse Practitioner

## 2017-07-05 ENCOUNTER — Encounter: Payer: Self-pay | Admitting: Nurse Practitioner

## 2017-07-05 DIAGNOSIS — K5900 Constipation, unspecified: Secondary | ICD-10-CM

## 2017-07-05 NOTE — Progress Notes (Signed)
Pt states has not stopped bleeding since baby's birth and experiencing some constipation. Wanted to discuss the different Birth Control options.

## 2017-07-05 NOTE — Progress Notes (Signed)
Subjective:     Karen Barajas is a 27 y.o. female who presents for a postpartum visit. She is 7 weeks postpartum following a spontaneous vaginal delivery. I have fully reviewed the prenatal and intrapartum course. The delivery was at 40/6 gestational weeks. Outcome: spontaneous vaginal delivery. Anesthesia: epidural. Postpartum course has been good. Baby's course has been unremarkable. Baby is feeding by bottle Rush Barer- Gerber. Bleeding moderate lochia. Bowel function is abnormal and client has had some problems with constipation.  She is using high fiiber foods and OTC stool softeners.. Bladder function is normal. Patient is not sexually active. Contraception method is none and she currently does not have a sex partner.  Agrees to come in for a visit at least 4 weeks prior to becoming sexually active to begin contraception.  Postpartum depression screening: negative.  The following portions of the patient's history were reviewed and updated as appropriate: allergies, medications, problem list and prior history and family history.  Review of Systems Pertinent items noted in HPI and remainder of comprehensive ROS otherwise negative.   Objective:    LMP 06/25/2016 (Approximate)   General:  alert and cooperative, good hygiene   Breasts:  inspection negative, no nipple discharge or bleeding, no masses or nodularity palpable and .  Lungs: clear to auscultation bilaterally  Heart:  regular rate and rhythm, S1, S2 normal, no murmur, click, rub or gallop   Abdomen: soft, non-tender; bowel sounds normal; no masses,  no organomegaly   Pelvic exam deferred. Assessment:    Normal postpartum exam. Pap smear not done at today's visit. Last pap was 10-31-16 and was negative. Obesity Constipation  Plan:    1. Contraception: abstinence 2. Reviewed diet for constipation and encouraged client to keep doing the kinds of things she is doing.  Advised not be try to conceive for 18 months for her best  health. 3. Follow up in: 12 months or as needed.   See also the AVS for additional information given to client.

## 2017-07-05 NOTE — Patient Instructions (Addendum)
Drink 64 ounces of water every day.  Hacer ejercicio para Psychiatric nursebajar de peso (Exercising to Owens & MinorLose Weight) Hacer ejercicio puede ayudarlo a bajar de peso. Para bajar de Whole Foodspeso mediante el ejercicio, este debe ser de intensidad vigorosa. Puede saber que est haciendo ejercicio de intensidad vigorosa si respira con mucha dificultad y rapidez, y no puede mantener una conversacin. El ejercicio de intensidad moderada ayuda a Radio producermantener el peso actual. Puede saber que est haciendo ejercicio de intensidad moderada si tiene una frecuencia cardaca ms elevada y [place] respiracin ms rpida, pero an puede Diplomatic Services operational officermantener una conversacin. CON QU FRECUENCIA DEBO HACER EJERCICIO? Elija una actividad que disfrute y establezca objetivos realistas. El mdico puede ayudarlo a Event organiserelaborar un plan de actividades que funcione para usted. Haga ejercicio regularmente como se lo haya indicado el mdico. Esta puede incluir:  Programme researcher, broadcasting/film/videoealizar entrenamiento de resistencia dos veces por semana, como: ? Flexiones de First Data Corporationbrazos. ? Abdominales. ? Levantamiento de pesas. ? Ejercicios con bandas elsticas.  Realizar una intensidad determinada de ejercicio durante una cantidad determinada de Sanfordtiempo. Elija entre estas opciones: ? 150minutos de ejercicio de intensidad moderada cada semana. ? 75minutos de ejercicio de intensidad vigorosa cada semana. ? Burlene ArntUna mezcla de ejercicio de intensidad moderada y vigorosa cada semana. Los nios, las mujeres Bradfordvilleembarazadas, las personas que no estn en forma, las personas con sobrepeso y los adultos mayores tal vez tengan que consultar a un mdico para que les d Medical laboratory scientific officerrecomendaciones individuales. Si tiene Owens-Illinoisalguna enfermedad, asegrese de Science writerconsultar al mdico antes de comenzar un programa de ejercicios nuevo. CULES SON ALGUNAS ACTIVIDADES QUE PUEDEN AYUDARME A BAJAR DE PESO?  Caminar a un ritmo de al menos 4,555millas (7kilmetros) por hora.  Trotar o correr a un ritmo de 5 millas (8 kilmetros) por hora.  Andar en  bicicleta a un ritmo de al menos 10 millas (16 kilmetros) por hora.  Practicar natacin.  Practicar patinaje sobre ruedas normales o en lnea.  Hacer esqu de fondo.  Hacer deportes competitivos vigorosos, como ftbol americano, bsquet y ftbol.  Saltar la soga.  Tomar clases de baile aerbico. CMO PUEDO SER MS ACTIVO EN MIS ACTIVIDADES DIARIAS?  Utilice las Microbiologistescaleras en lugar del ascensor.  D una caminata durante su hora de almuerzo.  Si conduce, estacione el automvil ms lejos del trabajo o de la escuela.  Si Bots[place]ausa transporte pblico, bjese una parada antes y camine el resto del camino.  Pngase de pie y camine cada vez que haga llamadas telefnicas.  Levntese, estrese y camine cada 30minutos a lo largo del Futures traderda. QU PAUTAS DEBO SEGUIR MIENTRAS HAGO EJERCICIO?  No haga ejercicio en exceso que pudiera hacer que se lastime, se sienta mareado o tenga dificultad para respirar.  Consulte al mdico antes de comenzar un programa de ejercicios nuevo.  Use ropa cmoda y calzado con buen soporte.  Beba gran cantidad de agua mientras hace ejercicios para evitar la deshidratacin o los golpes de Airline pilotcalor. Durante la actividad fsica se pierde agua corporal que se debe reponer.  Haga ejercicio hasta que se acelere su respiracin y sus latidos cardacos. Esta informacin no tiene Theme park managercomo fin reemplazar el consejo del mdico. Asegrese de hacerle al mdico cualquier pregunta que tenga. Document Released: 06/16/2010 Document Revised: 04/02/2014 Document Reviewed: 08/13/2013 Elsevier Interactive Patient Education  [employer]2018 Elsevier Inc.

## 2018-01-21 ENCOUNTER — Encounter (HOSPITAL_COMMUNITY): Payer: Self-pay | Admitting: Student

## 2018-01-21 ENCOUNTER — Inpatient Hospital Stay (HOSPITAL_COMMUNITY)
Admission: AD | Admit: 2018-01-21 | Discharge: 2018-01-21 | Discharge status: Against Medical Advice | Payer: Self-pay | Source: Ambulatory Visit | Attending: Obstetrics & Gynecology | Admitting: Obstetrics & Gynecology

## 2018-01-21 NOTE — MAU Note (Signed)
Pt not in lobby x3. Assume pt eloped without being triaged.

## 2018-01-21 NOTE — MAU Note (Signed)
Not in lobby x2.

## 2018-01-21 NOTE — MAU Note (Signed)
Not in lobby x1  

## 2018-09-05 IMAGING — US US MFM OB FOLLOW-UP
1 series · 13 of 28 positions shown · non-contrast
Comparison: none

[Series 1: us mfm ob follow-up · 34 acquisitions, 13 frames shown]
[im 2/34]
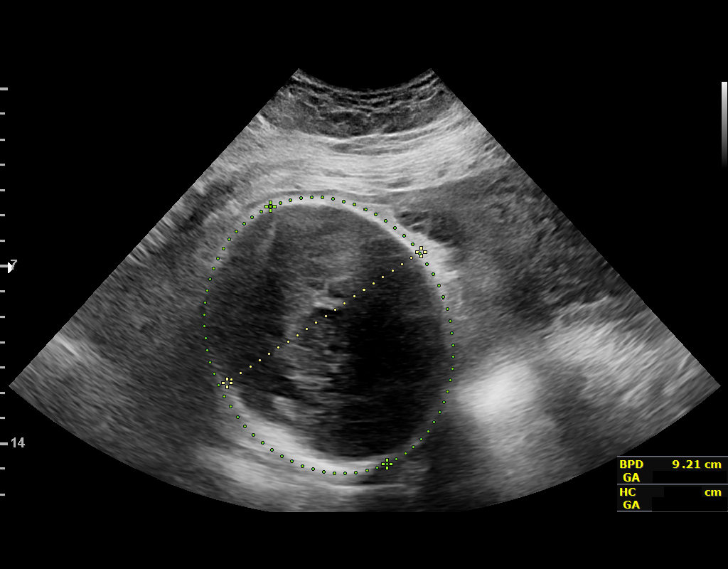
[im 4/34]
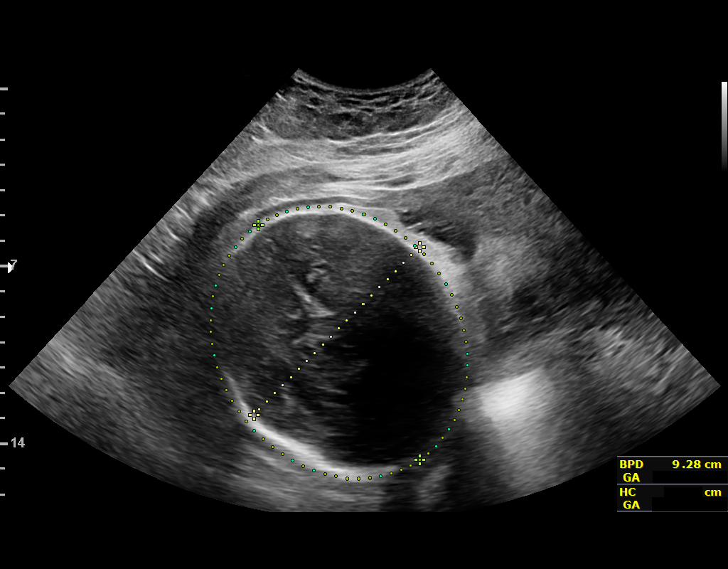
[im 7/34]
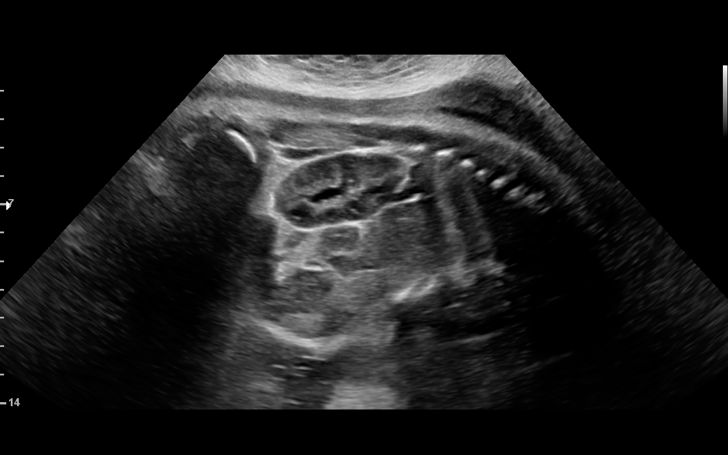
[im 9/34]
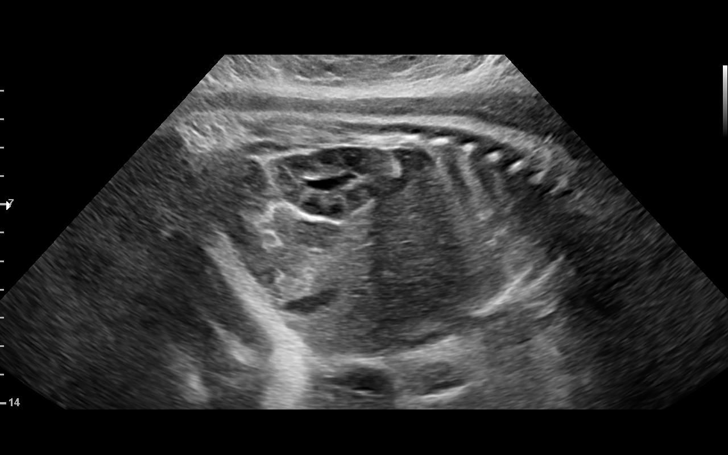
[im 12/34]
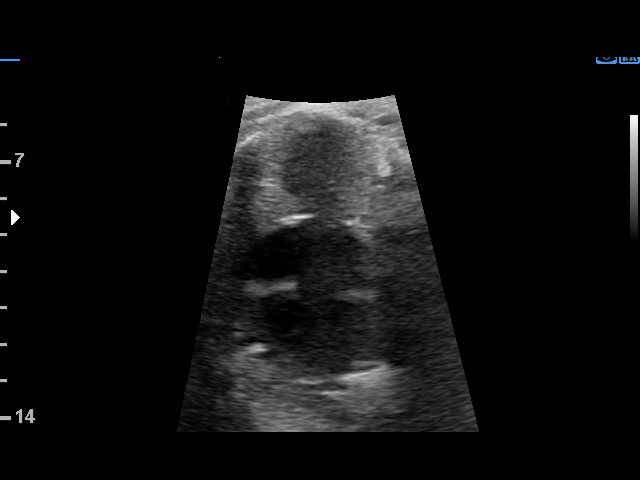
[im 14/34]
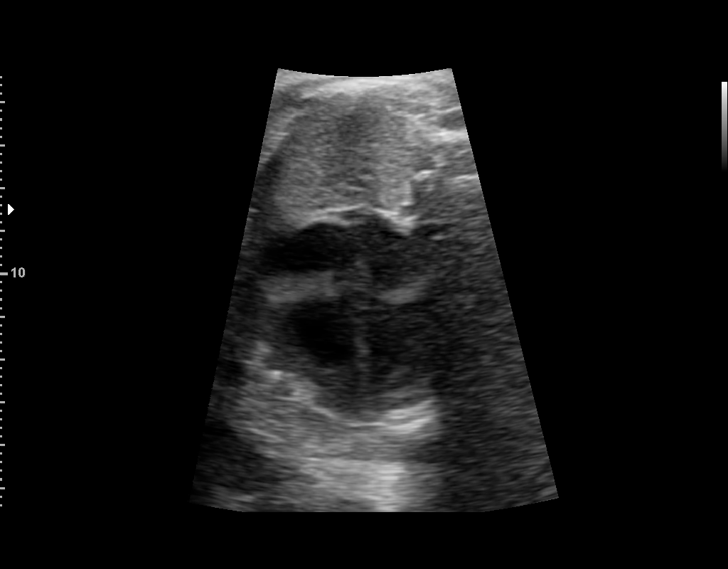
[im 18/34]
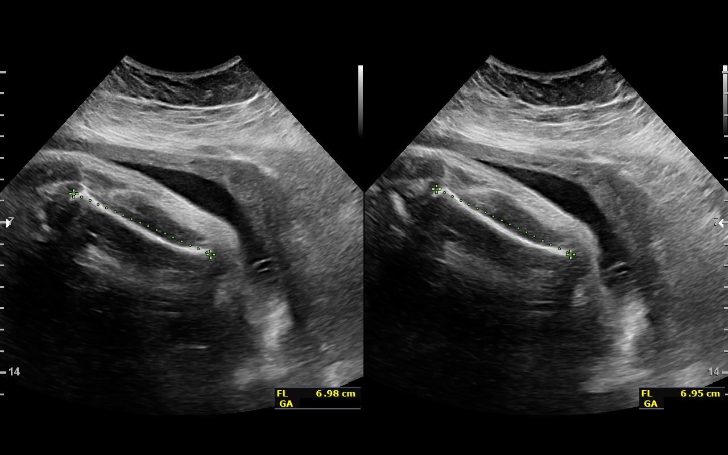
[im 20/34]
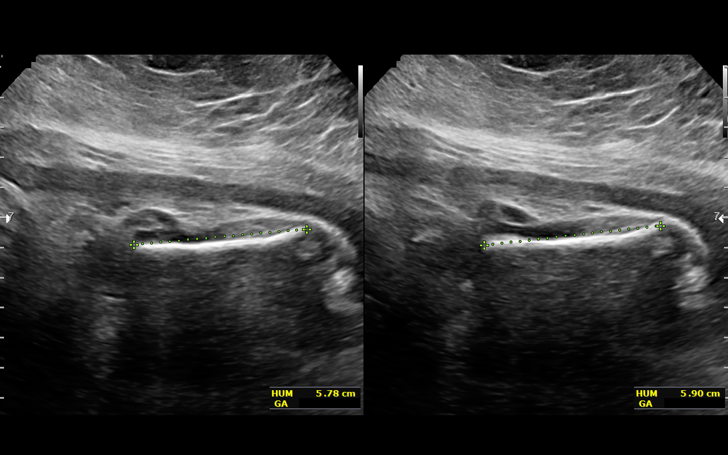
[im 23/34]
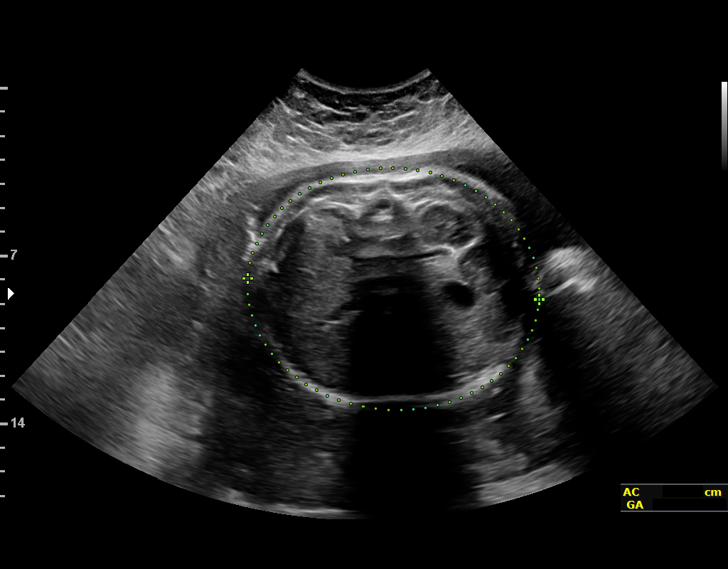
[im 25/34]
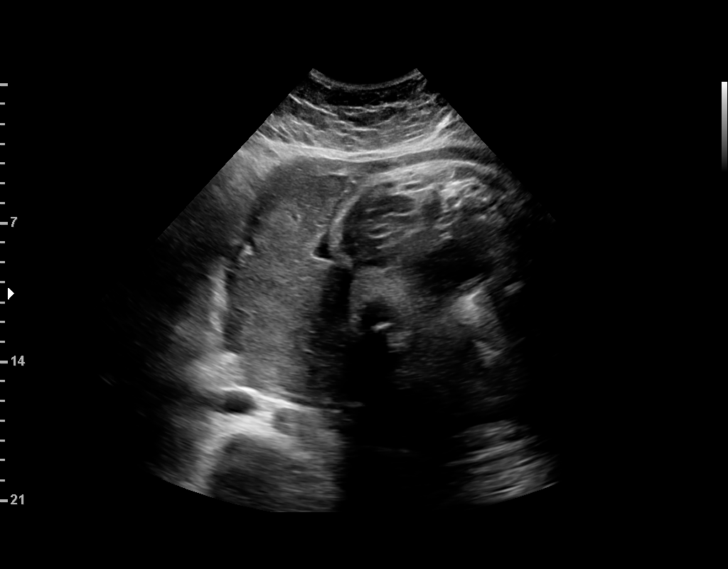
[im 27/34]
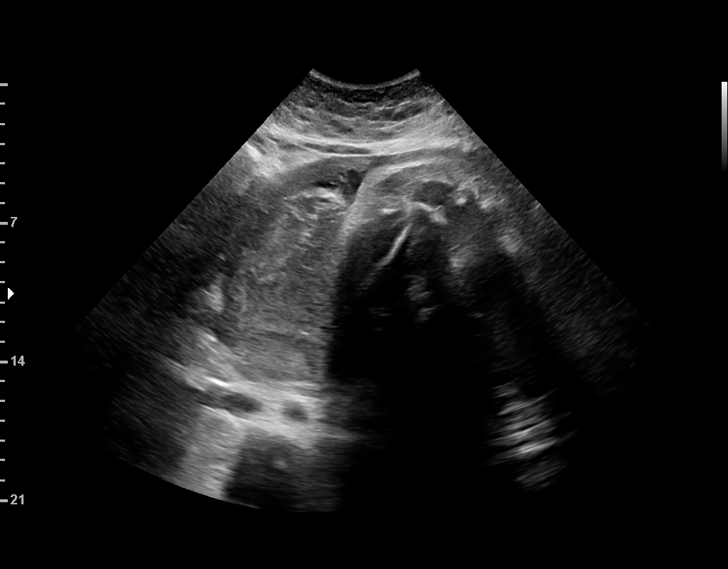
[im 30/34]
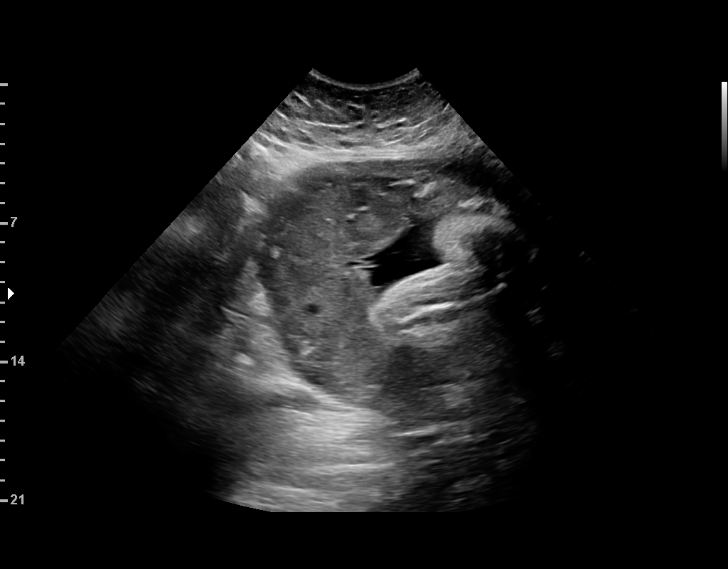
[im 32/34]
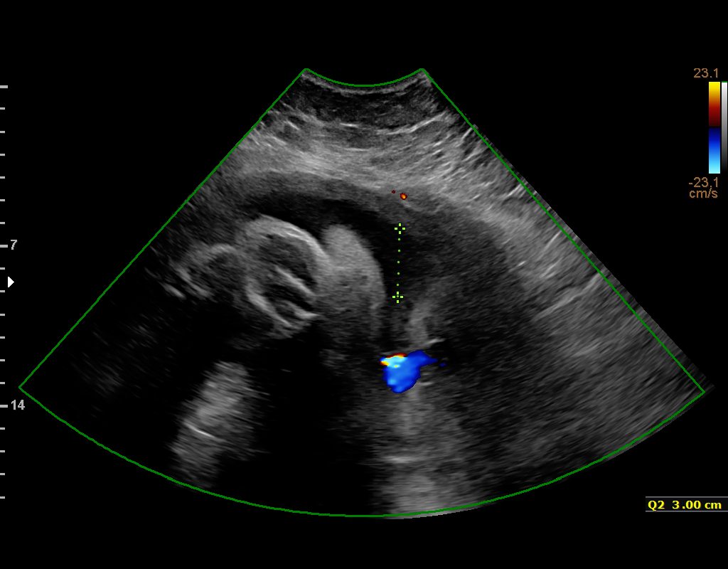

[13 of 28 positions shown; findings below may reference images not displayed]

OB/Gyn Clinic

1  XINIA GEIER         [id number]      [id number]     [id number]
Indications

37 weeks gestation of pregnancy
Obesity complicating pregnancy, third
trimester
Uterine size-date discrepancy, third trimester
Encounter for other antenatal screening
follow-up
OB History

Blood Type:            Height:  5'1"   Weight (lb):  260       BMI:
lb
Gravidity:    1
Fetal Evaluation

Num Of Fetuses:     1
Fetal Heart         130
Rate(bpm):
Cardiac Activity:   Observed
Presentation:       Cephalic
Placenta:           Posterior, above cervical os
P. Cord Insertion:  Previously Visualized

Amniotic Fluid
AFI FV:      Subjectively within normal limits

AFI Sum(cm)     %Tile       Largest Pocket(cm)
8.58            14
RUQ(cm)       RLQ(cm)       LUQ(cm)        LLQ(cm)
1.77          3.81          3              0
Biometry

BPD:      92.2  mm     G. Age:  37w 3d         72  %    CI:        79.73   %    70 - 86
FL/HC:      21.4   %    20.8 -
HC:      326.3  mm     G. Age:  37w 0d         21  %    HC/AC:      0.93        0.92 -
AC:      350.3  mm     G. Age:  39w 0d         94  %    FL/BPD:     75.7   %    71 - 87
FL:       69.8  mm     G. Age:  35w 6d         17  %    FL/AC:      19.9   %    20 - 24
HUM:      58.4  mm     G. Age:  33w 6d          6  %

Est. FW:    1144  gm      7 lb 5 oz     81  %
Gestational Age

LMP:           43w 0d        Date:  06/25/16                 EDD:   04/01/17
U/S Today:     37w 2d                                        EDD:   05/11/17
Best:          37w 2d     Det. By:  U/S C R L  (10/31/16)    EDD:   05/11/17
Anatomy

Cranium:               Appears normal         Aortic Arch:            Previously seen
Cavum:                 Previously seen        Ductal Arch:            Previously seen
Ventricles:            Previously seen        Diaphragm:              Previously seen
Choroid Plexus:        Previously seen        Stomach:                Appears normal, left
sided
Cerebellum:            Previously seen        Abdomen:                Appears normal
Posterior Fossa:       Previously seen        Abdominal Wall:         Previously seen
Nuchal Fold:           Not applicable (>20    Cord Vessels:           Previously seen
wks GA)
Face:                  Orbits and profile     Kidneys:                Appear normal
previously seen
Lips:                  Previously seen        Bladder:                Appears normal
Thoracic:              Previously seen        Spine:                  Previously seen
Heart:                 Appears normal         Upper Extremities:      Previously seen
(4CH, axis, and situs
RVOT:                  Previously seen        Lower Extremities:      Previously seen
LVOT:                  Previously seen

Other:  Fetus appears to be a male prev seen. Technically difficult due to
advanced GA, maternal habitus and fetal position.
Cervix Uterus Adnexa

Cervix
Not visualized (advanced GA >05wks)
Impression

SIUP at 37+2 weeks with cardiac activity
Cephalic presentation
Normal interval anatomy; anatomic survey complete
Normal amniotic fluid volume
Appropriate interval growth with EFW at the 81st %tile
Recommendations

Follow-up as clinically indicated

## 2018-09-24 ENCOUNTER — Emergency Department (HOSPITAL_COMMUNITY): Payer: Medicaid Other

## 2018-09-24 ENCOUNTER — Other Ambulatory Visit: Payer: Self-pay

## 2018-09-24 ENCOUNTER — Encounter (HOSPITAL_COMMUNITY): Payer: Self-pay

## 2018-09-24 ENCOUNTER — Inpatient Hospital Stay (HOSPITAL_COMMUNITY)
Admission: EM | Admit: 2018-09-24 | Discharge: 2018-09-26 | DRG: 177 | Disposition: A | Discharge status: Home / Self Care | Payer: Medicaid Other | Attending: Internal Medicine | Admitting: Internal Medicine

## 2018-09-24 DIAGNOSIS — U071 COVID-19: Secondary | ICD-10-CM | POA: Diagnosis not present

## 2018-09-24 DIAGNOSIS — E860 Dehydration: Secondary | ICD-10-CM | POA: Diagnosis present

## 2018-09-24 DIAGNOSIS — Z6841 Body Mass Index (BMI) 40.0 and over, adult: Secondary | ICD-10-CM

## 2018-09-24 DIAGNOSIS — Z8349 Family history of other endocrine, nutritional and metabolic diseases: Secondary | ICD-10-CM

## 2018-09-24 DIAGNOSIS — Z20822 Contact with and (suspected) exposure to covid-19: Secondary | ICD-10-CM | POA: Diagnosis present

## 2018-09-24 DIAGNOSIS — J1289 Other viral pneumonia: Secondary | ICD-10-CM | POA: Diagnosis present

## 2018-09-24 DIAGNOSIS — Z888 Allergy status to other drugs, medicaments and biological substances status: Secondary | ICD-10-CM

## 2018-09-24 DIAGNOSIS — Z791 Long term (current) use of non-steroidal anti-inflammatories (NSAID): Secondary | ICD-10-CM

## 2018-09-24 DIAGNOSIS — R000 Tachycardia, unspecified: Secondary | ICD-10-CM

## 2018-09-24 DIAGNOSIS — Z114 Encounter for screening for human immunodeficiency virus [HIV]: Secondary | ICD-10-CM

## 2018-09-24 DIAGNOSIS — F419 Anxiety disorder, unspecified: Secondary | ICD-10-CM | POA: Diagnosis present

## 2018-09-24 DIAGNOSIS — Z79899 Other long term (current) drug therapy: Secondary | ICD-10-CM

## 2018-09-24 DIAGNOSIS — Z833 Family history of diabetes mellitus: Secondary | ICD-10-CM

## 2018-09-24 DIAGNOSIS — D72810 Lymphocytopenia: Secondary | ICD-10-CM | POA: Diagnosis present

## 2018-09-24 LAB — CBC WITH DIFFERENTIAL/PLATELET
Abs Immature Granulocytes: 0.03 10*3/uL (ref 0.00–0.07)
Basophils Absolute: 0 10*3/uL (ref 0.0–0.1)
Basophils Relative: 0 %
Eosinophils Absolute: 0.1 10*3/uL (ref 0.0–0.5)
Eosinophils Relative: 1 %
HCT: 42.1 % (ref 36.0–46.0)
Hemoglobin: 13.4 g/dL (ref 12.0–15.0)
Immature Granulocytes: 0 %
Lymphocytes Relative: 8 %
Lymphs Abs: 0.5 10*3/uL — ABNORMAL LOW (ref 0.7–4.0)
MCH: 27 pg (ref 26.0–34.0)
MCHC: 31.8 g/dL (ref 30.0–36.0)
MCV: 84.7 fL (ref 80.0–100.0)
Monocytes Absolute: 0.6 10*3/uL (ref 0.1–1.0)
Monocytes Relative: 9 %
Neutro Abs: 5.6 10*3/uL (ref 1.7–7.7)
Neutrophils Relative %: 82 %
Platelets: 208 10*3/uL (ref 150–400)
RBC: 4.97 MIL/uL (ref 3.87–5.11)
RDW: 14.2 % (ref 11.5–15.5)
WBC: 6.8 10*3/uL (ref 4.0–10.5)
nRBC: 0 % (ref 0.0–0.2)

## 2018-09-24 LAB — SARS CORONAVIRUS 2 BY RT PCR (HOSPITAL ORDER, PERFORMED IN ~~LOC~~ HOSPITAL LAB): SARS Coronavirus 2: POSITIVE — AB

## 2018-09-24 LAB — COMPREHENSIVE METABOLIC PANEL
ALT: 27 U/L (ref 0–44)
AST: 26 U/L (ref 15–41)
Albumin: 3.9 g/dL (ref 3.5–5.0)
Alkaline Phosphatase: 75 U/L (ref 38–126)
Anion gap: 11 (ref 5–15)
BUN: 11 mg/dL (ref 6–20)
CO2: 20 mmol/L — ABNORMAL LOW (ref 22–32)
Calcium: 9 mg/dL (ref 8.9–10.3)
Chloride: 104 mmol/L (ref 98–111)
Creatinine, Ser: 0.61 mg/dL (ref 0.44–1.00)
GFR calc Af Amer: >60 mL/min (ref >60)
GFR calc non Af Amer: >60 mL/min (ref >60)
Glucose, Bld: 119 mg/dL — ABNORMAL HIGH (ref 70–99)
Potassium: 4.2 mmol/L (ref 3.5–5.1)
Sodium: 135 mmol/L (ref 135–145)
Total Bilirubin: 0.4 mg/dL (ref 0.3–1.2)
Total Protein: 7.2 g/dL (ref 6.5–8.1)

## 2018-09-24 MED ORDER — KETOROLAC TROMETHAMINE 15 MG/ML IJ SOLN
15.0000 mg | Freq: Once | INTRAMUSCULAR | Status: AC
  Administered 2018-09-24: 15 mg via INTRAVENOUS
  Filled 2018-09-24: qty 1

## 2018-09-24 MED ORDER — ACETAMINOPHEN 325 MG PO TABS
650.0000 mg | ORAL_TABLET | Freq: Once | ORAL | Status: AC
  Administered 2018-09-24: 650 mg via ORAL
  Filled 2018-09-24: qty 2

## 2018-09-24 MED ORDER — SODIUM CHLORIDE 0.9 % IV BOLUS
1000.0000 mL | Freq: Once | INTRAVENOUS | Status: AC
  Administered 2018-09-24: 1000 mL via INTRAVENOUS

## 2018-09-24 NOTE — ED Notes (Signed)
X-ray at bedside

## 2018-09-24 NOTE — H&P (Signed)
History and Physical    Karen ChromanSelene Casillas Barajas ZOX:[id number] DOB: Sep 20, 1990 DOA: 09/24/2018  PCP: Patient, No Pcp Per Patient coming from: Home  Chief Complaint: Fevers, cough  HPI: Karen Barajas is a 28 y.o. female with a past medical history of morbid obesity presenting to the hospital for evaluation of fevers and cough.  Spanish interpreter services used.  Patient states since last night she has been having fevers, cough, and body aches.  Her chest hurts only when she coughs.  Does report exposure to an individual with COVID-19.  She has been feeling nauseous but has not vomited.  She is not having any abdominal pain or diarrhea.  She is not feeling short of breath.  ED Course: Temperature 100.2 F.  Tachycardic with heart rate up to 140s.  Not hypotensive.  Not hypoxic.  COVID-19 rapid test positive.  No leukocytosis.  Labs suggestive of lymphopenia.  UA pending. Chest x-ray showing no active disease.  Patient received Tylenol, Toradol, and 1 L IV fluid bolus in the ED.  Review of Systems:  All systems reviewed and apart from history of presenting illness, are negative.  Past Medical History:  Diagnosis Date  . UTI (lower urinary tract infection)     Past Surgical History:  Procedure Laterality Date  . NO PAST SURGERIES       reports that she has never smoked. She has never used smokeless tobacco. She reports that she does not drink alcohol or use drugs.  Allergies  Allergen Reactions  . Flexeril [Cyclobenzaprine] Itching    Itchy dry throat, chest tightness    Family History  Problem Relation Age of Onset  . Diabetes Maternal Grandmother   . Hyperlipidemia Maternal Grandfather   . Cancer Paternal Grandfather     Prior to Admission medications   Medication Sig Start Date End Date Taking? Authorizing Provider  acetaminophen (TYLENOL) 500 MG tablet Take 500 mg by mouth as needed.    [provider]  ibuprofen (ADVIL,MOTRIN) 600 MG tablet Take 1 tablet  (600 mg total) by mouth every 6 (six) hours. 05/18/17   Ellwood Denseumball, Alison, DO  Prenatal Vit-Fe Fumarate-FA (MULTIVITAMIN-PRENATAL) 27-0.8 MG TABS tablet Take 1 tablet by mouth daily at 12 noon.    [provider]    Physical Exam: Vitals:   09/25/18 0200 09/25/18 0230 09/25/18 0400 09/25/18 0450  BP: 132/83 138/86 134/81   Pulse: 100 (!) 101 (!) 110   Resp: 19 (!) 22 (!) 22   Temp:    (!) 101.7 F (38.7 C)  TempSrc:      SpO2: 95% 95% 97%   Weight:   (!) 156.7 kg   Height:   5' 1.42" (1.56 m)     Physical Exam  Constitutional: She is oriented to person, place, and time. She appears well-developed and well-nourished. No distress.  Morbidly obese  HENT:  Head: Normocephalic.  Dry mucous membranes  Eyes: Right eye exhibits no discharge. Left eye exhibits no discharge.  Neck: Neck supple.  Cardiovascular: Regular rhythm and intact distal pulses.  Tachycardic  Pulmonary/Chest: Effort normal and breath sounds normal. No respiratory distress. She has no wheezes. She has no rales.  Abdominal: Soft. Bowel sounds are normal. She exhibits no distension. There is no abdominal tenderness. There is no guarding.  Musculoskeletal:        General: No edema.  Neurological: She is alert and oriented to person, place, and time.  Skin: Skin is warm and dry. She is not diaphoretic.  Labs on Admission: I have personally reviewed following labs and imaging studies  CBC: Recent Labs  Lab 09/24/18 2122  WBC 6.8  NEUTROABS 5.6  HGB 13.4  HCT 42.1  MCV 84.7  PLT 528   Basic Metabolic Panel: Recent Labs  Lab 09/24/18 2122  NA 135  K 4.2  CL 104  CO2 20*  GLUCOSE 119*  BUN 11  CREATININE 0.61  CALCIUM 9.0   GFR: Estimated Creatinine Clearance: 153.4 mL/min (by C-G formula based on SCr of 0.61 mg/dL). Liver Function Tests: Recent Labs  Lab 09/24/18 2122  AST 26  ALT 27  ALKPHOS 75  BILITOT 0.4  PROT 7.2  ALBUMIN 3.9   No results for input(s): LIPASE, AMYLASE in  the last 168 hours. No results for input(s): AMMONIA in the last 168 hours. Coagulation Profile: No results for input(s): INR, PROTIME in the last 168 hours. Cardiac Enzymes: No results for input(s): CKTOTAL, CKMB, CKMBINDEX, TROPONINI in the last 168 hours. BNP (last 3 results) No results for input(s): PROBNP in the last 8760 hours. HbA1C: No results for input(s): HGBA1C in the last 72 hours. CBG: No results for input(s): GLUCAP in the last 168 hours. Lipid Profile: No results for input(s): CHOL, HDL, LDLCALC, TRIG, CHOLHDL, LDLDIRECT in the last 72 hours. Thyroid Function Tests: Recent Labs    09/25/18 0151  TSH 0.897  FREET4 0.57*   Anemia Panel: Recent Labs    09/25/18 0151  FERRITIN 26   Urine analysis:    Component Value Date/Time   COLORURINE YELLOW 09/24/2018 0051   APPEARANCEUR CLEAR 09/24/2018 0051   LABSPEC 1.009 09/24/2018 0051   PHURINE 6.0 09/24/2018 0051   GLUCOSEU NEGATIVE 09/24/2018 0051   HGBUR NEGATIVE 09/24/2018 0051   BILIRUBINUR NEGATIVE 09/24/2018 0051   KETONESUR NEGATIVE 09/24/2018 0051   PROTEINUR NEGATIVE 09/24/2018 0051   UROBILINOGEN 0.2 10/31/2016 1400   NITRITE POSITIVE (A) 09/24/2018 0051   LEUKOCYTESUR NEGATIVE 09/24/2018 0051    Radiological Exams on Admission: Dg Chest Port 1 View  Result Date: 09/24/2018 CLINICAL DATA:  Cough and fever EXAM: PORTABLE CHEST 1 VIEW COMPARISON:  None. FINDINGS: Cardiac shadow is within normal limits. The lungs are clear. No bony abnormality is noted. IMPRESSION: No active disease. Electronically Signed   By: Inez Catalina M.D.   On: 09/24/2018 21:00    EKG: Independently reviewed.  Sinus tachycardia (heart rate 113).  Assessment/Plan Principal Problem:   COVID-19 virus infection Active Problems:   Sinus tachycardia   Encounter for screening for HIV   COVID-19 viral infection Presenting with complaints of fevers, cough, and body aches.  SARS-CoV-2 test positive. Temperature 100.2 F.   Tachycardic. Not hypotensive.  Not hypoxic.  Labs suggestive of lymphopenia.  Chest x-ray showing clear lungs.  CRP 3.6.  D-dimer 1.26.  Remainder of inflammatory markers normal. -Transfer to [place] -Solu-Medrol 1 mg/kg/day divided in 2 doses -Currently does not meet criteria for Remdesivir given no hypoxia or infiltrates on chest x-ray. -Avoid NSAIDs -Tylenol PRN -Antitussive PRN -Airborne and contact precautions -Continuous pulse ox -Supplemental oxygen if needed  Sinus tachycardia TSH normal.  Appeared dry on exam and received 2.5 L fluid boluses but continues to be persistently tachycardic with only slight improvement in heart rate.  There remains concern for possible PE given elevated d-dimer and COVID-19 viral infection. -Cardiac monitoring -CT angiogram chest to rule out PE  HIV screening The patient falls between the ages of 13-64 and should be screened for HIV, therefore HIV testing ordered.  DVT prophylaxis: Lovenox Code Status: Full code Family Communication: No family available. Disposition Plan: Anticipate discharge after clinical improvement. Consults called: None Admission status: It is my clinical opinion that referral for OBSERVATION is reasonable and necessary in this patient based on the above information provided. The aforementioned taken together are felt to place the patient at high risk for further clinical deterioration. However it is anticipated that the patient may be medically stable for discharge from the hospital within 24 to 48 hours.  The medical decision making on this patient was of high complexity and the patient is at high risk for clinical deterioration, therefore this is a level 3 visit.  John GiovanniVasundhra Esteven Overfelt MD [facility] Pager [phone]  If 7PM-7AM, please contact night-coverage [email or web address] Password [email or web address]  09/25/2018, 5:47 AM

## 2018-09-24 NOTE — ED Notes (Signed)
ED Provider at bedside. 

## 2018-09-24 NOTE — ED Provider Notes (Signed)
[facility] EMERGENCY DEPARTMENT Provider Note   CSN: [id number] Arrival date & time: 09/24/18  1715   History   Chief Complaint Chief Complaint  Patient presents with  . Fever  . Cough    HPI Karen Barajas is a 28 y.o. female who presents with fever and cough.  No significant past medical history.  The patient states that since yesterday she has had fever, chills, headache, coughing.  She reports that 1 of her friends was diagnosed with coronavirus.  She has been taking ibuprofen for the fever and body aches.  Tonight she decided to come in because of high fever of 103 and chest pain with coughing.  The chest pain is currently better.  She denies shortness of breath, abdominal pain, vomiting, diarrhea.  She had some nausea in the waiting room and tried to throw up however 1 was unable to.  The feeling of nausea has passed now.  She denies any urinary symptoms.     HPI  Past Medical History:  Diagnosis Date  . UTI (lower urinary tract infection)     Patient Active Problem List   Diagnosis Date Noted  . Morbid obesity ([place]) 07/05/2017    Past Surgical History:  Procedure Laterality Date  . NO PAST SURGERIES       OB History    Gravida  1   Para  1   Term  1   Preterm  0   AB  0   Living  1     SAB  0   TAB  0   Ectopic  0   Multiple  0   Live Births  0            Home Medications    Prior to Admission medications   Medication Sig Start Date End Date Taking? Authorizing Provider  acetaminophen (TYLENOL) 500 MG tablet Take 500 mg by mouth as needed.    [provider]  ibuprofen (ADVIL,MOTRIN) 600 MG tablet Take 1 tablet (600 mg total) by mouth every 6 (six) hours. 05/18/17   Rory Percy, DO  Prenatal Vit-Fe Fumarate-FA (MULTIVITAMIN-PRENATAL) 27-0.8 MG TABS tablet Take 1 tablet by mouth daily at 12 noon.    [provider]    Family History Family History  Problem Relation Age of Onset  .  Diabetes Maternal Grandmother   . Hyperlipidemia Maternal Grandfather   . Cancer Paternal Grandfather     Social History Social History   Tobacco Use  . Smoking status: Never Smoker  . Smokeless tobacco: Never Used  Substance Use Topics  . Alcohol use: No  . Drug use: No     Allergies   Flexeril [cyclobenzaprine]   Review of Systems Review of Systems  Constitutional: Positive for chills and fever.  Respiratory: Positive for cough. Negative for shortness of breath.   Cardiovascular: Positive for chest pain.  Gastrointestinal: Positive for nausea. Negative for abdominal pain, diarrhea and vomiting.  Genitourinary: Negative for dysuria.  Musculoskeletal: Positive for back pain and myalgias.  Neurological: Positive for headaches.  All other systems reviewed and are negative.    Physical Exam Updated Vital Signs BP (!) 151/138   Pulse (!) 133   Temp 100.2 F (37.9 C) (Oral)   Resp (!) 21   Ht 5\' 1"  (1.549 m)   Wt 120 kg   LMP 03/26/2018   SpO2 96%   BMI 49.99 kg/m   Physical Exam Vitals signs and nursing note reviewed.  Constitutional:  General: She is not in acute distress.    Appearance: She is well-developed. She is obese. She is not ill-appearing.  HENT:     Head: Normocephalic and atraumatic.  Eyes:     General: No scleral icterus.       Right eye: No discharge.        Left eye: No discharge.     Conjunctiva/sclera: Conjunctivae normal.     Pupils: Pupils are equal, round, and reactive to light.  Neck:     Musculoskeletal: Normal range of motion.  Cardiovascular:     Rate and Rhythm: Tachycardia present.  Pulmonary:     Effort: Pulmonary effort is normal. No respiratory distress.     Breath sounds: Normal breath sounds.  Abdominal:     General: There is no distension.     Palpations: Abdomen is soft.     Tenderness: There is no abdominal tenderness.  Skin:    General: Skin is warm and dry.  Neurological:     Mental Status: She is alert  and oriented to person, place, and time.  Psychiatric:        Behavior: Behavior normal. Behavior is cooperative.      ED Treatments / Results  Labs (all labs ordered are listed, but only abnormal results are displayed) Labs Reviewed  SARS CORONAVIRUS 2 (HOSPITAL ORDER, PERFORMED IN [facility]) - Abnormal; Notable for the following components:      Result Value   SARS Coronavirus 2 POSITIVE (*)    All other components within normal limits  CBC WITH DIFFERENTIAL/PLATELET - Abnormal; Notable for the following components:   Lymphs Abs 0.5 (*)    All other components within normal limits  COMPREHENSIVE METABOLIC PANEL - Abnormal; Notable for the following components:   CO2 20 (*)    Glucose, Bld 119 (*)    All other components within normal limits  URINALYSIS, ROUTINE W REFLEX MICROSCOPIC  I-STAT BETA HCG BLOOD, ED ([facility], [facility], [facility] ONLY)    EKG None  Radiology Dg Chest Port 1 View  Result Date: 09/24/2018 CLINICAL DATA:  Cough and fever EXAM: PORTABLE CHEST 1 VIEW COMPARISON:  None. FINDINGS: Cardiac shadow is within normal limits. The lungs are clear. No bony abnormality is noted. IMPRESSION: No active disease. Electronically Signed   By: Alcide CleverMark  Lukens M.D.   On: 09/24/2018 21:00    Procedures Procedures (including critical care time)  Medications Ordered in ED Medications  sodium chloride 0.9 % bolus 1,000 mL (has no administration in time range)  acetaminophen (TYLENOL) tablet 650 mg (650 mg Oral Given 09/24/18 2111)  sodium chloride 0.9 % bolus 1,000 mL (0 mLs Intravenous Stopped 09/24/18 2253)  ketorolac (TORADOL) 15 MG/ML injection 15 mg (15 mg Intravenous Given 09/24/18 2130)     Initial Impression / Assessment and Plan / ED Course  I have reviewed the triage vital signs and the nursing notes.  Pertinent labs & imaging results that were available during my care of the patient were reviewed by me and considered in my medical decision making (see chart for  details).  28 year old female presents with fever, chills, cough.  Her temp is 100.2 and heart rate is significantly elevated at 130.  Blood pressure and respiratory rate is mildly elevated as well.  She is not hypoxic.  Exam is overall unremarkable with viral illness.  Suspect coronavirus.  Will order chest x-ray, labs, COVID test.  Will order fluids, Tylenol and Toradol for body aches.  COVID is positive.  Labs are unremarkable. She is still tachycardic after fluids and antipyretics. Will admit. Discussed with Dr. Loney Lohathore with [facility] who will admit.  Final Clinical Impressions(s) / ED Diagnoses   Final diagnoses:  COVID-19  Tachycardia    ED Discharge Orders    None       Bethel BornGekas, Adler Alton Marie, PA-C 09/24/18 Ouida [id number]    Alvira MondaySchlossman, Erin, MD 09/26/18 [phone]

## 2018-09-24 NOTE — ED Triage Notes (Signed)
Onset 2 days ago.  Fever, cough, chills.  Friend + Covid.  fever

## 2018-09-24 NOTE — ED Notes (Signed)
ED TO INPATIENT HANDOFF REPORT  ED Nurse Name and Phone #:  Sheela Stackesbeyde osorio RN [phone]  S Name/Age/Gender Karen Barajas 28 y.o. female Room/Bed: 017C/017C  Code Status   Code Status: Prior  Home/SNF/Other Home Patient oriented to: self, place, time and situation Is this baseline? Yes   Triage Complete: Triage complete  Chief Complaint flu like symptoms  Triage Note Onset 2 days ago.  Fever, cough, chills.  Friend + Covid.  fever   Allergies Allergies  Allergen Reactions  . Flexeril [Cyclobenzaprine] Itching    Itchy dry throat, chest tightness    Level of Care/Admitting Diagnosis ED Disposition    ED Disposition Condition Comment   Ad[facility]  [facility] [[id number]]  Level of Care: Telemetry [5]  Covid Evaluation: Confirmed COVID Positive  Diagnosis: COVID-19 virus infection [[id number]][[phone]]  Admitting Physician: John GiovanniATHORE, VASUNDHRA [[id number]]  Attending Physician: John GiovanniATHORE, VASUNDHRA [[id number]]  PT Class (Do Not Modify): Observation [104]  PT Acc Code (Do Not Modify): Observation [10022]       B Medical/Surgery History Past Medical History:  Diagnosis Date  . UTI (lower urinary tract infection)    Past Surgical History:  Procedure Laterality Date  . NO PAST SURGERIES       A IV Location/Drains/Wounds Patient Lines/Drains/Airways Status   Active Line/Drains/Airways    Name:   Placement date:   Placement time:   Site:   Days:   Peripheral IV 09/24/18 Left;Lateral Antecubital   09/24/18    2129    Antecubital   less than 1          Intake/Output Last 24 hours  Intake/Output Summary (Last 24 hours) at 09/24/2018 2347 Last data filed at 09/24/2018 2253 Gross per 24 hour  Intake 1000 ml  Output -  Net 1000 ml    Labs/Imaging Results for orders placed or performed during the hospital encounter of 09/24/18 (from the past 48 hour(s))  SARS Coronavirus 2 ([facility] Hosp Order      Status: Abnormal   Collection Time: 09/24/18  9:10 PM   Specimen: Nasopharyngeal Swab  Result Value Ref Range   SARS Coronavirus 2 POSITIVE (A) NEGATIVE    Comment: RESULT CALLED TO, READ BACK BY AND VERIFIED WITH: B OSORIO RN 09/24/18 2236 JDW (NOTE) If result is NEGATIVE SARS-CoV-2 target nucleic acids are NOT DETECTED. The SARS-CoV-2 RNA is generally detectable in upper and lower  respiratory specimens during the acute phase of infection. The lowest  concentration of SARS-CoV-2 viral copies this assay can detect is 250  copies / mL. A negative result does not preclude SARS-CoV-2 infection  and should not be used as the sole basis for treatment or other  patient management decisions.  A negative result may occur with  improper specimen collection / handling, submission of specimen other  than nasopharyngeal swab, presence of viral mutation(s) within the  areas targeted by this assay, and inadequate number of viral copies  (<250 copies / mL). A negative result must be combined with clinical  observations, patient history, and epidemiological information. If result is POSITIVE SARS-CoV-2 target nucleic acids are DETECTED. The SARS- CoV-2 RNA is generally detectable in upper and lower  respiratory specimens during the acute phase of infection.  Positive  results are indicative of active infection with SARS-CoV-2.  Clinical  correlation with patient history and other diagnostic information is  necessary to determine patient infection status.  Positive results do  not rule out  bacterial infection or co-infection with other viruses. If result is PRESUMPTIVE POSTIVE SARS-CoV-2 nucleic acids MAY BE PRESENT.   A presumptive positive result was obtained on the submitted specimen  and confirmed on repeat testing.  While 2019 novel coronavirus  (SARS-CoV-2) nucleic acids may be present in the submitted sample  additional confirmatory testing may be necessary for epidemiological  and / or  clinical management purposes  to differentiate between  SARS-CoV-2 and other Sarbecovirus currently known to infect humans.  If clinically indicated additional testing with an alternate test  methodology [phone]([id number]) is advised . The SARS-CoV-2 RNA is generally  detectable in upper and lower respiratory specimens during the acute  phase of infection. The expected result is Negative. Fact Sheet for Patients:  BoilerBrush.com.cyhttps://www.fda.gov/media/136312/download Fact Sheet for Healthcare Providers: https://pope.com/https://www.fda.gov/media/136313/download This test is not yet approved or cleared by the [place] FDA and has been authorized for detection and/or diagnosis of SARS-CoV-2 by FDA under an Emergency Use Authorization (EUA).  This EUA will remain in effect (meaning this test can be used) for the duration of the COVID-19 declaration under Section 564(b)(1) of the Act, 21 U.S.C. section 360bbb-3(b)(1), unless the authorization is terminated or revoked sooner. Performed at [facility], [address] [place], [place] [address]   CBC with Differential     Status: Abnormal   Collection Time: 09/24/18  9:22 PM  Result Value Ref Range   WBC 6.8 4.0 - 10.5 K/uL   RBC 4.97 3.87 - 5.11 MIL/uL   Hemoglobin 13.4 12.0 - 15.0 g/dL   HCT 81.142.1 91.436.0 - 78.246.0 %   MCV 84.7 80.0 - 100.0 fL   MCH 27.0 26.0 - 34.0 pg   MCHC 31.8 30.0 - 36.0 g/dL   RDW 95.614.2 21.311.5 - 08.615.5 %   Platelets 208 150 - 400 K/uL   nRBC 0.0 0.0 - 0.2 %   Neutrophils Relative % 82 %   Neutro Abs 5.6 1.7 - 7.7 K/uL   Lymphocytes Relative 8 %   Lymphs Abs 0.5 (L) 0.7 - 4.0 K/uL   Monocytes Relative 9 %   Monocytes Absolute 0.6 0.1 - 1.0 K/uL   Eosinophils Relative 1 %   Eosinophils Absolute 0.1 0.0 - 0.5 K/uL   Basophils Relative 0 %   Basophils Absolute 0.0 0.0 - 0.1 K/uL   Immature Granulocytes 0 %   Abs Immature Granulocytes 0.03 0.00 - 0.07 K/uL    Comment: Performed at [facility], [address] [place], [place] [address]   Comprehensive metabolic panel     Status: Abnormal   Collection Time: 09/24/18  9:22 PM  Result Value Ref Range   Sodium 135 135 - 145 mmol/L   Potassium 4.2 3.5 - 5.1 mmol/L   Chloride 104 98 - 111 mmol/L   CO2 20 (L) 22 - 32 mmol/L   Glucose, Bld 119 (H) 70 - 99 mg/dL   BUN 11 6 - 20 mg/dL   Creatinine, Ser 9.620.61 0.44 - 1.00 mg/dL   Calcium 9.0 8.9 - 95.210.3 mg/dL   Total Protein 7.2 6.5 - 8.1 g/dL   Albumin 3.9 3.5 - 5.0 g/dL   AST 26 15 - 41 U/L   ALT 27 0 - 44 U/L   Alkaline Phosphatase 75 38 - 126 U/L   Total Bilirubin 0.4 0.3 - 1.2 mg/dL   GFR calc non Af Amer >60 >60 mL/min   GFR calc Af Amer >60 >60 mL/min   Anion gap 11 5 -  15    Comment: Performed at [facility], [place] [address], [place], [place] [address]   Dg Chest Port 1 View  Result Date: 09/24/2018 CLINICAL DATA:  Cough and fever EXAM: PORTABLE CHEST 1 VIEW COMPARISON:  None. FINDINGS: Cardiac shadow is within normal limits. The lungs are clear. No bony abnormality is noted. IMPRESSION: No active disease. Electronically Signed   By: Inez Catalina M.D.   On: 09/24/2018 21:00    Pending Labs Unresulted Labs (From admission, onward)    Start     Ordered   09/24/18 2042  Urinalysis, Routine w reflex microscopic  ONCE - STAT,   STAT     09/24/18 2041          Vitals/Pain Today's Vitals   09/24/18 2230 09/24/18 2254 09/24/18 2315 09/24/18 2345  BP: 128/61  110/68 (!) 107/56  Pulse: (!) 127  (!) 116 (!) 122  Resp: 20     Temp:  98.3 F (36.8 C)    TempSrc:  Oral    SpO2: 94%  93% 95%  Weight:      Height:      PainSc:        Isolation Precautions Airborne and Contact precautions  Medications Medications  sodium chloride 0.9 % bolus 1,000 mL (has no administration in time range)  acetaminophen (TYLENOL) tablet 650 mg (650 mg Oral Given 09/24/18 2111)  sodium chloride 0.9 % bolus 1,000 mL (0 mLs Intravenous Stopped 09/24/18 2253)  ketorolac (TORADOL) 15 MG/ML injection 15 mg (15 mg Intravenous Given  09/24/18 2130)    Mobility walks with person assist Low fall risk   Focused Assessments pul   R Recommendations: See Admitting Provider Note  Report given to:   Additional Notes:

## 2018-09-25 ENCOUNTER — Observation Stay (HOSPITAL_COMMUNITY): Payer: Medicaid Other

## 2018-09-25 DIAGNOSIS — Z114 Encounter for screening for human immunodeficiency virus [HIV]: Secondary | ICD-10-CM

## 2018-09-25 DIAGNOSIS — Z791 Long term (current) use of non-steroidal anti-inflammatories (NSAID): Secondary | ICD-10-CM | POA: Diagnosis not present

## 2018-09-25 DIAGNOSIS — Z79899 Other long term (current) drug therapy: Secondary | ICD-10-CM | POA: Diagnosis not present

## 2018-09-25 DIAGNOSIS — Z6841 Body Mass Index (BMI) 40.0 and over, adult: Secondary | ICD-10-CM | POA: Diagnosis not present

## 2018-09-25 DIAGNOSIS — R509 Fever, unspecified: Secondary | ICD-10-CM | POA: Diagnosis present

## 2018-09-25 DIAGNOSIS — E860 Dehydration: Secondary | ICD-10-CM | POA: Diagnosis not present

## 2018-09-25 DIAGNOSIS — Z833 Family history of diabetes mellitus: Secondary | ICD-10-CM | POA: Diagnosis not present

## 2018-09-25 DIAGNOSIS — Z20822 Contact with and (suspected) exposure to covid-19: Secondary | ICD-10-CM | POA: Diagnosis present

## 2018-09-25 DIAGNOSIS — D72810 Lymphocytopenia: Secondary | ICD-10-CM | POA: Diagnosis not present

## 2018-09-25 DIAGNOSIS — J1289 Other viral pneumonia: Secondary | ICD-10-CM | POA: Diagnosis not present

## 2018-09-25 DIAGNOSIS — U071 COVID-19: Secondary | ICD-10-CM | POA: Diagnosis present

## 2018-09-25 DIAGNOSIS — Z888 Allergy status to other drugs, medicaments and biological substances status: Secondary | ICD-10-CM | POA: Diagnosis not present

## 2018-09-25 DIAGNOSIS — R000 Tachycardia, unspecified: Secondary | ICD-10-CM | POA: Diagnosis present

## 2018-09-25 DIAGNOSIS — Z8349 Family history of other endocrine, nutritional and metabolic diseases: Secondary | ICD-10-CM | POA: Diagnosis not present

## 2018-09-25 DIAGNOSIS — F419 Anxiety disorder, unspecified: Secondary | ICD-10-CM | POA: Diagnosis not present

## 2018-09-25 LAB — URINALYSIS, ROUTINE W REFLEX MICROSCOPIC
Bilirubin Urine: NEGATIVE
Glucose, UA: NEGATIVE mg/dL
Hgb urine dipstick: NEGATIVE
Ketones, ur: NEGATIVE mg/dL
Leukocytes,Ua: NEGATIVE
Nitrite: POSITIVE — AB
Protein, ur: NEGATIVE mg/dL
Specific Gravity, Urine: 1.009 (ref 1.005–1.030)
pH: 6 (ref 5.0–8.0)

## 2018-09-25 LAB — C-REACTIVE PROTEIN: CRP: 3.6 mg/dL — ABNORMAL HIGH (ref <1.0)

## 2018-09-25 LAB — LACTATE DEHYDROGENASE: LDH: 151 U/L (ref 98–192)

## 2018-09-25 LAB — T4, FREE: Free T4: 0.57 ng/dL — ABNORMAL LOW (ref 0.61–1.12)

## 2018-09-25 LAB — TROPONIN I (HIGH SENSITIVITY): Troponin I (High Sensitivity): 2 ng/L (ref <18)

## 2018-09-25 LAB — FERRITIN: Ferritin: 26 ng/mL (ref 11–307)

## 2018-09-25 LAB — FIBRINOGEN: Fibrinogen: 423 mg/dL (ref 210–475)

## 2018-09-25 LAB — HIV ANTIBODY (ROUTINE TESTING W REFLEX): HIV Screen 4th Generation wRfx: NONREACTIVE

## 2018-09-25 LAB — PROCALCITONIN: Procalcitonin: 0.15 ng/mL

## 2018-09-25 LAB — D-DIMER, QUANTITATIVE: D-Dimer, Quant: 1.26 ug/mL-FEU — ABNORMAL HIGH (ref 0.00–0.50)

## 2018-09-25 LAB — TSH: TSH: 0.897 u[IU]/mL (ref 0.350–4.500)

## 2018-09-25 MED ORDER — ENOXAPARIN SODIUM 80 MG/0.8ML ~~LOC~~ SOLN
75.0000 mg | SUBCUTANEOUS | Status: DC
  Administered 2018-09-25 – 2018-09-26 (×2): 75 mg via SUBCUTANEOUS
  Filled 2018-09-25 (×2): qty 0.8

## 2018-09-25 MED ORDER — ENOXAPARIN SODIUM 60 MG/0.6ML ~~LOC~~ SOLN
60.0000 mg | SUBCUTANEOUS | Status: DC
  Filled 2018-09-25: qty 0.6

## 2018-09-25 MED ORDER — SODIUM CHLORIDE 0.9 % IV SOLN
INTRAVENOUS | Status: DC
  Administered 2018-09-25 – 2018-09-26 (×2): via INTRAVENOUS

## 2018-09-25 MED ORDER — SODIUM CHLORIDE 0.9 % IV BOLUS
500.0000 mL | Freq: Once | INTRAVENOUS | Status: AC
  Administered 2018-09-25: 500 mL via INTRAVENOUS

## 2018-09-25 MED ORDER — SODIUM CHLORIDE 0.9 % IV SOLN: INTRAVENOUS | Status: DC

## 2018-09-25 MED ORDER — ACETAMINOPHEN 325 MG PO TABS
650.0000 mg | ORAL_TABLET | Freq: Four times a day (QID) | ORAL | Status: DC | PRN
  Administered 2018-09-25 (×3): 650 mg via ORAL
  Filled 2018-09-25 (×3): qty 2

## 2018-09-25 MED ORDER — IOHEXOL 350 MG/ML SOLN
100.0000 mL | Freq: Once | INTRAVENOUS | Status: AC | PRN
  Administered 2018-09-25: 100 mL via INTRAVENOUS

## 2018-09-25 MED ORDER — METHYLPREDNISOLONE SODIUM SUCC 125 MG IJ SOLR
60.0000 mg | Freq: Two times a day (BID) | INTRAMUSCULAR | Status: DC
  Administered 2018-09-25 – 2018-09-26 (×4): 60 mg via INTRAVENOUS
  Filled 2018-09-25 (×4): qty 2

## 2018-09-25 MED ORDER — GUAIFENESIN-DM 100-10 MG/5ML PO SYRP: 10.0000 mL | ORAL_SOLUTION | ORAL | Status: DC | PRN

## 2018-09-25 NOTE — ED Notes (Signed)
Transfer consent obtained.

## 2018-09-25 NOTE — ED Notes (Signed)
Attempted to call report to [facility] [phone] with no answer. Nurse will call back 5-10 minutes

## 2018-09-25 NOTE — Progress Notes (Signed)
[place] TEAM 1 - Stepdown/ICU TEAM  Karen ChromanSelene Casillas Barajas  MWN:[id number]:[id number] DOB: 01/12/1991 DOA: 09/24/2018 PCP: Patient, No Pcp Per    Brief Narrative:  28 y.o. w/ a hx of morbid obesity  who presented to the hospital with 24 hours of body aches fever and cough with associated pleuritic type chest pain.  She reported an exposure to an individual with COVID-19.    In the ED she was found to have a temperature of 100.2 F and was tachycardic with heart rate up to 140. COVID-19 test was positive but she was not significantly hypoxic though labs did reveal a lymphopenia.  Though a chest x-ray was unrevealing a CTangio of the chest suggested probable B LL pneumonia versus pneumonitis.  Significant Events: 7/1 Admit to [facility] via [facility] ED  COVID-19 specific Treatment: Steroids 7/2 >  Subjective: Patient reports that she is feeling better overall.  She denies chest pain or shortness of breath when at rest.  She denies nausea vomiting or abdominal pain.  Assessment & Plan:  COVID-19 pneumonia Consider continue steroid only for now -lymphopenia and tachycardia are concerning -gently hydrate and monitor -follow-up chest x-ray in a.m. -procalcitonin not convincing for acute bacterial infection  Sinus tachycardia TSH normal -no PE on CTa chest -possibly due to simple dehydration -continue to gently hydrate  Morbid Obesity - Body mass index is 64.39 kg/m.   DVT prophylaxis: lovenox  Code Status: FULL CODE Family Communication:  Disposition Plan:   Consultants:  none  Antimicrobials:  None   Objective: Blood pressure 134/81, pulse (!) 103, temperature 98 F (36.7 C), temperature source Oral, resp. rate (!) 24, height 5' 1.42" (1.56 m), weight (!) 156.7 kg, last menstrual period 03/26/2018, SpO2 95 %.  Intake/Output Summary (Last 24 hours) at 09/25/2018 0954 Last data filed at 09/25/2018 0600 Gross per 24 hour  Intake 3500 ml  Output -  Net 3500 ml   Filed Weights   09/24/18 2042  09/25/18 0400  Weight: 120 kg (!) 156.7 kg    Examination: General: No acute respiratory distress Lungs: Distant breath sounds due to body habitus with mild right basilar crackles and no wheezing Cardiovascular: Mildly tachycardic but regular with no murmur or rub Abdomen: Nontender, obese, soft, bowel sounds positive, no rebound, no ascites, no appreciable mass Extremities: No significant cyanosis, clubbing, or edema bilateral lower extremities  CBC: Recent Labs  Lab 09/24/18 2122  WBC 6.8  NEUTROABS 5.6  HGB 13.4  HCT 42.1  MCV 84.7  PLT 208   Basic Metabolic Panel: Recent Labs  Lab 09/24/18 2122  NA 135  K 4.2  CL 104  CO2 20*  GLUCOSE 119*  BUN 11  CREATININE 0.61  CALCIUM 9.0   GFR: Estimated Creatinine Clearance: 153.4 mL/min (by C-G formula based on SCr of 0.61 mg/dL).  Liver Function Tests: Recent Labs  Lab 09/24/18 2122  AST 26  ALT 27  ALKPHOS 75  BILITOT 0.4  PROT 7.2  ALBUMIN 3.9     Recent Results (from the past 240 hour(s))  SARS Coronavirus 2 (CEPHEID- Performed in [facility] hospital lab), Hosp Order     Status: Abnormal   Collection Time: 09/24/18  9:10 PM   Specimen: Nasopharyngeal Swab  Result Value Ref Range Status   SARS Coronavirus 2 POSITIVE (A) NEGATIVE Final    Comment: RESULT CALLED TO, READ BACK BY AND VERIFIED WITH: B OSORIO RN 09/24/18 2236 JDW (NOTE) If result is NEGATIVE SARS-CoV-2 target nucleic acids are NOT DETECTED.  The SARS-CoV-2 RNA is generally detectable in upper and lower  respiratory specimens during the acute phase of infection. The lowest  concentration of SARS-CoV-2 viral copies this assay can detect is 250  copies / mL. A negative result does not preclude SARS-CoV-2 infection  and should not be used as the sole basis for treatment or other  patient management decisions.  A negative result may occur with  improper specimen collection / handling, submission of specimen other  than nasopharyngeal swab,  presence of viral mutation(s) within the  areas targeted by this assay, and inadequate number of viral copies  (<250 copies / mL). A negative result must be combined with clinical  observations, patient history, and epidemiological information. If result is POSITIVE SARS-CoV-2 target nucleic acids are DETECTED. The SARS- CoV-2 RNA is generally detectable in upper and lower  respiratory specimens during the acute phase of infection.  Positive  results are indicative of active infection with SARS-CoV-2.  Clinical  correlation with patient history and other diagnostic information is  necessary to determine patient infection status.  Positive results do  not rule out bacterial infection or co-infection with other viruses. If result is PRESUMPTIVE POSTIVE SARS-CoV-2 nucleic acids MAY BE PRESENT.   A presumptive positive result was obtained on the submitted specimen  and confirmed on repeat testing.  While 2019 novel coronavirus  (SARS-CoV-2) nucleic acids may be present in the submitted sample  additional confirmatory testing may be necessary for epidemiological  and / or clinical management purposes  to differentiate between  SARS-CoV-2 and other Sarbecovirus currently known to infect humans.  If clinically indicated additional testing with an alternate test  methodology [phone]) is advised . The SARS-CoV-2 RNA is generally  detectable in upper and lower respiratory specimens during the acute  phase of infection. The expected result is Negative. Fact Sheet for Patients:  StrictlyIdeas.no Fact Sheet for Healthcare Providers: BankingDealers.co.za This test is not yet approved or cleared by the [place] FDA and has been authorized for detection and/or diagnosis of SARS-CoV-2 by FDA under an Emergency Use Authorization (EUA).  This EUA will remain in effect (meaning this test can be used) for the duration of the COVID-19 declaration under  Section 564(b)(1) of the Act, 21 U.S.C. section 360bbb-3(b)(1), unless the authorization is terminated or revoked sooner. Performed at [facility], [place] [address] [place], [place] [address]      Scheduled Meds: . enoxaparin (LOVENOX) injection  75 mg Subcutaneous Q24H  . methylPREDNISolone (SOLU-MEDROL) injection  60 mg Intravenous BID     LOS: 0 days   Cherene Altes, MD [facility] Office  [phone] Pager - Text Page per Shea Evans  If 7PM-7AM, please contact night-coverage per Amion 09/25/2018, 9:54 AM

## 2018-09-25 NOTE — ED Notes (Signed)
Nurse to call Rolanda Lundborg at [phone] when carelink arrive to ED for pt. Transfer.

## 2018-09-25 NOTE — Progress Notes (Addendum)
Tried to call to get report on patient but no one answered main ED phone line. Will try again soon.

## 2018-09-25 NOTE — Progress Notes (Signed)
Patient stated via wall-e interpreter that I didn't need to call her Mom with an update.

## 2018-09-25 NOTE — ED Notes (Signed)
Transported via carelink to [facility]

## 2018-09-25 NOTE — ED Notes (Signed)
Secretary to call carelink

## 2018-09-26 ENCOUNTER — Inpatient Hospital Stay (HOSPITAL_COMMUNITY): Payer: Medicaid Other

## 2018-09-26 DIAGNOSIS — U071 COVID-19: Principal | ICD-10-CM

## 2018-09-26 LAB — CBC WITH DIFFERENTIAL/PLATELET
Abs Immature Granulocytes: 0.04 10*3/uL (ref 0.00–0.07)
Basophils Absolute: 0 10*3/uL (ref 0.0–0.1)
Basophils Relative: 0 %
Eosinophils Absolute: 0 10*3/uL (ref 0.0–0.5)
Eosinophils Relative: 0 %
HCT: 41.5 % (ref 36.0–46.0)
Hemoglobin: 12.8 g/dL (ref 12.0–15.0)
Immature Granulocytes: 1 %
Lymphocytes Relative: 11 %
Lymphs Abs: 0.9 10*3/uL (ref 0.7–4.0)
MCH: 26.7 pg (ref 26.0–34.0)
MCHC: 30.8 g/dL (ref 30.0–36.0)
MCV: 86.5 fL (ref 80.0–100.0)
Monocytes Absolute: 0.5 10*3/uL (ref 0.1–1.0)
Monocytes Relative: 6 %
Neutro Abs: 6.6 10*3/uL (ref 1.7–7.7)
Neutrophils Relative %: 82 %
Platelets: 205 10*3/uL (ref 150–400)
RBC: 4.8 MIL/uL (ref 3.87–5.11)
RDW: 14.6 % (ref 11.5–15.5)
WBC: 8 10*3/uL (ref 4.0–10.5)
nRBC: 0 % (ref 0.0–0.2)

## 2018-09-26 LAB — D-DIMER, QUANTITATIVE: D-Dimer, Quant: 1.41 ug/mL-FEU — ABNORMAL HIGH (ref 0.00–0.50)

## 2018-09-26 LAB — COMPREHENSIVE METABOLIC PANEL
ALT: 28 U/L (ref 0–44)
AST: 24 U/L (ref 15–41)
Albumin: 4 g/dL (ref 3.5–5.0)
Alkaline Phosphatase: 73 U/L (ref 38–126)
Anion gap: 10 (ref 5–15)
BUN: 14 mg/dL (ref 6–20)
CO2: 22 mmol/L (ref 22–32)
Calcium: 8.8 mg/dL — ABNORMAL LOW (ref 8.9–10.3)
Chloride: 107 mmol/L (ref 98–111)
Creatinine, Ser: 0.41 mg/dL — ABNORMAL LOW (ref 0.44–1.00)
GFR calc Af Amer: >60 mL/min (ref >60)
GFR calc non Af Amer: >60 mL/min (ref >60)
Glucose, Bld: 151 mg/dL — ABNORMAL HIGH (ref 70–99)
Potassium: 4.5 mmol/L (ref 3.5–5.1)
Sodium: 139 mmol/L (ref 135–145)
Total Bilirubin: 0.2 mg/dL — ABNORMAL LOW (ref 0.3–1.2)
Total Protein: 7.6 g/dL (ref 6.5–8.1)

## 2018-09-26 LAB — FERRITIN: Ferritin: 54 ng/mL (ref 11–307)

## 2018-09-26 MED ORDER — GUAIFENESIN-DM 100-10 MG/5ML PO SYRP: 10.0000 mL | ORAL_SOLUTION | ORAL | 0 refills | Status: AC | PRN

## 2018-09-26 MED ORDER — PREDNISONE 20 MG PO TABS: 60.0000 mg | ORAL_TABLET | Freq: Two times a day (BID) | ORAL | Status: DC

## 2018-09-26 MED ORDER — PREDNISONE 20 MG PO TABS: ORAL_TABLET | ORAL | 0 refills | Status: DC

## 2018-09-26 NOTE — Discharge Summary (Signed)
DISCHARGE SUMMARY  Karen Barajas  MR#: [id number]  DOB:[birth year]  Date of Admission: 09/24/2018 Date of Discharge: 09/26/2018  Attending Physician:Jeffrey Hennie Duos, MD  Patient's TGG:YIRSWNI, No Pcp Per  Consults: none  Disposition: D/C home   Follow-up Appts: The case manager is making arrangements for the patient to have a tele-visit follow-up on 09/29/2018.  Tests Needing Follow-up: -assess for hypoxia at rest and w/ exertion -assess for tachycardia   COVID-19 specific Treatment: Steroids   Discharge Diagnoses: B LL COVID-19 Pneumonia Sinus tachycardia Morbid Obesity - Body mass index is 64.39 kg/m   Initial presentation: 28 y.o.w/ a hx ofmorbidobesitywho presented to the hospital with 24 hours of body aches fever and cough with associated pleuritic type chest pain.  She reported an exposure to an individual with COVID-19.   In the ED she was found to have a temperature of 100.2 F and was tachycardic with heart rate up to 140. COVID-19 test was positive but she was not significantly hypoxic though labs did reveal a lymphopenia. Though a chest x-ray was unrevealing a CTangio of the chest suggested probable B LL pneumonia versus pneumonitis.  Hospital Course:  COVID-19 pneumonia Continue steroid for a 10 day course - procalcitonin not convincing for acute bacterial infection - f/u CXR w/o new focal infiltrate - CTa chest noted B LL infiltrates - clinically much improved - not hypoxic on RA - stable for d/c home   Sinus tachycardia TSH normal - no PE on CTa chest - likely due to simple dehydration +/- component of anxiety - essentially resolved w/ volume expansion   Morbid Obesity - Body mass index is 64.39 kg/m.   Allergies as of 09/26/2018      Reactions   Flexeril [cyclobenzaprine] Itching   Itchy dry throat, chest tightness      Medication List    TAKE these medications   acetaminophen 325 MG tablet Commonly known as: TYLENOL Take 325 mg  by mouth every 6 (six) hours as needed for mild pain, moderate pain, fever or headache.   guaiFENesin-dextromethorphan 100-10 MG/5ML syrup Commonly known as: ROBITUSSIN DM Take 10 mLs by mouth every 4 (four) hours as needed for cough.   predniSONE 20 MG tablet Commonly known as: DELTASONE Take 3 tablets (60 mg total) by mouth 2 (two) times daily with a meal for 2 days, THEN 2 tablets (40 mg total) 2 (two) times daily with a meal for 2 days, THEN 1 tablet (20 mg total) 2 (two) times daily with a meal for 2 days, THEN 0.5 tablets (10 mg total) 2 (two) times daily with a meal for 2 days, THEN 0.5 tablets (10 mg total) daily with breakfast for 2 days. Start taking on: September 26, 2018       Day of Discharge BP 123/68 (BP Location: Right Arm)   Pulse 98   Temp 98.7 F (37.1 C) (Oral)   Resp 18   Ht 5' 1.42" (1.56 m)   Wt (!) 156.7 kg   LMP 03/26/2018   SpO2 97%   BMI 64.39 kg/m   Physical Exam: General: No acute respiratory distress Lungs: mild bibasilar crackles with no wheezing and good air movement throughout Cardiovascular: Regular rate and rhythm without murmur gallop or rub normal S1 and S2 Abdomen: Nontender, nondistended, soft, bowel sounds positive, no rebound, no ascites, no appreciable mass Extremities: No significant cyanosis, clubbing, or edema bilateral lower extremities  Basic Metabolic Panel: Recent Labs  Lab 09/24/18 2122 09/26/18 0415  NA 135 139  K 4.2 4.5  CL 104 107  CO2 20* 22  GLUCOSE 119* 151*  BUN 11 14  CREATININE 0.61 0.41*  CALCIUM 9.0 8.8*    Liver Function Tests: Recent Labs  Lab 09/24/18 2122 09/26/18 0415  AST 26 24  ALT 27 28  ALKPHOS 75 73  BILITOT 0.4 0.2*  PROT 7.2 7.6  ALBUMIN 3.9 4.0    CBC: Recent Labs  Lab 09/24/18 2122 09/26/18 0415  WBC 6.8 8.0  NEUTROABS 5.6 6.6  HGB 13.4 12.8  HCT 42.1 41.5  MCV 84.7 86.5  PLT 208 205     Recent Results (from the past 240 hour(s))  SARS Coronavirus 2 (CEPHEID- Performed in  [facility] hospital lab), Hosp Order     Status: Abnormal   Collection Time: 09/24/18  9:10 PM   Specimen: Nasopharyngeal Swab  Result Value Ref Range Status   SARS Coronavirus 2 POSITIVE (A) NEGATIVE Final    Comment: RESULT CALLED TO, READ BACK BY AND VERIFIED WITH: B OSORIO RN 09/24/18 2236 JDW (NOTE) If result is NEGATIVE SARS-CoV-2 target nucleic acids are NOT DETECTED. The SARS-CoV-2 RNA is generally detectable in upper and lower  respiratory specimens during the acute phase of infection. The lowest  concentration of SARS-CoV-2 viral copies this assay can detect is 250  copies / mL. A negative result does not preclude SARS-CoV-2 infection  and should not be used as the sole basis for treatment or other  patient management decisions.  A negative result may occur with  improper specimen collection / handling, submission of specimen other  than nasopharyngeal swab, presence of viral mutation(s) within the  areas targeted by this assay, and inadequate number of viral copies  (<250 copies / mL). A negative result must be combined with clinical  observations, patient history, and epidemiological information. If result is POSITIVE SARS-CoV-2 target nucleic acids are DETECTED. The SARS- CoV-2 RNA is generally detectable in upper and lower  respiratory specimens during the acute phase of infection.  Positive  results are indicative of active infection with SARS-CoV-2.  Clinical  correlation with patient history and other diagnostic information is  necessary to determine patient infection status.  Positive results do  not rule out bacterial infection or co-infection with other viruses. If result is PRESUMPTIVE POSTIVE SARS-CoV-2 nucleic acids MAY BE PRESENT.   A presumptive positive result was obtained on the submitted specimen  and confirmed on repeat testing.  While 2019 novel coronavirus  (SARS-CoV-2) nucleic acids may be present in the submitted sample  additional confirmatory  testing may be necessary for epidemiological  and / or clinical management purposes  to differentiate between  SARS-CoV-2 and other Sarbecovirus currently known to infect humans.  If clinically indicated additional testing with an alternate test  methodology [phone]([id number]) is advised . The SARS-CoV-2 RNA is generally  detectable in upper and lower respiratory specimens during the acute  phase of infection. The expected result is Negative. Fact Sheet for Patients:  BoilerBrush.com.cyhttps://www.fda.gov/media/136312/download Fact Sheet for Healthcare Providers: https://pope.com/https://www.fda.gov/media/136313/download This test is not yet approved or cleared by the [place] FDA and has been authorized for detection and/or diagnosis of SARS-CoV-2 by FDA under an Emergency Use Authorization (EUA).  This EUA will remain in effect (meaning this test can be used) for the duration of the COVID-19 declaration under Section 564(b)(1) of the Act, 21 U.S.C. section 360bbb-3(b)(1), unless the authorization is terminated or revoked sooner. Performed at [facility], [address] [place], [place] [address]  Time spent in discharge (includes decision making & examination of pt): 30 minutes  09/26/2018, 3:24 PM   Lonia BloodJeffrey T. McClung, MD [facility] Office  [phone]

## 2018-09-26 NOTE — Discharge Instructions (Signed)
YOU MAY NOT RETURN TO WORK OR GO OUT IN PUBLIC UNTIL:  You have had no fever for at least 72 hours (that is three full days of no fever without the use of medicine that reduces fevers) AND other symptoms have improved (for example, when your cough or shortness of breath or diarrhea have improved) AND at least 10 days have passed since your symptoms first appeared  NO PUEDE REGRESAR AL TRABAJO O SALIR EN PBLICO HASTA:  No ha tenido [employer] al menos 72 horas (es Software engineer, tres das completos sin fiebre sin el uso de medicamentos que reducen la [place]) Y otros sntomas han mejorado (por ejemplo, cuando la tos o la falta de Teacher, adult education o la diarrea han mejorado) Y Han transcurrido al menos 10 das desde que aparecieron sus sntomas por primera vez    COVID-19 COVID-64 La COVID-19 es una infeccin respiratoria causada por un virus llamado coronavirus tipo 2 causante del sndrome respiratorio agudo grave (SARS-CoV-2). La enfermedad tambin se conoce como enfermedad por coronavirus o nuevo coronavirus. En algunas personas, el virus puede no ocasionar sntomas. En otras, puede producir una infeccin grave. La infeccin puede empeorar rpidamente y causar complicaciones, como:  Neumona o infeccin en los pulmones.  Sndrome de dificultad respiratoria aguda o SDRA. Se trata de la acumulacin de lquido en los pulmones.  Insuficiencia respiratoria aguda. Se trata de una afeccin en la que no pasa suficiente oxgeno de los pulmones al cuerpo.  Sepsis o choque sptico. Se trata de una reaccin grave del cuerpo ante una infeccin.  Problemas de coagulacin.  Infecciones secundarias debido a bacterias u hongos. El virus que causa la COVID-19 es contagioso. Esto significa que puede transmitirse de [place] persona a otra a travs de las gotitas de saliva de la tos y de los estornudos (secreciones respiratorias). Cules son las causas? Esta enfermedad es causada por un virus. Usted puede contagiarse  con este virus:  Al aspirar las gotitas que una persona infectada elimina al toser o Brewing technologist.  Al tocar algo, como una mesa o el picaportes de [place], que estuvo expuesto al virus (contaminado) y luego tocarse la boca, nariz o los ojos. Qu incrementa el riesgo? Riesgo de infeccin Es ms probable que se infecte con este virus si:  Vive o viaja a una zona donde hay un brote de COVID-19.  Rodman Comp en contacto con una persona enferma que recientemente viaj a una zona con un brote de COVID-19.  Cuida o vive con una persona infectada con COVID-19. Riesgo de enfermedad grave Es ms probable que se enferme gravemente por el virus si:  Tiene 65aos o ms.  Tiene una enfermedad crnica que disminuye la capacidad del cuerpo para combatir las infecciones (immunocomprometido).  Vive en un hogar de ancianos o centro de atencin a Barrister's clerk.  Tiene una enfermedad prolongada (crnica), como las siguientes: ? Enfermedad pulmonar crnica, que incluye la enfermedad pulmonar obstructiva crnica o asma. ? Enfermedad cardaca. ? Diabetes. ? Enfermedad renal crnica. ? Enfermedad heptica.  Es obeso. Cules son los signos o sntomas? Los sntomas de esta afeccin pueden ser de leves a graves. Los sntomas pueden aparecer en el trmino de 2 a [address] despus de haber estado expuesto al virus. Incluyen los siguientes:  Los [place].  Tos.  Dificultad para respirar.  Escalofros.  Dolores musculares.  Dolor de Investment banker, operational.  Prdida del gusto o Armed forces logistics/support/administrative officer. Algunas personas tambin pueden Frontier Oil Corporation, como nuseas, vmitos o diarrea. Es posible que otras personas no tengan  sntomas de COVID-19. Cmo se diagnostica? Esta afeccin se puede diagnosticar en funcin de lo siguiente:  Sus signos y sntomas, especialmente si: ? Vive en una zona donde hay un brote de COVID-19. ? Viaj recientemente a una zona donde el virus es frecuente. ? Cuida o vive con Neomia Dearuna persona a quien se  le diagnostic COVID-19.  Un examen fsico.  Anlisis de laboratorio que pueden incluir: ? Un hisopado nasal para tomar Colombiauna muestra de lquido de la nariz. ? Un hisopado de garganta para tomar Lauris Poaguna muestra de lquido de la garganta. ? Una muestra de mucosidad de los pulmones (esputo). ? Anlisis de Ryesangre.  Los estudios de diagnstico por imgenes pueden incluir radiografas, exploracin por tomografa computarizada (TC) o ecografa. Cmo se trata? En este momento, no hay ningn medicamento para tratar la COVID-19. Los medicamentos para tratar otras enfermedades se usan a modo de ensayo para comprobar si son eficaces contra la COVID-19. El mdico le informar sobre las maneras de tratar los sntomas. En la Franklin Resourcesmayora de las personas, la infeccin es leve y puede controlarse en el hogar con reposo, lquidos y medicamentos de Callawayventa libre. El tratamiento para una infeccin grave suele realizarse en la unidad de cuidados intensivos (UCI) de un hospital. Puede incluir uno o ms de los siguientes. Estos tratamientos se administran hasta que los sntomas mejoran.  Recibir lquidos y United Parcelmedicamentos a travs de una va intravenosa.  Oxgeno complementario. Para administrar oxgeno extra, se Cocos (Keeling) Islandsutiliza un tubo en la Darene Lamernariz, una mascarilla o una campana de oxgeno.  Colocarlo para que se recueste boca abajo (decbito prono). Esto facilita el ingreso de oxgeno a los pulmones.  Uso continuo de Comorosuna mquina de presin positiva de las vas areas (CPAP) o de presin positiva de las vas areas de dos niveles (BPAP). Este tratamiento utiliza una presin de aire leve para Pharmacologistmantener las vas respiratorias abiertas. Un tubo conectado a un motor administra oxgeno al cuerpo.  Respirador. Este tratamiento mueve el aire dentro y fuera de los pulmones mediante el uso de un tubo que se coloca en la trquea.  Traqueostoma. En este procedimiento se hace un orificio en el cuello para insertar un tubo de  respiracin.  Oxigenacin por membrana extracorprea (OMEC). En este procedimiento, los pulmones tienen la posibilidad de recuperarse al asumir las funciones del corazn y los pulmones. Suministra oxgeno al cuerpo y elimina el dixido de carbono. Siga estas instrucciones en su casa: Estilo de vida  Si est enfermo, qudese en su casa, excepto para obtener atencin mdica. El mdico le indicar cunto tiempo debe quedarse en casa. Llame al mdico antes de buscar atencin mdica.  Haga reposo en su casa como se lo haya indicado el mdico.  No consuma ningn producto que contenga nicotina o tabaco, como cigarrillos, cigarrillos electrnicos y tabaco de Theatre managermascar. Si necesita ayuda para dejar de fumar, consulte al mdico.  Retome sus actividades normales como se lo haya indicado el mdico. Pregntele al mdico qu actividades son seguras para usted. Instrucciones generales  Use los medicamentos de venta libre y los recetados solamente como se lo haya indicado el mdico.  Beba suficiente lquido como para Pharmacologistmantener la orina de color amarillo plido.  Concurra a todas las visitas de [address]seguimiento como se lo haya indicado el mdico. Esto es importante. Cmo se evita?  No hay ninguna vacuna que ayude a prevenir la infeccin por la COVID-19. Sin embargo, hay medidas que puede tomar para protegerse y Conservator, museum/galleryproteger a Economistotras personas de este virus. Para protegerse:  No viaje a zonas donde la COVID-19 sea un riesgo. Las zonas donde se informa la presencia de la COVID-19 [place] con frecuencia. Para identificar las zonas de alto riesgo y las restricciones de viaje, consulte el sitio web de viajes de Building control surveyor for Micron Technology and Prevention Insurance claims handler) (Centros para el Control y la Prevencin de Event organiser): StageSync.si  Si vive o debe viajar a una zona donde COVID-19 es un riesgo, tome precauciones para evitar infecciones. ? Aljese de Engel[employer] Corporation. ? Lvese las manos frecuentemente  con agua y [place]. Use desinfectante para manos con alcohol si no dispone de [place] y [place]. ? Evite tocarse la boca, la cara, los ojos o la [place]. ? Evite salir de su casa, siga las indicaciones de su estado y de las autoridades sanitarias locales. ? Si debe salir de su casa, use un barbijo de tela o una mascarilla facial. ? Desinfecte los objetos y las superficies que se tocan con frecuencia todos [place]. Pueden incluir:  Encimeras y [place].  Picaportes e interruptores de luz.  Lavabos, fregaderos y grifos.  Aparatos electrnicos tales como telfonos, controles remotos, teclados, computadoras y tabletas. Cmo proteger a los dems: Si tiene sntomas de la COVID-19, tome medidas para evitar que el virus se propague a Economist.  Si cree que tiene una infeccin por la COVID-19, comunquese de inmediato con su mdico. Informe al equipo de atencin mdica que cree que puede tener una infeccin por la COVID-19.  Qudese en su casa. Salga de su casa solo para buscar atencin mdica. No utilice el transporte pblico.  No viaje mientras est enfermo.  Lvese las manos frecuentemente con agua y [place]. Usar desinfectante para manos con alcohol si no dispone de [place] y [place].  Mantngase alejado de quienes vivan con usted. Permita que los miembros de la familia sanos cuiden a los nios y las [place], si es posible. Si tiene que cuidar a los nios o las mascotas, lvese las manos con frecuencia y use un barbijo. Si es posible, permanezca en su habitacin, separado de los dems. Utilice un bao diferente.  Asegrese de que todas las personas que viven en su casa se laven bien las manos y con frecuencia.  Tosa o estornude en un pauelo de papel o sobre su manga o codo. No tosa o estornude al aire ni se cubra la boca o la nariz con la [place].  Use un barbijo de tela o una mascarilla facial. Dnde buscar ms informacin  Centers for Disease Control and  Prevention (Centros para el Control y la Prevencin de Event organiser): StickerEmporium.tn  World Health Organization (Organizacin Mundial de la Salud): [email or web address] Comunquese con un mdico si:  Vive o ha viajado a una zona donde la COVID-19 es un riesgo y tiene sntomas de infeccin.  Ha tenido contacto con alguien que tiene COVID-19 y usted tiene sntomas de infeccin. Solicite ayuda de inmediato si:  Tiene dificultad para respirar.  Siente dolor u opresin en el pecho.  Experimenta confusin.  Tiene las uas de los dedos y los labios de color [place].  Tiene dificultad para despertarse.  Los sntomas empeoran. Estos sntomas pueden representar un problema grave que constituye Radio broadcast assistant. No espere a ver si los sntomas desaparecen. Solicite atencin mdica de inmediato. Comunquese con el servicio de emergencias de su localidad (911 en los [place]). No conduzca por sus propios medios [employer] hospital. Informe al personal mdico de emergencias si cree que tiene COVID-19. Resumen  La COVID-19 es una infeccin respiratoria causada por un virus. Tambin se conoce como enfermedad por coronavirus o nuevo coronavirus. Puede causar infecciones graves, como neumona, sndrome de dificultad respiratoria aguda, insuficiencia respiratoria aguda o sepsis.  El virus que causa la COVID-19 es contagioso. Esto significa que puede transmitirse de Burk[place] Fasouna persona a otra a travs de las gotitas de saliva de la tos y de los estornudos.  Es ms probable que desarrolle una enfermedad grave si tiene 65 aos o ms, tiene un sistema inmunitario dbil, vive en un hogar de ancianos o tiene enfermedad crnica.  No hay ningn medicamento para tratar la COVID-19. El mdico le informar sobre las maneras de tratar los sntomas.  Tome medidas para protegerse y Conservator, museum/galleryproteger a los Merchandiser, retaildems contra las infecciones. Lvese las manos con frecuencia y desinfecte los  objetos y las superficies que se tocan con frecuencia todos Naturitalos das. Mantngase alejado de las personas que estn enfermas y use un barbijo si est enfermo. Esta informacin no tiene Theme park managercomo fin reemplazar el consejo del mdico. Asegrese de hacerle al mdico cualquier pregunta que tenga. Document Released: 05/10/2018 Document Revised: 08/12/2018 Document Reviewed: 05/10/2018 Elsevier Patient Education  2020 Elsevier Inc.   COVID-19: Cmo protegerse y proteger a los dems COVID-19: How to Protect Yourself and Others Sepa cmo se propaga  Actualmente, no existe ninguna vacuna para prevenir la enfermedad por coronavirus 2019 (COVID-19).  La mejor forma de prevenir la enfermedad es evitar exponerse a este virus.  Se cree que el virus se transmite principalmente de Burkina Fasouna persona a Liechtensteinotra. ? Air Products and ChemicalsEntre las personas que estn en contacto directo entre s (a una distancia inferior a 6 pies [1.5164m]). ? A travs de las gotitas respiratorias producidas cuando una persona infectada tose, estornuda o habla. ? Estas gotitas pueden caer en la boca o en la nariz de las personas que estn cerca o pueden ser Brink's Companyinhaladas hacia los pulmones. ? Algunos estudios recientes sugieren que la COVID-19 puede ser transmitida por personas que no presentan sntomas. Lo que todos deben hacer Public Service Enterprise GroupLmpiese las manos con frecuencia  Lvese las manos con frecuencia con agua y jabn durante al menos 20 segundos, especialmente despus de Oceanographerhaber estado en un lugar pblico o despus de sonarse la nariz, toser o estornudar.  Si no dispone de [place]agua y Belarusjabn, use un desinfectante de manos que contenga al menos un 60% de alcohol. Cubra todas las superficies de las manos y frtelas hasta que se sientan secas.  No se toque los ojos, la nariz y la boca sin antes lavarse las manos. Evite el contacto cercano  Qudese en casa si est enfermo.  Evite el contacto cercano con personas que estn enfermas.  Establezca distancia entre usted y Colgate-Palmoliveotras  personas. ? Recuerde que Micron Technologyalgunas personas que no tienen sntomas pueden transmitir el virus. ? Esto es Software engineerespecialmente importante para las personas que tienen ms riesgo de https://willis-[email or web address]/enfermarse.www.cdc.gov/coronavirus/2019-ncov/need-extra-precautions/people-at-higher-risk.html Cbrase la boca y la nariz con un barbijo de tela cuando est cerca de otras personas  Puede transmitir la COVID-19 a Economistotras personas aunque no se sienta enfermo.  Todas las personas deben usar un barbijo de tela cuando tengan que ir a un lugar pblico, por ejemplo, al supermercado o a buscar otros productos necesarios. ? Los barbijos de tela no deben colocarse a nios menores de 2 aos de Van Vleckedad, a las personas que tienen problemas respiratorios o que estn inconscientes, incapacitadas o que por algn motivo no puedan quitarse la mascarilla sin Bataviaayuda.  El propsito del barbijo de tela  es proteger a Higher education careers adviserotras personas en caso de que usted est infectado.  NO utilice las Gannett Comascarillas destinadas a los trabajadores de Beazer Homesla salud.  Contine manteniendo una distancia aproximada de 6 pies (1.4849m) entre usted y Nucor Corporationotras personas. El barbijo de tela no reemplaza el distanciamiento social. Susa LofflerCbrase al toser y estornudar  Si est en un ambiente privado y no tiene el barbijo de tela, recuerde siempre cubrirse la boca y la nariz con un pauelo descartable al toser o Engineering geologistestornudar, o usar el pliegue del codo.  Deseche los pauelos descartables usados en la basura.  Inmediatamente, lvese las manos con agua y [place]jabn durante al menos 20segundos. Si no dispone de agua y Belarusjabn, Land O'Lakeslmpiese las manos con un desinfectante de manos que contenga al menos un 60% de alcohol. Limpie y desinfecte  Limpie Y desinfecte las superficies que se tocan con frecuencia todos los [address]  [address]das. Esto incluye mesas, picaportes, interruptores de luz, encimeras, mangos, escritorios, telfonos, teclados, inodoros, grifos y lavabos.  ktimeonline.comwww.cdc.gov/coronavirus/2019-ncov/prevent-getting-sick/disinfecting-your-home.html  Si las superficies estn sucias, lmpielas: Use detergente o jabn y agua antes de la desinfeccin.  Luego, use un desinfectante domstico. Puede consultar una lista de los desinfectantes domsticos registrados en Financial controllerla Environmental Protection Agency (EPA) (Agencia de Proteccin Ambiental) aqu. SouthAmericaFlowers.co.ukcdc.gov/coronavirus 07/29/2018 Esta informacin no tiene Theme park managercomo fin reemplazar el consejo del mdico. Asegrese de hacerle al mdico cualquier pregunta que tenga. Document Released: 07/17/2018 Document Revised: 08/12/2018 Document Reviewed: 07/08/2018 Elsevier Patient Education  2020 Elsevier Inc.    Preguntas frecuentes sobre el COVID-19 COVID-19 Frequently Asked Questions El COVID-19 (enfermedad por coronavirus) es una infeccin causada por una gran familia de virus. Algunos virus causan National Cityenfermedades en las personas y otros causan enfermedades en animales tales como los camellos, los gatos y los murcilagos. En algunos casos, los virus que causan New York Life Insuranceenfermedades en los animales pueden transmitirse a los seres humanos. De dnde provino el coronavirus? En diciembre de 2019, Armeniahina le inform a la Organizacin Mundial de la Salud (OMS) acerca de varios casos de enfermedad pulmonar (enfermedad respiratoria humana). Estos casos estaban vinculados con un mercado abierto de frutos de mar y Germanyganado en la ciudad de [place]. El vnculo con el mercado de ganado y Liberty Globalmariscos sugiere que el virus puede haberse propagado de los animales a los Masonhumanos. Sin embargo, desde Chiropodistese primer brote en diciembre, tambin se ha demostrado que el virus se contagia de Sugar Bush Knollsuna persona a Educational psychologistotra. Cul es el nombre de la enfermedad y del virus? Nombre de la enfermedad Al principio, esta enfermedad se llam nuevo coronavirus. Esto se debe a que los cientficos determinaron que la enfermedad era causada por un nuevo virus respiratorio. La Organizacin Mundial de la Salud  (OMS) ahora ha dado a la enfermedad el nombre de COVID-19, o enfermedad por coronavirus. Nombre del virus El virus causante de la enfermedad se conoce como coronavirus de tipo 2 causante del sndrome respiratorio agudo grave (SARS-CoV-2). Ms informacin sobre el nombre de la enfermedad y el virus Organizacin Mundial de la [place]Salud (OMS): www.who.int/emergencies/diseases/novel-coronavirus-2019/technical-guidance/naming-the-coronavirus-disease-(covid-2019)-and-the-virus-that-causes-it Quines estn en riesgo de sufrir complicaciones debido a la enfermedad por coronavirus? Algunas personas pueden tener un riesgo ms alto de tener complicaciones debido a la enfermedad por coronavirus. Entre ellas se encuentran los ONEOKadultos mayores y las personas que tienen enfermedades crnicas, como enfermedad cardaca, diabetes y enfermedad pulmonar. Si tiene un riesgo ms alto de Sales executivetener complicaciones, tome estas precauciones adicionales:  Recruitment consultantvitar el contacto cercano con personas que estn enfermas o que tengan fiebre o tos. Permanecer al menos a una distancia de 3  a 6 pies (1-75m) de las [address], si es posible.  Lavarse las manos regularmente con agua y jabn durante al menos 20segundos.  Evitar tocarse la cara, la boca, la nariz y los ojos.  Tener a H. J. Heinz su casa, como alimentos, medicamentos y productos de limpieza.  Permanecer en su casa todo lo que sea posible.  Evitar las reuniones sociales y los viajes. Cmo se transmite la enfermedad causada por el coronavirus? El virus que causa la enfermedad por coronavirus se transmite fcilmente de Neomia Dear persona a otra (es contagioso). Tambin hay casos de enfermedad de transmisin comunitaria. Esto significa que la enfermedad se ha propagado a:  Personas que no tienen contacto conocido con Pharmacist, community.  Personas que no han viajado a zonas donde hay casos conocidos. Aparentemente, se transmite de [place] persona a otra a travs de  las YUM[employer] Brands se despiden al toser o al estornudar. Puedo contraer al virus al tocar superficies u objetos? Todava hay mucho que no se conoce acerca del virus que causa la enfermedad por coronavirus. Los cientficos basan gran parte de la informacin en lo que saben sobre virus similares, por ejemplo:  En general, los virus no sobreviven en superficies durante mucho tiempo. Necesitan un cuerpo humano (husped) para sobrevivir.  Es ms probable que el virus se contagie por contacto cercano con personas que estn enfermas (contacto directo), por ejemplo: ? Al estrechar las manos o abrazarse. ? Al inhalar las gotitas respiratorias que se desplazan por el aire. Esto puede ocurrir cuando una persona infectada tose o estornuda sobre o cerca de Economist.  Es menos probable que el virus se propague cuando una persona toca una superficie o un objeto sobre el que est el virus (contacto indirecto). El virus puede ingresar al cuerpo si la persona toca una superficie o un objeto y Express Scripts se toca la cara, los ojos, la nariz o la boca. Una persona puede contagiar el virus sin tener sntomas de la enfermedad? Puede ser posible que el virus se contagie antes de que la persona tenga sntomas de la enfermedad, pero muy probablemente esta no sea la principal forma en que el virus se est propagando. Es ms probable que el virus se propague al estar en contacto directo con personas que estn enfermas e inhalar las gotas respiratorias que una persona enferma despide al toser o Engineering geologist. Cules son los sntomas de la enfermedad causada por el coronavirus? Los sntomas varan de Neomia Dear persona a otra y pueden variar de leves a graves. Hershey Company, se pueden incluir los siguientes:  Speed.  Tos.  Cansancio, debilidad o fatiga.  Respiracin rpida o sensacin de falta el aire. Estos sntomas pueden aparecer en el trmino de 2 a [address] despus de haber estado expuesto al virus. Si presenta sntomas,  llame al mdico. Las personas con sntomas graves pueden necesitar atencin hospitalaria. Si estoy expuesto al virus, cunto tiempo tardan en aparecer los sntomas? Los sntomas de la enfermedad por coronavirus Magazine features editor en el trmino de 2 a 14 das despus de que una persona haya estado expuesta al virus. Si presenta sntomas, llame al mdico. Debo hacerme un anlisis de deteccin del virus? El mdico decidir si debe realizarse un anlisis en funcin de sus sntomas, antecedentes de exposicin y factores de [place]. Cmo realiza el mdico el anlisis para detectar este virus? Los mdicos obtienen muestras para enviar a Chiropractor. Estas muestras pueden incluir lo siguiente:  Tomar con un hisopo  lquido de Architectural technologist.  Pedirle que tosa mucosidad (esputo) para extraer lquido de los pulmones en un recipiente estril.  Tomar una muestra de [place].  Tomar una [place] de heces u [place]. Hay algn tratamiento o vacuna para este virus? Actualmente, no existe ninguna vacuna para prevenir la enfermedad por coronavirus. Adems, no existen Colgate Palmolive antibiticos o los antivirales para tratar el virus. Una persona que se enferma recibe tratamiento de apoyo, lo que significa reposo y lquidos. Una persona tambin puede aliviar sus sntomas con medicamentos de venta libre para tratar los estornudos, la tos y el goteo nasal. Son los mismos medicamentos que se toman para el resfro comn. Si presenta sntomas, llame al mdico. Las personas con sntomas graves pueden necesitar atencin hospitalaria. Qu puedo hacer para protegerme y proteger a mi familia de este virus?     Puede protegerse y proteger a su familia tomando las mismas medidas que tomara para prevenir el contagio de otros virus. Johnson & Johnson las siguientes medidas:  Lavarse las manos regularmente con agua y [place] durante al menos 20segundos. Usar desinfectante para manos con alcohol si no dispone de [place] y  [place].  Evitar tocarse la cara, la boca, la nariz y los ojos.  Toser o estornudar en un pauelo descartable, sobre su manga o codo. No toser o estornudar al aire ni cubrirse con la [place]. ? Si tose o estornuda en un pauelo de papel, deschelo inmediatamente y [employer].  Desinfectar los [employer] y las superficies que se tocan con frecuencia todos [place].  Evitar el contacto cercano con personas que estn enfermas o que tengan fiebre o tos. Permanecer al menos a una distancia de 3 a 6 pies (1-53m) de las [employer], si es posible.  Lennie Hummer en su casa si est enfermo, excepto para obtener atencin mdica. Llame al mdico antes de buscar atencin mdica.  Asegrese de [employer] las vacunas al da. Pregntele al mdico qu vacunas necesita. Qu debo hacer si tengo que viajar? Siga las recomendaciones relacionadas con los viajes de la autoridad de Psychiatrist, los CDC y Engineer, civil (consulting). Informacin y consejos para Nurse, adult for Disease Control and Prevention Insurance claims handler) (Centros para el Control y la Prevencin de Event organiser): [email or web address]  Organizacin Mundial de Radiographer, therapeutic (OMS): [email or web address].se [employer] riesgos y tome medidas para proteger su salud  El riesgo de Primary school teacher la enfermedad por coronavirus es ms alto si viaja a zonas con un brote o si est en contacto con viajeros que provienen de zonas donde hay un brote.  Lvese las manos con frecuencia y [place] higiene [place] para reducir el riesgo de contagiarse o transmitir el virus. Qu debo hacer si estoy enfermo? Instrucciones generales para detener la propagacin de la infeccin  Lavarse las manos regularmente con agua y jabn durante al menos 20segundos. Usar desinfectante para manos con alcohol si no dispone de [place] y [place].  Toser o estornudar en un pauelo descartable, sobre su manga o codo. No toser o estornudar al aire  ni cubrirse con la [place].  Si tose o estornuda en un pauelo de papel, deschelo inmediatamente y [employer].  Lanny Hurst en su casa a menos que deba recibir [employer]. Llame al mdico o a la autoridad de salud local antes de buscar atencin mdica.  Evite las zonas pblicas. No viaje en transporte pblico, de ser posible.  Si puede, use un barbijo si debe salir de la casa o si est en contacto cercano con alguien que no  est enfermo. Mantenga su casa limpia  Desinfecte los objetos y las superficies que se tocan con frecuencia todos [place]. Pueden incluir: ? Encimeras y [place]. ? Picaportes e interruptores de luz. ? Lavabos, fregaderos y grifos. ? Aparatos electrnicos tales como telfonos, controles remotos, teclados, computadoras y tabletas.  Lave los platos con agua jabonosa caliente o en el lavavajillas. Deje los platos para que se sequen al aire.  Lave la ropa con agua caliente. Evite infectar a otros miembros de la familia  Permita que los miembros de la familia sanos cuiden a los nios y las [place], si es posible. Si tiene que cuidar a los nios o las mascotas, lvese las manos con frecuencia y use un barbijo.  Duerma en una habitacin o cama diferentes, si es posible.  No comparta elementos personales, como afeitadoras, cepillos de dientes, desodorantes, peines, cepillos, toallas y [address] de [address]. Dnde buscar ms informacin Centers for Disease Control and Prevention (CDC)  Actualizaciones de informacin y novedades: CardRetirement.cz Organizacin Mundial de la Salud (OMS)  Actualizaciones de informacin y novedades: AffordableSalon.es  Tema de salud relacionado con el coronavirus: [email or web address]-craig.[email or web address]/  Preguntas y Environmental health practitioner sobre COVID-19: kruiseway.com  Registro mundial: who.sprinklr.com American Academy of Pediatrics (AAP) (Academia  Estadounidense de Pediatra)  Informacin para familias: www.healthychildren.org/English/health-issues/conditions/chest-lungs/Pages/2019-Novel-Coronavirus.aspx La situacin del coronavirus cambia rpidamente. Consulte el sitio web de su autoridad de Psychiatrist o los sitios web de los CDC y la OMS para enterarse de las novedades y noticias. Cundo debo comunicarme con un mdico?  Comunquese con su mdico si tiene sntomas de infeccin, como fiebre o tos, y: ? Arlean Hopping cerca de alguien que sabe que tiene la enfermedad por coronavirus. ? Arlean Hopping en contacto con una persona que presuntamente sufra de la enfermedad por coronavirus. ? Ha viajado fuera del pas. Cundo debo buscar asistencia mdica inmediata?  Busque ayuda de inmediato llamando al servicio de emergencias de su localidad (911 en los [place]) si tiene lo siguiente: ? Dificultad para respirar. ? Dolor u opresin en el pecho. ? Confusin. ? Labios y uas de [employer]. ? Dificultad para despertarse. ? Sntomas que empeoran. Informe al personal mdico de emergencias si cree que tiene la enfermedad por coronavirus. Resumen  Un nuevo virus respiratorio se propaga de Neomia Dear persona a otra y causa COVID-19 (enfermedad por coronavirus).  El virus que causa el COVID-19 parece diseminarse fcilmente. Se transmite de [place] persona a otra a travs de las [employer] se despiden al toser o al estornudar.  Los [employer] y las personas que tienen enfermedades crnicas tienen mayor riesgo de Writer enfermedad. Si tiene un riesgo ms alto de tener complicaciones, tome Engineer, materials.  Actualmente, no existe ninguna vacuna para prevenir la enfermedad por coronavirus. No existen medicamentos, como los antibiticos o los antivirales, para tratar el virus.  Puede protegerse y proteger a su familia al lavarse las manos con frecuencia, evitar tocarse la cara y cubrirse al toser y Engineering geologist. Esta informacin no tiene  Theme park manager el consejo del mdico. Asegrese de hacerle al mdico cualquier pregunta que tenga. Document Released: 07/21/2018 Document Revised: 07/21/2018 Document Reviewed: 07/21/2018 Elsevier Patient Education  2020 [employer].

## 2018-09-26 NOTE — Care Management (Addendum)
ase manager has acknowledged consult. Patient will be scheduled for televisit for hospital follow up on Monday, 09/29/18. [facility], and other community clinics are closed today in observance of the 4th of July Holiday. CM will contact patient with appointment information on Monday. Case manager will update congregational nurse, [place], of patient's discharge. [phone])     Ricki Miller, RN BSN Case Manager [phone]

## 2018-10-01 ENCOUNTER — Other Ambulatory Visit: Payer: Self-pay

## 2018-10-01 ENCOUNTER — Emergency Department (HOSPITAL_COMMUNITY): Payer: Medicaid Other

## 2018-10-01 ENCOUNTER — Inpatient Hospital Stay (HOSPITAL_COMMUNITY)
Admission: EM | Admit: 2018-10-01 | Discharge: 2018-10-09 | DRG: 177 | Disposition: A | Discharge status: Home / Self Care | Payer: Medicaid Other | Attending: Internal Medicine | Admitting: Internal Medicine

## 2018-10-01 ENCOUNTER — Encounter (HOSPITAL_COMMUNITY): Payer: Self-pay

## 2018-10-01 DIAGNOSIS — Z888 Allergy status to other drugs, medicaments and biological substances status: Secondary | ICD-10-CM

## 2018-10-01 DIAGNOSIS — J1289 Other viral pneumonia: Secondary | ICD-10-CM | POA: Diagnosis present

## 2018-10-01 DIAGNOSIS — E860 Dehydration: Secondary | ICD-10-CM | POA: Diagnosis not present

## 2018-10-01 DIAGNOSIS — I951 Orthostatic hypotension: Secondary | ICD-10-CM | POA: Diagnosis not present

## 2018-10-01 DIAGNOSIS — Z809 Family history of malignant neoplasm, unspecified: Secondary | ICD-10-CM

## 2018-10-01 DIAGNOSIS — Z833 Family history of diabetes mellitus: Secondary | ICD-10-CM | POA: Diagnosis not present

## 2018-10-01 DIAGNOSIS — E099 Drug or chemical induced diabetes mellitus without complications: Secondary | ICD-10-CM | POA: Diagnosis present

## 2018-10-01 DIAGNOSIS — Z713 Dietary counseling and surveillance: Secondary | ICD-10-CM

## 2018-10-01 DIAGNOSIS — J069 Acute upper respiratory infection, unspecified: Secondary | ICD-10-CM

## 2018-10-01 DIAGNOSIS — U071 COVID-19: Secondary | ICD-10-CM | POA: Diagnosis not present

## 2018-10-01 DIAGNOSIS — T50995A Adverse effect of other drugs, medicaments and biological substances, initial encounter: Secondary | ICD-10-CM | POA: Diagnosis not present

## 2018-10-01 DIAGNOSIS — R740 Nonspecific elevation of levels of transaminase and lactic acid dehydrogenase [LDH]: Secondary | ICD-10-CM | POA: Diagnosis not present

## 2018-10-01 DIAGNOSIS — R6510 Systemic inflammatory response syndrome (SIRS) of non-infectious origin without acute organ dysfunction: Secondary | ICD-10-CM

## 2018-10-01 DIAGNOSIS — J9601 Acute respiratory failure with hypoxia: Secondary | ICD-10-CM | POA: Diagnosis not present

## 2018-10-01 DIAGNOSIS — Y929 Unspecified place or not applicable: Secondary | ICD-10-CM | POA: Diagnosis not present

## 2018-10-01 DIAGNOSIS — Z3202 Encounter for pregnancy test, result negative: Secondary | ICD-10-CM | POA: Diagnosis not present

## 2018-10-01 DIAGNOSIS — Z8349 Family history of other endocrine, nutritional and metabolic diseases: Secondary | ICD-10-CM | POA: Diagnosis not present

## 2018-10-01 DIAGNOSIS — Z6841 Body Mass Index (BMI) 40.0 and over, adult: Secondary | ICD-10-CM | POA: Diagnosis not present

## 2018-10-01 DIAGNOSIS — Z79899 Other long term (current) drug therapy: Secondary | ICD-10-CM | POA: Diagnosis not present

## 2018-10-01 DIAGNOSIS — R0902 Hypoxemia: Secondary | ICD-10-CM

## 2018-10-01 DIAGNOSIS — T380X5A Adverse effect of glucocorticoids and synthetic analogues, initial encounter: Secondary | ICD-10-CM | POA: Diagnosis present

## 2018-10-01 DIAGNOSIS — R0602 Shortness of breath: Secondary | ICD-10-CM

## 2018-10-01 LAB — I-STAT BETA HCG BLOOD, ED (MC, WL, AP ONLY): I-stat hCG, quantitative: <5 m[IU]/mL (ref <5)

## 2018-10-01 LAB — COMPREHENSIVE METABOLIC PANEL
ALT: 115 U/L — ABNORMAL HIGH (ref 0–44)
AST: 54 U/L — ABNORMAL HIGH (ref 15–41)
Albumin: 4 g/dL (ref 3.5–5.0)
Alkaline Phosphatase: 65 U/L (ref 38–126)
Anion gap: 13 (ref 5–15)
BUN: 22 mg/dL — ABNORMAL HIGH (ref 6–20)
CO2: 28 mmol/L (ref 22–32)
Calcium: 9 mg/dL (ref 8.9–10.3)
Chloride: 100 mmol/L (ref 98–111)
Creatinine, Ser: 0.73 mg/dL (ref 0.44–1.00)
GFR calc Af Amer: >60 mL/min (ref >60)
GFR calc non Af Amer: >60 mL/min (ref >60)
Glucose, Bld: 112 mg/dL — ABNORMAL HIGH (ref 70–99)
Potassium: 4 mmol/L (ref 3.5–5.1)
Sodium: 141 mmol/L (ref 135–145)
Total Bilirubin: 0.5 mg/dL (ref 0.3–1.2)
Total Protein: 8.2 g/dL — ABNORMAL HIGH (ref 6.5–8.1)

## 2018-10-01 LAB — CBC WITH DIFFERENTIAL/PLATELET
Abs Immature Granulocytes: 0.05 10*3/uL (ref 0.00–0.07)
Basophils Absolute: 0 10*3/uL (ref 0.0–0.1)
Basophils Relative: 0 %
Eosinophils Absolute: 0 10*3/uL (ref 0.0–0.5)
Eosinophils Relative: 0 %
HCT: 49 % — ABNORMAL HIGH (ref 36.0–46.0)
Hemoglobin: 15.2 g/dL — ABNORMAL HIGH (ref 12.0–15.0)
Immature Granulocytes: 1 %
Lymphocytes Relative: 19 %
Lymphs Abs: 1.5 10*3/uL (ref 0.7–4.0)
MCH: 26.9 pg (ref 26.0–34.0)
MCHC: 31 g/dL (ref 30.0–36.0)
MCV: 86.7 fL (ref 80.0–100.0)
Monocytes Absolute: 0.4 10*3/uL (ref 0.1–1.0)
Monocytes Relative: 5 %
Neutro Abs: 6.1 10*3/uL (ref 1.7–7.7)
Neutrophils Relative %: 75 %
Platelets: 256 10*3/uL (ref 150–400)
RBC: 5.65 MIL/uL — ABNORMAL HIGH (ref 3.87–5.11)
RDW: 14.9 % (ref 11.5–15.5)
WBC: 8.1 10*3/uL (ref 4.0–10.5)
nRBC: 0 % (ref 0.0–0.2)

## 2018-10-01 LAB — FIBRINOGEN: Fibrinogen: 580 mg/dL — ABNORMAL HIGH (ref 210–475)

## 2018-10-01 LAB — LACTATE DEHYDROGENASE: LDH: 278 U/L — ABNORMAL HIGH (ref 98–192)

## 2018-10-01 LAB — C-REACTIVE PROTEIN: CRP: 5.4 mg/dL — ABNORMAL HIGH (ref <1.0)

## 2018-10-01 LAB — PROCALCITONIN: Procalcitonin: <0.1 ng/mL

## 2018-10-01 LAB — LACTIC ACID, PLASMA: Lactic Acid, Venous: 1.8 mmol/L (ref 0.5–1.9)

## 2018-10-01 LAB — FERRITIN: Ferritin: 117 ng/mL (ref 11–307)

## 2018-10-01 LAB — TRIGLYCERIDES: Triglycerides: 111 mg/dL (ref <150)

## 2018-10-01 MED ORDER — SODIUM CHLORIDE 0.9 % IV SOLN
500.0000 mg | Freq: Once | INTRAVENOUS | Status: AC
  Administered 2018-10-01: 14:00:00 500 mg via INTRAVENOUS
  Filled 2018-10-01: qty 500

## 2018-10-01 MED ORDER — DEXAMETHASONE SODIUM PHOSPHATE 10 MG/ML IJ SOLN
10.0000 mg | Freq: Once | INTRAMUSCULAR | Status: AC
  Administered 2018-10-01: 10 mg via INTRAVENOUS
  Filled 2018-10-01: qty 1

## 2018-10-01 MED ORDER — SODIUM CHLORIDE 0.9 % IV SOLN
1.0000 g | Freq: Once | INTRAVENOUS | Status: AC
  Administered 2018-10-01: 1 g via INTRAVENOUS
  Filled 2018-10-01: qty 10

## 2018-10-01 MED ORDER — SODIUM CHLORIDE 0.9 % IV SOLN
100.0000 mg | INTRAVENOUS | Status: AC
  Administered 2018-10-02 – 2018-10-05 (×4): 100 mg via INTRAVENOUS
  Filled 2018-10-01 (×4): qty 20

## 2018-10-01 MED ORDER — TOCILIZUMAB 400 MG/20ML IV SOLN
8.0000 mg/kg | Freq: Once | INTRAVENOUS | Status: AC
  Administered 2018-10-01: 16:00:00 1248 mg via INTRAVENOUS
  Filled 2018-10-01: qty 62.4

## 2018-10-01 MED ORDER — METHYLPREDNISOLONE SODIUM SUCC 40 MG IJ SOLR
40.0000 mg | Freq: Two times a day (BID) | INTRAMUSCULAR | Status: DC
  Administered 2018-10-01: 22:00:00 40 mg via INTRAVENOUS
  Filled 2018-10-01: qty 1

## 2018-10-01 MED ORDER — SODIUM CHLORIDE 0.9 % IV BOLUS
1000.0000 mL | Freq: Once | INTRAVENOUS | Status: AC
  Administered 2018-10-01: 12:00:00 1000 mL via INTRAVENOUS

## 2018-10-01 MED ORDER — SODIUM CHLORIDE 0.9 % IV SOLN
200.0000 mg | Freq: Once | INTRAVENOUS | Status: AC
  Administered 2018-10-01: 200 mg via INTRAVENOUS
  Filled 2018-10-01: qty 40

## 2018-10-01 NOTE — Progress Notes (Signed)
Pharmacy Brief Note  O:  ALT: 115 CXR: "increasing hazy infiltrates at both lung bases" SpO2: 99 % on 10 L NRB  A/P:  Patient meets criteria for remdesevir therapy. Will initiate remdesivir 200 mg iv once followed by 100 mg iv daily x 4 days. Will f/u ALT.   Napoleon Form, [facility] 10/01/18 8:48 PM

## 2018-10-01 NOTE — ED Triage Notes (Signed)
Per EMS: Pt called out for SOB, weakness, headache and fever.  Pt hypoxic on arrival at 82% RA.  Pt placed on non-rebreather at 10L.  BP: 118/60 HR: 100 O2: 100% non-rebreather Temp: 98.1

## 2018-10-01 NOTE — Progress Notes (Signed)
Patient arrived from [facility] at [phone]. Afebrile. On non rebreather at 10 liters. O2 SAT 95-97% at rest. Dyspnea on exertion noted when patient ambulated from stretcher to bed. Interpreter utilized to provide patient with unit introduction. Call bell within reach. Vital signs at the moment remain within normal limits (see flowsheet).

## 2018-10-01 NOTE — ED Notes (Signed)
Pt's O2 usage decreased to 6 L [place].  Pt maintaining O2 sats of 97-100%

## 2018-10-01 NOTE — ED Notes (Signed)
ED TO INPATIENT HANDOFF REPORT  Name/Age/Gender Karen Barajas 28 y.o. female  Code Status Code Status History    Date Active Date Inactive Code Status Order ID Comments User Context   09/25/2018 0125 09/26/2018 1623 Full Code [id number]  John Giovanni, MD ED   05/16/2017 0836 05/18/2017 1703 Full Code [id number]  Arabella Merles, CNM Inpatient   05/15/2017 1020 05/16/2017 0836 Full Code [id number]  Pincus Large, DO Inpatient   Advance Care Planning Activity      Home/SNF/Other Home  Chief Complaint covid+  Level of Care/Admitting Diagnosis ED Disposition    ED Disposition Condition Comment   Admit  Hospital Area: [facility] [[id number]]  Level of Care: Progressive [102]  Covid Evaluation: Confirmed COVID Positive  Diagnosis: COVID-19 virus infection [[id number]]  Admitting Physician: Briant Cedar [[id number]]  Attending Physician: Briant Cedar [[id number]]  Estimated length of stay: past midnight tomorrow  Certification:: I certify this patient will need inpatient services for at least 2 midnights  PT Class (Do Not Modify): Inpatient [101]  PT Acc Code (Do Not Modify): Private [1]       Medical History Past Medical History:  Diagnosis Date  . UTI (lower urinary tract infection)     Allergies Allergies  Allergen Reactions  . Flexeril [Cyclobenzaprine] Itching    Itchy dry throat, chest tightness    IV Location/Drains/Wounds Patient Lines/Drains/Airways Status   Active Line/Drains/Airways    Name:   Placement date:   Placement time:   Site:   Days:   Peripheral IV 10/01/18 Right;Upper Arm   10/01/18    1206    Arm   less than 1          Labs/Imaging Results for orders placed or performed during the hospital encounter of 10/01/18 (from the past 48 hour(s))  CBC WITH DIFFERENTIAL     Status: Abnormal   Collection Time: 10/01/18  9:56 AM  Result Value Ref Range   WBC 8.1 4.0 - 10.5 K/uL   RBC 5.65 (H) 3.87 - 5.11 MIL/uL   Hemoglobin 15.2 (H) 12.0 - 15.0 g/dL   HCT 03.4 (H) 74.2 - 59.5 %   MCV 86.7 80.0 - 100.0 fL   MCH 26.9 26.0 - 34.0 pg   MCHC 31.0 30.0 - 36.0 g/dL   RDW 63.8 75.6 - 43.3 %   Platelets 256 150 - 400 K/uL   nRBC 0.0 0.0 - 0.2 %   Neutrophils Relative % 75 %   Neutro Abs 6.1 1.7 - 7.7 K/uL   Lymphocytes Relative 19 %   Lymphs Abs 1.5 0.7 - 4.0 K/uL   Monocytes Relative 5 %   Monocytes Absolute 0.4 0.1 - 1.0 K/uL   Eosinophils Relative 0 %   Eosinophils Absolute 0.0 0.0 - 0.5 K/uL   Basophils Relative 0 %   Basophils Absolute 0.0 0.0 - 0.1 K/uL   Immature Granulocytes 1 %   Abs Immature Granulocytes 0.05 0.00 - 0.07 K/uL    Comment: Performed at [facility], [address], [place], [place] [address]  Comprehensive metabolic panel     Status: Abnormal   Collection Time: 10/01/18  9:56 AM  Result Value Ref Range   Sodium 141 135 - 145 mmol/L   Potassium 4.0 3.5 - 5.1 mmol/L   Chloride 100 98 - 111 mmol/L   CO2 28 22 - 32 mmol/L   Glucose, Bld 112 (H) 70 - 99 mg/dL   BUN 22 (H)  6 - 20 mg/dL   Creatinine, Ser 1.610.73 0.44 - 1.00 mg/dL   Calcium 9.0 8.9 - 09.610.3 mg/dL   Total Protein 8.2 (H) 6.5 - 8.1 g/dL   Albumin 4.0 3.5 - 5.0 g/dL   AST 54 (H) 15 - 41 U/L   ALT 115 (H) 0 - 44 U/L   Alkaline Phosphatase 65 38 - 126 U/L   Total Bilirubin 0.5 0.3 - 1.2 mg/dL   GFR calc non Af Amer >60 >60 mL/min   GFR calc Af Amer >60 >60 mL/min   Anion gap 13 5 - 15    Comment: Performed at [facility], [address] [place], [place] [address]  Procalcitonin     Status: None   Collection Time: 10/01/18  9:56 AM  Result Value Ref Range   Procalcitonin <0.10 ng/mL    Comment:        Interpretation: PCT (Procalcitonin) <= 0.5 ng/mL: Systemic infection (sepsis) is not likely. Local bacterial infection is possible. (NOTE)       Sepsis PCT Algorithm           Lower Respiratory Tract                                      Infection PCT Algorithm     ----------------------------     ----------------------------         PCT < 0.25 ng/mL                PCT < 0.10 ng/mL         Strongly encourage             Strongly discourage   discontinuation of antibiotics    initiation of antibiotics    ----------------------------     -----------------------------       PCT 0.25 - 0.50 ng/mL            PCT 0.10 - 0.25 ng/mL               OR       >80% decrease in PCT            Discourage initiation of                                            antibiotics      Encourage discontinuation           of antibiotics    ----------------------------     -----------------------------         PCT >= 0.50 ng/mL              PCT 0.26 - 0.50 ng/mL               AND        <80% decrease in PCT             Encourage initiation of                                             antibiotics       Encourage continuation           of antibiotics    ----------------------------     -----------------------------  PCT >= 0.50 ng/mL                  PCT > 0.50 ng/mL               AND         increase in PCT                  Strongly encourage                                      initiation of antibiotics    Strongly encourage escalation           of antibiotics                                     -----------------------------                                           PCT <= 0.25 ng/mL                                                 OR                                        > 80% decrease in PCT                                     Discontinue / Do not initiate                                             antibiotics Performed at [place] [facility] [address], [place], [place] [address]   Lactate dehydrogenase     Status: Abnormal   Collection Time: 10/01/18  9:56 AM  Result Value Ref Range   LDH 278 (H) 98 - 192 U/L    Comment: Performed at [facility], [place] [address], [place], [place] [address]  Ferritin     Status:  None   Collection Time: 10/01/18  9:56 AM  Result Value Ref Range   Ferritin 117 11 - 307 ng/mL    Comment: Performed at [facility], [place] [address] [place], [place] [address]  Triglycerides     Status: None   Collection Time: 10/01/18  9:56 AM  Result Value Ref Range   Triglycerides 111 <150 mg/dL    Comment: Performed at [facility], [place] [address] [place], [place] [address]  Fibrinogen     Status: Abnormal   Collection Time: 10/01/18  9:56 AM  Result Value Ref Range   Fibrinogen 580 (H) 210 - 475 mg/dL    Comment: Performed at [facility], [place] [address] [place], [place] [address]  C-reactive protein     Status: Abnormal  Collection Time: 10/01/18  9:56 AM  Result Value Ref Range   CRP 5.4 (H) <1.0 mg/dL    Comment: Performed at [facility], [address] [place], [place] [address]  Lactic acid, plasma     Status: None   Collection Time: 10/01/18 11:56 AM  Result Value Ref Range   Lactic Acid, Venous 1.8 0.5 - 1.9 mmol/L    Comment: Performed at [facility], [address] [place], [place] [address]  I-Stat beta hCG blood, ED     Status: None   Collection Time: 10/01/18 12:47 PM  Result Value Ref Range   I-stat hCG, quantitative <5.0 <5 mIU/mL   Comment 3            Comment:   GEST. AGE      CONC.  (mIU/mL)   <=1 WEEK        5 - 50     2 WEEKS       50 - 500     3 WEEKS       100 - 10,000     4 WEEKS     1,000 - 30,000        FEMALE AND NON-PREGNANT FEMALE:     LESS THAN 5 mIU/mL    Dg Chest Port 1 View  Result Date: 10/01/2018 CLINICAL DATA:  Pelvic positive. Worsening hypoxia. EXAM: PORTABLE CHEST 1 VIEW COMPARISON:  09/26/2018 FINDINGS: There are increased hazy infiltrates at both lung bases. Heart size and pulmonary vascularity remain normal. No bone abnormality. IMPRESSION: Increasing hazy infiltrates at both lung bases. Electronically Signed   By: Francene BoyersJames  Maxwell  M.D.   On: 10/01/2018 10:58    Pending Labs Unresulted Labs (From admission, onward)    Start     Ordered   10/01/18 0956  Lactic acid, plasma  Now then every 2 hours,   STAT     10/01/18 0957   10/01/18 0956  Blood Culture (routine x 2)  BLOOD CULTURE X 2,   STAT     10/01/18 0957   Signed and Held  SARS Coronavirus 2 [facility](Hospital order, Performed in [facility] hospital lab)  (Novel Coronavirus, [facility](Hospital Order))  Once,   R     Signed and Held   Signed and Held  ABO/Rh  Once,   R     Signed and Held   Signed and Held  CBC with Differential/Platelet  Daily,   R     Signed and Held   Signed and Held  Comprehensive metabolic panel  Daily,   R     Signed and Held   Signed and Held  C-reactive protein  Daily,   R     Signed and Held   Signed and Held  CK  Daily,   R     Signed and Held   Signed and Held  D-dimer, quantitative (not at [facility])  Daily,   R     Signed and Held   Signed and Held  Ferritin  Daily,   R     Signed and Held   Signed and Held  Magnesium  Daily,   R     Signed and Held   Signed and Held  Interleukin-6, Plasma  Daily,   R     Signed and Held   Signed and Held  Phosphorus  Daily,   R     Signed and Held   Signed and Held  Triglycerides  Daily,  R     Signed and Held   Signed and Held  Culture, blood (Routine X 2) w Reflex to ID Panel  BLOOD CULTURE X 2,   R     Signed and Held          Vitals/Pain Today's Vitals   10/01/18 1345 10/01/18 1400 10/01/18 1630 10/01/18 1700  BP:  122/71 (!) 137/91 136/72  Pulse: (!) 110 (!) 107 (!) 122 (!) 103  Resp: (!) 29 (!) 28 (!) 22 (!) 32  Temp:      TempSrc:      SpO2: 100% 100% (!) 87% 98%  Weight:        Isolation Precautions Airborne and Contact precautions  Medications Medications  tocilizumab (ACTEMRA) 8 mg/kg = 1,248 mg in sodium chloride 0.9 % 100 mL infusion (1,248 mg Intravenous New Bag/Given 10/01/18 1618)  sodium chloride 0.9 % bolus 1,000 mL (0 mLs Intravenous Stopped 10/01/18 1333)  cefTRIAXone  (ROCEPHIN) 1 g in sodium chloride 0.9 % 100 mL IVPB (0 g Intravenous Stopped 10/01/18 1333)  azithromycin (ZITHROMAX) 500 mg in sodium chloride 0.9 % 250 mL IVPB (0 mg Intravenous Stopped 10/01/18 1547)  dexamethasone (DECADRON) injection 10 mg (10 mg Intravenous Given 10/01/18 1612)    Mobility walks with person assist

## 2018-10-01 NOTE — ED Provider Notes (Signed)
[facility]-EMERGENCY DEPT Provider Note   CSN: [id number] Arrival date & time: 10/01/18  0944  History   Chief Complaint Chief Complaint  Patient presents with  . COVID +  . Shortness of Breath   HPI Karen Barajas is a 28 y.o. female with past medical history significant for morbid obesity, COVID-19 positive who presents for evaluation of shortness of breath and weakness.  Patient recently hospitalized on 09/24/2022 COVID 19+ infection.  Discharged 2 days later on 09/26/2018.  Patient states she has had increasing shortness of breath, weakness and fever at home.  Temperature max 102.0 at home.  She had generalized headache over the last week.  Denies sudden onset thunderclap headache, neck stiffness or neck rigidity.  Cough productive of light yellow sputum.  Generalized weakness, denies unilateral weakness, facial droop, slurred speech.  Patient states she is overall feeling poor since she was discharged from the hospital.  Now has multiple family members with similar symptoms.  Denies nausea, vomiting, chest pain, abdominal pain, diarrhea, dysuria.  Per EMS, patient hypoxia 82% on room air on arrival.  She was placed on nonrebreather at 10 L.  Oxygen saturations improved to 100%.  She is mildly tachycardic to 100.  Temperature 98.0.  History obtained from patient, past medical records, EMS.  Medical spanish interpreter was used.    HPI  Past Medical History:  Diagnosis Date  . UTI (lower urinary tract infection)     Patient Active Problem List   Diagnosis Date Noted  . COVID-19 virus infection 09/24/2018  . Morbid obesity (HCC) 07/05/2017    Past Surgical History:  Procedure Laterality Date  . NO PAST SURGERIES       OB History    Gravida  1   Para  1   Term  1   Preterm  0   AB  0   Living  1     SAB  0   TAB  0   Ectopic  0   Multiple  0   Live Births  0            Home Medications    Prior to Admission medications    Medication Sig Start Date End Date Taking? Authorizing Provider  acetaminophen (TYLENOL) 325 MG tablet Take 325 mg by mouth every 6 (six) hours as needed for mild pain, moderate pain, fever or headache.    [provider]  guaiFENesin-dextromethorphan (ROBITUSSIN DM) 100-10 MG/5ML syrup Take 10 mLs by mouth every 4 (four) hours as needed for cough. 09/26/18   Lonia BloodMcClung, Jeffrey T, MD  predniSONE (DELTASONE) 20 MG tablet Take 3 tablets (60 mg total) by mouth 2 (two) times daily with a meal for 2 days, THEN 2 tablets (40 mg total) 2 (two) times daily with a meal for 2 days, THEN 1 tablet (20 mg total) 2 (two) times daily with a meal for 2 days, THEN 0.5 tablets (10 mg total) 2 (two) times daily with a meal for 2 days, THEN 0.5 tablets (10 mg total) daily with breakfast for 2 days. 09/26/18 10/06/18  Lonia BloodMcClung, Jeffrey T, MD    Family History Family History  Problem Relation Age of Onset  . Diabetes Maternal Grandmother   . Hyperlipidemia Maternal Grandfather   . Cancer Paternal Grandfather     Social History Social History   Tobacco Use  . Smoking status: Never Smoker  . Smokeless tobacco: Never Used  Substance Use Topics  . Alcohol use: No  .  Drug use: No     Allergies   Flexeril [cyclobenzaprine]   Review of Systems Review of Systems  Constitutional: Positive for appetite change, chills, fatigue and fever.  HENT: Negative.   Respiratory: Positive for cough and shortness of breath. Negative for apnea, choking, chest tightness, wheezing and stridor.   Cardiovascular: Negative.   Gastrointestinal: Negative.   Genitourinary: Negative.   Musculoskeletal: Negative.   Skin: Negative.   Neurological: Negative.   All other systems reviewed and are negative.    Physical Exam Updated Vital Signs BP 103/81   Pulse (!) 109   Temp 98.5 F (36.9 C) (Oral)   Resp (!) 26   Wt (!) 156 kg   SpO2 100%   BMI 64.10 kg/m   Physical Exam Vitals signs and nursing note reviewed.   Constitutional:      General: She is not in acute distress.    Appearance: She is obese. She is ill-appearing. She is not toxic-appearing or diaphoretic.  HENT:     Head: Normocephalic and atraumatic.     Jaw: There is normal jaw occlusion.     Nose:     Comments: Clear rhinorrhea and congestion to bilateral nares.  No sinus tenderness.    Mouth/Throat:     Lips: Pink.     Mouth: Mucous membranes are moist.     Pharynx: Oropharynx is clear. Uvula midline.     Comments: Posterior oropharynx clear.  Mucous membranes moist. Phonation normal. Neck:     Musculoskeletal: Full passive range of motion without pain and normal range of motion.     Trachea: Trachea and phonation normal.     Meningeal: Brudzinski's sign and Kernig's sign absent.     Comments: No Neck stiffness or neck rigidity.  No meningismus.  No cervical lymphadenopathy. Cardiovascular:     Rate and Rhythm: Tachycardia present.     Pulses: Normal pulses.     Heart sounds: Normal heart sounds.     Comments: No murmurs rubs or gallops. Pulmonary:     Effort: No accessory muscle usage.     Comments: Clear to auscultation bilaterally without wheeze, rhonchi or rales.  No accessory muscle usage.  Speaks in short sentences. On 10 L non rebreather with oxygen at 100% Abdominal:     Comments: Soft, nontender without rebound or guarding.  No CVA tenderness.  Musculoskeletal:     Comments: Moves all 4 extremities without difficulty.  Lower extremities without edema, erythema or warmth.  Skin:    Comments: Brisk capillary refill.  No rashes or lesions.  Neurological:     Mental Status: She is alert.     Comments: Ambulatory in department without difficulty.  Cranial nerves II through XII grossly intact.  No facial droop.  No aphasia.    ED Treatments / Results  Labs (all labs ordered are listed, but only abnormal results are displayed) Labs Reviewed  CBC WITH DIFFERENTIAL/PLATELET - Abnormal; Notable for the following  components:      Result Value   RBC 5.65 (*)    Hemoglobin 15.2 (*)    HCT 49.0 (*)    All other components within normal limits  COMPREHENSIVE METABOLIC PANEL - Abnormal; Notable for the following components:   Glucose, Bld 112 (*)    BUN 22 (*)    Total Protein 8.2 (*)    AST 54 (*)    ALT 115 (*)    All other components within normal limits  LACTATE DEHYDROGENASE - Abnormal; Notable for  the following components:   LDH 278 (*)    All other components within normal limits  FIBRINOGEN - Abnormal; Notable for the following components:   Fibrinogen 580 (*)    All other components within normal limits  C-REACTIVE PROTEIN - Abnormal; Notable for the following components:   CRP 5.4 (*)    All other components within normal limits  CULTURE, BLOOD (ROUTINE X 2)  CULTURE, BLOOD (ROUTINE X 2)  LACTIC ACID, PLASMA  PROCALCITONIN  FERRITIN  TRIGLYCERIDES  LACTIC ACID, PLASMA  I-STAT BETA HCG BLOOD, ED ([facility], [facility], [facility] ONLY)    EKG EKG Interpretation  Date/Time:  Wednesday October 01 2018 11:20:04 EDT Ventricular Rate:  114 PR Interval:    QRS Duration: 80 QT Interval:  311 QTC Calculation: 431 R Axis:   82 Text Interpretation:  Sinus tachycardia Artifact Borderline T wave abnormalities similar to 02/25/2019 Confirmed by Mancel BaleWentz, Elliott [phone]([id number]) on 10/01/2018 11:31:39 AM   Radiology Dg Chest Port 1 View  Result Date: 10/01/2018 CLINICAL DATA:  Pelvic positive. Worsening hypoxia. EXAM: PORTABLE CHEST 1 VIEW COMPARISON:  09/26/2018 FINDINGS: There are increased hazy infiltrates at both lung bases. Heart size and pulmonary vascularity remain normal. No bone abnormality. IMPRESSION: Increasing hazy infiltrates at both lung bases. Electronically Signed   By: Francene BoyersJames  Maxwell M.D.   On: 10/01/2018 10:58    Procedures .Critical Care Performed by: Linwood DibblesHenderly, Eliberto Sole A, PA-C Authorized by: Linwood DibblesHenderly, Kharee Lesesne A, PA-C   Critical care provider statement:    Critical care time (minutes):  45   Critical  care was necessary to treat or prevent imminent or life-threatening deterioration of the following conditions:  Respiratory failure and sepsis   Critical care was time spent personally by me on the following activities:  Discussions with consultants, evaluation of patient's response to treatment, examination of patient, ordering and performing treatments and interventions, ordering and review of laboratory studies, ordering and review of radiographic studies, pulse oximetry, re-evaluation of patient's condition, obtaining history from patient or surrogate and review of old charts   (including critical care time)  Medications Ordered in ED Medications  azithromycin (ZITHROMAX) 500 mg in sodium chloride 0.9 % 250 mL IVPB (has no administration in time range)  sodium chloride 0.9 % bolus 1,000 mL (1,000 mLs Intravenous New Bag/Given 10/01/18 1224)  cefTRIAXone (ROCEPHIN) 1 g in sodium chloride 0.9 % 100 mL IVPB (1 g Intravenous New Bag/Given 10/01/18 1225)   Initial Impression / Assessment and Plan / ED Course  I have reviewed the triage vital signs and the nursing notes.  Pertinent labs & imaging results that were available during my care of the patient were reviewed by me and considered in my medical decision making (see chart for details).  28 year old female, ill-appearing, known COVID-19 positive presents for evaluation of worsening shortness of breath, fever and cough.  EMS arrival she was hypoxic 82% on room air.  Placed 10 L on non-rebreather oxygen saturation improvement to 100%.  Lungs without significant abnormality.  She is mildly tachycardic to 108.  Heart clear, no neck stiffness or neck rigidity.  No meningismus.  Abdomen soft, nontender without rebound or guarding.  Lower extremities without edema, erythema, ecchymosis or warmth.  Symptoms not consistent with ACS, PE or dissection.  Likely worsening of COVID infection.  Has been on p.o. steroids at home, Robitussin and albuterol without  relief of symptoms.  Will obtain labs, EKG, chest x-ray however patient will need admission for acute hypoxic respiratory failure.  Will not obtain  additional COVID testing as she was +1-week ago. Currently on 10L non re-breather for hypoxia on initial evaluation.  No chest pain, hemoptysis, risk factors for VTE.  I have low suspicion for ACS, PE, dissection as cause of her hypoxia.  CTA 09/25/18--negative for PE however bilateral lower lobe pneumonia, consistent with given pneumonia.  Labs and imaging personally reviewed. Labs and imaging from previous hospitalization reviewed. Plain film chest shows worsening bilateral hazy infiltrates EKG with sinus tachycardia. No STEMI  1230: Patient on 6L nasal cannula.  Given IVF, ABX for respiratory infection.  Patient will need admission for acute hypoxic respiratory failure in setting of COVID-19 viral infection. Patient meets Sepsis criteria with tachycardia, tachypnea, hypoxia and source of infection (respiratory). Given IVF and ABX.  1300: Consulted with Dr. Sharolyn DouglasEzenduka with [facility] who agrees to evaluate patient for admission.  The patient appears reasonably stabilized for admission considering the current resources, flow, and capabilities available in the ED at this time, and I doubt any other [facility] further screening and/or treatment in the ED prior to admission.  Diella Casillas Beola Barajas was evaluated in Emergency Department on 10/01/2018 for the symptoms described in the history of present illness. She was evaluated in the context of the global COVID-19 pandemic, which necessitated consideration that the patient might be at risk for infection with the SARS-CoV-2 virus that causes COVID-19. Institutional protocols and algorithms that pertain to the evaluation of patients at risk for COVID-19 are in a state of rapid change based on information released by regulatory bodies including the CDC and federal and state organizations. These policies and algorithms  were followed during the patient's care in the ED.     Final Clinical Impressions(s) / ED Diagnoses   Final diagnoses:  COVID-19 virus infection  Hypoxia  SIRS (systemic inflammatory response syndrome) [facility](HCC)    ED Discharge Orders    None       Alakai Macbride A, PA-C 10/01/18 1327    Mancel BaleWentz, Elliott, MD 10/01/18 [phone]

## 2018-10-01 NOTE — H&P (Addendum)
History and Physical  Karen ChromanSelene Casillas Parra ZOX:[id number] DOB: [birth year] DOA: 10/01/2018   Patient coming from: Home & is able to ambulate  Chief Complaint: Shortness of breath  HPI: Karen Barajas is a 28 y.o. female with medical history significant morbid obesity, [facility] with diagnosis of COVID-19 PNA, presents to the ED complaining of shortness of breath for the past 2 days.  Patient was recently hospitalized on 09/24/2018 and discharged on 09/26/2018 after being managed for COVID using steroid only.  Patient stated she started having worsening shortness of breath about 2 days ago, with some subjective fever.  Reported some cough productive of whitish sputum.  Reports generalized weakness.  Of note, lives with multiple family members including her parents, siblings and child.  Advised them to get tested.  Patient speaks only Spanish, and an interpreter was used to obtain history.  ED Course: Patient noted to be hypoxic about 82% on room air on arrival, patient was placed on a nonrebreather with improvement of her sats in the 90s.  She was noted to be tachycardic and currently afebrile.  Review of Systems: Review of systems are otherwise negative   Past Medical History:  Diagnosis Date  . UTI (lower urinary tract infection)    Past Surgical History:  Procedure Laterality Date  . NO PAST SURGERIES      Social History:  reports that she has never smoked. She has never used smokeless tobacco. She reports that she does not drink alcohol or use drugs.   Allergies  Allergen Reactions  . Flexeril [Cyclobenzaprine] Itching    Itchy dry throat, chest tightness    Family History  Problem Relation Age of Onset  . Diabetes Maternal Grandmother   . Hyperlipidemia Maternal Grandfather   . Cancer Paternal Grandfather       Prior to Admission medications   Medication Sig Start Date End Date Taking? Authorizing Provider  predniSONE (DELTASONE) 20  MG tablet Take 3 tablets (60 mg total) by mouth 2 (two) times daily with a meal for 2 days, THEN 2 tablets (40 mg total) 2 (two) times daily with a meal for 2 days, THEN 1 tablet (20 mg total) 2 (two) times daily with a meal for 2 days, THEN 0.5 tablets (10 mg total) 2 (two) times daily with a meal for 2 days, THEN 0.5 tablets (10 mg total) daily with breakfast for 2 days. 09/26/18 10/06/18 Yes Lonia BloodMcClung, Jeffrey T, MD  acetaminophen (TYLENOL) 325 MG tablet Take 325 mg by mouth every 6 (six) hours as needed for mild pain, moderate pain, fever or headache.    [provider]  guaiFENesin-dextromethorphan (ROBITUSSIN DM) 100-10 MG/5ML syrup Take 10 mLs by mouth every 4 (four) hours as needed for cough. 09/26/18   Lonia BloodMcClung, Jeffrey T, MD    Physical Exam: BP 112/75 (BP Location: Left Arm)   Pulse 97   Temp 99.7 F (37.6 C) (Oral)   Resp (!) 35   Wt (!) 156 kg   SpO2 93%   BMI 64.10 kg/m   General: AAO x3, NAD at rest Eyes: Normal ENT: Normal Neck: Supple Cardiovascular: S1, S2 present Respiratory: CTA B Abdomen: Soft, obese, nontender, bowel sounds present Skin: Normal Musculoskeletal: No pedal edema bilaterally Psychiatric: Normal mood Neurologic: No focal neurologic deficit          Labs on Admission:  Basic Metabolic Panel: Recent Labs  Lab 09/24/18 2122 09/26/18 0415 10/01/18 0956  NA 135 139 141  K 4.2  4.5 4.0  CL 104 107 100  CO2 20* 22 28  GLUCOSE 119* 151* 112*  BUN 11 14 22*  CREATININE 0.61 0.41* 0.73  CALCIUM 9.0 8.8* 9.0   Liver Function Tests: Recent Labs  Lab 09/24/18 2122 09/26/18 0415 10/01/18 0956  AST 26 24 54*  ALT 27 28 115*  ALKPHOS 75 73 65  BILITOT 0.4 0.2* 0.5  PROT 7.2 7.6 8.2*  ALBUMIN 3.9 4.0 4.0   No results for input(s): LIPASE, AMYLASE in the last 168 hours. No results for input(s): AMMONIA in the last 168 hours. CBC: Recent Labs  Lab 09/24/18 2122 09/26/18 0415 10/01/18 0956  WBC 6.8 8.0 8.1  NEUTROABS 5.6 6.6 6.1  HGB  13.4 12.8 15.2*  HCT 42.1 41.5 49.0*  MCV 84.7 86.5 86.7  PLT 208 205 256   Cardiac Enzymes: No results for input(s): CKTOTAL, CKMB, CKMBINDEX, TROPONINI in the last 168 hours.  BNP (last 3 results) No results for input(s): BNP in the last 8760 hours.  ProBNP (last 3 results) No results for input(s): PROBNP in the last 8760 hours.  CBG: No results for input(s): GLUCAP in the last 168 hours.  Radiological Exams on Admission: Dg Chest Port 1 View  Result Date: 10/01/2018 CLINICAL DATA:  Pelvic positive. Worsening hypoxia. EXAM: PORTABLE CHEST 1 VIEW COMPARISON:  09/26/2018 FINDINGS: There are increased hazy infiltrates at both lung bases. Heart size and pulmonary vascularity remain normal. No bone abnormality. IMPRESSION: Increasing hazy infiltrates at both lung bases. Electronically Signed   By: Lorriane Shire M.D.   On: 10/01/2018 10:58    EKG: Independently reviewed.  Sinus rhythm, no acute ST changes  Assessment/Plan Present on Admission: . Morbid obesity ([place]) . COVID-19 virus infection  Principal Problem:   COVID-19 virus infection Active Problems:   Morbid obesity ([place])   Acute hypoxic respiratory failure likely 2/2 COVID pneumonia History as above Afebrile, no leukocytosis Requiring nonrebreather, desaturating to the 80s on room air CRP elevated, LDH elevated, procalcitonin <0.10, d-dimer 1.41, fibrinogen 580 Pregnancy test negative CTA chest showed bilateral lower lobe pneumonia, negative for PE.  Consider imposed bacterial pneumonia or aspiration Was given 1 dose of ceftriaxone, azithromycin, consider continuing Start Decadron, Actemra Remdisivir once at [place] to [place]  Morbid obesity Lifestyle modification advised    DVT prophylaxis: Lovenox  Code Status: Full  Family Communication: None at bedside  Disposition Plan: To be determined  Consults called: None  Admission status: Inpatient    Alma Friendly MD [facility]   If  7PM-7AM, please contact night-coverage [email or web address]   10/01/2018, 7:17 PM

## 2018-10-01 NOTE — Plan of Care (Signed)
Using the interpreter line, educated the pt regarding the plan of care, reason for profound SOB with exertion and use of the meds that are typically ordered for patients with COVID 19.

## 2018-10-01 NOTE — ED Notes (Signed)
Bed: WA12 Expected date:  Expected time:  Means of arrival:  Comments: EMS/COVID + 

## 2018-10-01 NOTE — ED Notes (Signed)
Pt's O2 saturation decreased to mid 80's on 6L [place] when going to potty chair.  Pt placed on non-rebreather after resting in bed a few minutes when her O2 sats did not improve.

## 2018-10-01 NOTE — ED Notes (Signed)
Pt difficult stick.  RN 2x attempt.  IV team consult placed

## 2018-10-02 ENCOUNTER — Inpatient Hospital Stay (HOSPITAL_COMMUNITY): Payer: Medicaid Other

## 2018-10-02 LAB — COMPREHENSIVE METABOLIC PANEL
ALT: 93 U/L — ABNORMAL HIGH (ref 0–44)
AST: 35 U/L (ref 15–41)
Albumin: 3.5 g/dL (ref 3.5–5.0)
Alkaline Phosphatase: 56 U/L (ref 38–126)
Anion gap: 11 (ref 5–15)
BUN: 18 mg/dL (ref 6–20)
CO2: 26 mmol/L (ref 22–32)
Calcium: 8.7 mg/dL — ABNORMAL LOW (ref 8.9–10.3)
Chloride: 101 mmol/L (ref 98–111)
Creatinine, Ser: 0.44 mg/dL (ref 0.44–1.00)
GFR calc Af Amer: >60 mL/min (ref >60)
GFR calc non Af Amer: >60 mL/min (ref >60)
Glucose, Bld: 279 mg/dL — ABNORMAL HIGH (ref 70–99)
Potassium: 4.9 mmol/L (ref 3.5–5.1)
Sodium: 138 mmol/L (ref 135–145)
Total Bilirubin: 0.3 mg/dL (ref 0.3–1.2)
Total Protein: 7.4 g/dL (ref 6.5–8.1)

## 2018-10-02 LAB — CBC
HCT: 43.3 % (ref 36.0–46.0)
Hemoglobin: 13 g/dL (ref 12.0–15.0)
MCH: 26.2 pg (ref 26.0–34.0)
MCHC: 30 g/dL (ref 30.0–36.0)
MCV: 87.1 fL (ref 80.0–100.0)
Platelets: 218 10*3/uL (ref 150–400)
RBC: 4.97 MIL/uL (ref 3.87–5.11)
RDW: 14.8 % (ref 11.5–15.5)
WBC: 3.3 10*3/uL — ABNORMAL LOW (ref 4.0–10.5)
nRBC: 0 % (ref 0.0–0.2)

## 2018-10-02 LAB — PHOSPHORUS: Phosphorus: 4 mg/dL (ref 2.5–4.6)

## 2018-10-02 LAB — FERRITIN: Ferritin: 118 ng/mL (ref 11–307)

## 2018-10-02 LAB — GLUCOSE, CAPILLARY
Glucose-Capillary: 294 mg/dL — ABNORMAL HIGH (ref 70–99)
Glucose-Capillary: 257 mg/dL — ABNORMAL HIGH (ref 70–99)

## 2018-10-02 LAB — D-DIMER, QUANTITATIVE: D-Dimer, Quant: 0.47 ug/mL-FEU (ref 0.00–0.50)

## 2018-10-02 LAB — BRAIN NATRIURETIC PEPTIDE: B Natriuretic Peptide: 5.8 pg/mL (ref 0.0–100.0)

## 2018-10-02 LAB — MAGNESIUM: Magnesium: 2.4 mg/dL (ref 1.7–2.4)

## 2018-10-02 LAB — C-REACTIVE PROTEIN: CRP: 7.8 mg/dL — ABNORMAL HIGH (ref <1.0)

## 2018-10-02 LAB — PROCALCITONIN: Procalcitonin: <0.1 ng/mL

## 2018-10-02 MED ORDER — INSULIN ASPART 100 UNIT/ML ~~LOC~~ SOLN
0.0000 [IU] | Freq: Every day | SUBCUTANEOUS | Status: DC
  Administered 2018-10-02 – 2018-10-06 (×4): 2 [IU] via SUBCUTANEOUS

## 2018-10-02 MED ORDER — INSULIN ASPART 100 UNIT/ML ~~LOC~~ SOLN
0.0000 [IU] | Freq: Three times a day (TID) | SUBCUTANEOUS | Status: DC
  Administered 2018-10-02 (×2): 8 [IU] via SUBCUTANEOUS
  Administered 2018-10-03: 3 [IU] via SUBCUTANEOUS
  Administered 2018-10-03 (×2): 11 [IU] via SUBCUTANEOUS
  Administered 2018-10-04: 3 [IU] via SUBCUTANEOUS
  Administered 2018-10-04: 5 [IU] via SUBCUTANEOUS
  Administered 2018-10-04: 13:00:00 11 [IU] via SUBCUTANEOUS
  Administered 2018-10-05: 8 [IU] via SUBCUTANEOUS
  Administered 2018-10-05: 11 [IU] via SUBCUTANEOUS
  Administered 2018-10-05: 10:00:00 3 [IU] via SUBCUTANEOUS
  Administered 2018-10-06: 11 [IU] via SUBCUTANEOUS
  Administered 2018-10-06: 09:00:00 5 [IU] via SUBCUTANEOUS
  Administered 2018-10-06: 13:00:00 15 [IU] via SUBCUTANEOUS
  Administered 2018-10-07: 5 [IU] via SUBCUTANEOUS
  Administered 2018-10-07: 09:00:00 2 [IU] via SUBCUTANEOUS
  Administered 2018-10-07: 5 [IU] via SUBCUTANEOUS
  Administered 2018-10-08 (×2): 3 [IU] via SUBCUTANEOUS

## 2018-10-02 MED ORDER — HYDROCOD POLST-CPM POLST ER 10-8 MG/5ML PO SUER
5.0000 mL | Freq: Two times a day (BID) | ORAL | Status: DC | PRN
  Administered 2018-10-02 – 2018-10-05 (×9): 5 mL via ORAL
  Filled 2018-10-02 (×9): qty 5

## 2018-10-02 MED ORDER — METHYLPREDNISOLONE SODIUM SUCC 125 MG IJ SOLR
60.0000 mg | Freq: Two times a day (BID) | INTRAMUSCULAR | Status: DC
  Administered 2018-10-02 – 2018-10-05 (×8): 60 mg via INTRAVENOUS
  Filled 2018-10-02 (×8): qty 2

## 2018-10-02 MED ORDER — ENOXAPARIN SODIUM 40 MG/0.4ML ~~LOC~~ SOLN
40.0000 mg | Freq: Two times a day (BID) | SUBCUTANEOUS | Status: DC
  Administered 2018-10-02 – 2018-10-09 (×15): 40 mg via SUBCUTANEOUS
  Filled 2018-10-02 (×15): qty 0.4

## 2018-10-02 MED ORDER — ACETAMINOPHEN 325 MG PO TABS
650.0000 mg | ORAL_TABLET | Freq: Four times a day (QID) | ORAL | Status: DC | PRN
  Administered 2018-10-02 – 2018-10-07 (×3): 650 mg via ORAL
  Filled 2018-10-02 (×3): qty 2

## 2018-10-02 MED ORDER — DM-GUAIFENESIN ER 30-600 MG PO TB12
1.0000 | ORAL_TABLET | Freq: Two times a day (BID) | ORAL | Status: DC
  Administered 2018-10-02 – 2018-10-09 (×15): 1 via ORAL
  Filled 2018-10-02 (×15): qty 1

## 2018-10-02 MED ORDER — ONDANSETRON HCL 4 MG/2ML IJ SOLN
4.0000 mg | Freq: Four times a day (QID) | INTRAMUSCULAR | Status: DC | PRN
  Administered 2018-10-02: 4 mg via INTRAVENOUS
  Filled 2018-10-02: qty 2

## 2018-10-02 NOTE — Progress Notes (Signed)
Pt is complaining of chest pain, worse after coughing and transferring to the bedside commode. Paged MD because nothing was ordered for pain or cough. Orders received.

## 2018-10-02 NOTE — Progress Notes (Signed)
PROGRESS NOTE                                                                                                                                                                                                             Patient Demographics:    Karen Barajas, is a 28 y.o. female, DOB - [birth year], XIH:[id number]  Outpatient Primary MD for the patient is Patient, No Pcp Per    LOS - 1  Admit date - 10/01/2018    Chief Complaint  Patient presents with  . COVID  . Shortness of Breath       Brief Narrative  Karen Barajas is a 28 y.o. female with medical history significant morbid obesity, recently discharged from [facility] with diagnosis of COVID-19 PNA,  she presented with worsening shortness of breath and was readmitted for further care.   Subjective:    [employer] today has, No headache, No chest pain, No abdominal pain - No Nausea, No new weakness tingling or +ve Cough - SOB.     Assessment  & Plan :     1. Acute Hypoxic Resp. Failure due to Acute Covid 19 Viral Pneumonitis during the ongoing 2020 Covid 19 Pandemic - she needed 10Lit HF o2 upon admission, she has been placed on IV steroids also received IV Remdisvir along with Actemra on the day of admission.  Risks and benefits were explained to the patient by the admitting physician.  She is much improved, continue titrating down oxygen, advance activity, added flutter valve and I-S for pulmonary toiletry, encouraged to sit up in the daytime in chair and prone when in bed at night.    COVID-19 Labs  Recent Labs    10/01/18 0956 10/02/18 0500  DDIMER  --  0.47  FERRITIN 117 118  LDH 278*  --   CRP 5.4* 7.8*    Lab Results  Component Value Date   SARSCOV2NAA POSITIVE (A) 09/24/2018     Hepatic Function Latest Ref Rng & Units 10/02/2018 10/01/2018 09/26/2018  Total Protein 6.5 - 8.1 g/dL 7.4 8.2(H) 7.6  Albumin 3.5 - 5.0 g/dL  3.5 4.0 4.0  AST 15 - 41 U/L 35 54(H) 24  ALT 0 - 44 U/L 93(H) 115(H) 28  Alk Phosphatase 38 - 126 U/L 56 65 73  Total Bilirubin 0.3 -  1.2 mg/dL 0.3 0.5 0.2(L)        Component Value Date/Time   BNP 5.8 10/02/2018 0500      2.  Morbid obesity.  Follow with PCP for weight loss.  3.  Mild transaminitis due to combination of COVID-19 infection, Actemra and IV REMDESIVIR use.  Monitor.    Condition -  Guarded   Family Communication  :  None  Code Status :  Full  Diet :    Diet Order            Diet regular Room service appropriate? Yes; Fluid consistency: Thin  Diet effective now               Disposition Plan  :  Med  Consults  :  None  Procedures  :  CTA 1 week ago - PNA   PUD Prophylaxis :    DVT Prophylaxis  :  Lovenox  Added BID  Lab Results  Component Value Date   PLT 218 10/02/2018    Inpatient Medications  Scheduled Meds: . dextromethorphan-guaiFENesin  1 tablet Oral BID  . methylPREDNISolone (SOLU-MEDROL) injection  60 mg Intravenous Q12H   Continuous Infusions: . remdesivir 100 mg in NS 250 mL     PRN Meds:.acetaminophen, chlorpheniramine-HYDROcodone  Antibiotics  :    Anti-infectives (From admission, onward)   Start     Dose/Rate Route Frequency Ordered Stop   10/02/18 2130  remdesivir 100 mg in sodium chloride 0.9 % 250 mL IVPB     100 mg 500 mL/hr over 30 Minutes Intravenous Every 24 hours 10/01/18 2045 10/06/18 2129   10/01/18 2130  remdesivir 200 mg in sodium chloride 0.9 % 250 mL IVPB     200 mg 500 mL/hr over 30 Minutes Intravenous Once 10/01/18 2045 10/01/18 2210   10/01/18 1100  cefTRIAXone (ROCEPHIN) 1 g in sodium chloride 0.9 % 100 mL IVPB     1 g 200 mL/hr over 30 Minutes Intravenous  Once 10/01/18 1050 10/01/18 1333   10/01/18 1100  azithromycin (ZITHROMAX) 500 mg in sodium chloride 0.9 % 250 mL IVPB     500 mg 250 mL/hr over 60 Minutes Intravenous  Once 10/01/18 1050 10/01/18 1547       Time Spent in minutes  30    Lala Lund M.D on 10/02/2018 at 9:21 AM  To page go to [email or web address] - password [facility]  [facility] -  Office  [phone]   See all Orders from today for further details    Objective:   Vitals:   10/02/18 0014 10/02/18 0143 10/02/18 0149 10/02/18 0435  BP: 124/78   114/63  Pulse: 87 96 78 79  Resp: (!) 29 (!) 28 (!) 30 (!) 31  Temp: 97.7 F (36.5 C)   98.3 F (36.8 C)  TempSrc: Oral   Oral  SpO2: (!) 88% (!) 79% 91% 94%  Weight:      Height:        Wt Readings from Last 3 Encounters:  10/01/18 132.5 kg  09/25/18 (!) 156.7 kg  07/05/17 116.2 kg     Intake/Output Summary (Last 24 hours) at 10/02/2018 0921 Last data filed at 10/02/2018 0435 Gross per 24 hour  Intake 1700 ml  Output 825 ml  Net 875 ml     Physical Exam  Awake Alert, Oriented X 3, No new F.N deficits, Normal affect [place].AT,PERRAL Supple Neck,No JVD, No cervical lymphadenopathy appriciated.  Symmetrical Chest wall movement, Good air movement bilaterally, CTAB RRR,No Gallops,Rubs  or new Murmurs, No Parasternal Heave +ve B.Sounds, Abd Soft, No tenderness, No organomegaly appriciated, No rebound - guarding or rigidity. No Cyanosis, Clubbing or edema, No new Rash or bruise     Data Review:    CBC Recent Labs  Lab 09/26/18 0415 10/01/18 0956 10/02/18 0430  WBC 8.0 8.1 3.3*  HGB 12.8 15.2* 13.0  HCT 41.5 49.0* 43.3  PLT 205 256 218  MCV 86.5 86.7 87.1  MCH 26.7 26.9 26.2  MCHC 30.8 31.0 30.0  RDW 14.6 14.9 14.8  LYMPHSABS 0.9 1.5  --   MONOABS 0.5 0.4  --   EOSABS 0.0 0.0  --   BASOSABS 0.0 0.0  --     Chemistries  Recent Labs  Lab 09/26/18 0415 10/01/18 0956 10/02/18 0430  NA 139 141 138  K 4.5 4.0 4.9  CL 107 100 101  CO2 _0 GLUCOSE 151* 112* 279*  BUN 14 22* 18  CREATININE 0.41* 0.73 0.44  CALCIUM 8.8* 9.0 8.7*  MG  --   --  2.4  AST 24 54* 35  ALT 28 115* 93*  ALKPHOS 73 65 56  BILITOT 0.2* 0.5 0.3    ------------------------------------------------------------------------------------------------------------------ Recent Labs    10/01/18 0956  TRIG 111    No results found for: HGBA1C ------------------------------------------------------------------------------------------------------------------ No results for input(s): TSH, T4TOTAL, T3FREE, THYROIDAB in the last 72 hours.  Invalid input(s): FREET3  Cardiac Enzymes No results for input(s): CKMB, TROPONINI, MYOGLOBIN in the last 168 hours.  Invalid input(s): CK ------------------------------------------------------------------------------------------------------------------    Component Value Date/Time   BNP 5.8 10/02/2018 0500    Micro Results Recent Results (from the past 240 hour(s))  SARS Coronavirus 2 (CEPHEID- Performed in [place] hospital lab), Hosp Order     Status: Abnormal   Collection Time: 09/24/18  9:10 PM   Specimen: Nasopharyngeal Swab  Result Value Ref Range Status   SARS Coronavirus 2 POSITIVE (A) NEGATIVE Final    Comment: RESULT CALLED TO, READ BACK BY AND VERIFIED WITH: B OSORIO RN 09/24/18 2236 JDW (NOTE) If result is NEGATIVE SARS-CoV-2 target nucleic acids are NOT DETECTED. The SARS-CoV-2 RNA is generally detectable in upper and lower  respiratory specimens during the acute phase of infection. The lowest  concentration of SARS-CoV-2 viral copies this assay can detect is 250  copies / mL. A negative result does not preclude SARS-CoV-2 infection  and should not be used as the sole basis for treatment or other  patient management decisions.  A negative result may occur with  improper specimen collection / handling, submission of specimen other  than nasopharyngeal swab, presence of viral mutation(s) within the  areas targeted by this assay, and inadequate number of viral copies  (<250 copies / mL). A negative result must be combined with clinical  observations, patient history, and  epidemiological information. If result is POSITIVE SARS-CoV-2 target nucleic acids are DETECTED. The SARS- CoV-2 RNA is generally detectable in upper and lower  respiratory specimens during the acute phase of infection.  Positive  results are indicative of active infection with SARS-CoV-2.  Clinical  correlation with patient history and other diagnostic information is  necessary to determine patient infection status.  Positive results do  not rule out bacterial infection or co-infection with other viruses. If result is PRESUMPTIVE POSTIVE SARS-CoV-2 nucleic acids MAY BE PRESENT.   A presumptive positive result was obtained on the submitted specimen  and confirmed on repeat testing.  While 2019 novel coronavirus  (SARS-CoV-2) nucleic  acids may be present in the submitted sample  additional confirmatory testing may be necessary for epidemiological  and / or clinical management purposes  to differentiate between  SARS-CoV-2 and other Sarbecovirus currently known to infect humans.  If clinically indicated additional testing with an alternate test  methodology [phone]) is advised . The SARS-CoV-2 RNA is generally  detectable in upper and lower respiratory specimens during the acute  phase of infection. The expected result is Negative. Fact Sheet for Patients:  StrictlyIdeas.no Fact Sheet for Healthcare Providers: BankingDealers.co.za This test is not yet approved or cleared by the [place] FDA and has been authorized for detection and/or diagnosis of SARS-CoV-2 by FDA under an Emergency Use Authorization (EUA).  This EUA will remain in effect (meaning this test can be used) for the duration of the COVID-19 declaration under Section 564(b)(1) of the Act, 21 U.S.C. section 360bbb-3(b)(1), unless the authorization is terminated or revoked sooner. Performed at [facility], [place] [address], [place], [place] [address]     Radiology  Reports Ct Angio Chest Pe W Or Wo Contrast  Result Date: 09/25/2018 CLINICAL DATA:  COVID-19 positive.  Suspected pulmonary embolism. EXAM: CT ANGIOGRAPHY CHEST WITH CONTRAST TECHNIQUE: Multidetector CT imaging of the chest was performed using the standard protocol during bolus administration of intravenous contrast. Multiplanar CT image reconstructions and MIPs were obtained to evaluate the vascular anatomy. CONTRAST:  161m OMNIPAQUE IOHEXOL 350 MG/ML SOLN COMPARISON:  None. FINDINGS: Cardiovascular: Satisfactory opacification of the pulmonary arteries to the segmental level. No evidence of pulmonary embolism. Normal heart size. No pericardial effusion. Mediastinum/Nodes: Negative for adenopathy or mass Lungs/Pleura: Airspace disease in the lower lobes with clustered micro nodular appearance, asymmetric to the left. The apically lungs are spared. No edema or effusion. Upper Abdomen: There could be hepatic steatosis, but limited by contrast timing. Musculoskeletal: No acute or aggressive finding Review of the MIP images confirms the above findings. IMPRESSION: 1. Bilateral lower lobe pneumonia. There is history of COVID-19 positivity, but this pattern is more nodular and dependent than I have seen with other COVID-19 cases, consider superimposed bacterial pneumonia or aspiration. 2. Negative for pulmonary embolism. Electronically Signed   By: JMonte FantasiaM.D.   On: 09/25/2018 06:51   Dg Chest Port 1 View  Result Date: 10/02/2018 CLINICAL DATA:  Shortness of breath, COVID-19 EXAM: PORTABLE CHEST 1 VIEW COMPARISON:  Portable exam 0736 hours compared to 10/01/2018 FINDINGS: Enlargement of cardiac silhouette. Stable mediastinal contours. Patchy airspace infiltrates bilaterally increased since previous exam consistent with pneumonia. No pleural effusion or pneumothorax. Bones unremarkable. IMPRESSION: Patchy BILATERAL airspace infiltrates consistent with pneumonia including atypical etiologies, increased since  previous exam. Electronically Signed   By: MLavonia DanaM.D.   On: 10/02/2018 07:44   Dg Chest Port 1 View  Result Date: 10/01/2018 CLINICAL DATA:  Pelvic positive. Worsening hypoxia. EXAM: PORTABLE CHEST 1 VIEW COMPARISON:  09/26/2018 FINDINGS: There are increased hazy infiltrates at both lung bases. Heart size and pulmonary vascularity remain normal. No bone abnormality. IMPRESSION: Increasing hazy infiltrates at both lung bases. Electronically Signed   By: JLorriane ShireM.D.   On: 10/01/2018 10:58   Dg Chest Port 1 View  Result Date: 09/26/2018 CLINICAL DATA:  COVID-19 viral infection, morbid obesity EXAM: PORTABLE CHEST 1 VIEW COMPARISON:  09/25/2018 FINDINGS: Stable cardiomegaly without edema or CHF. No effusion or pneumothorax. Minor basilar increased opacities which correlate with mild basilar patchy interstitial disease by CT compatible with viral pneumonia. No significant change in  aeration. Trachea midline. IMPRESSION: Stable cardiomegaly and minor basilar interstitial opacities. Electronically Signed   By: Jerilynn Mages.  Shick M.D.   On: 09/26/2018 11:40   Dg Chest Port 1 View  Result Date: 09/24/2018 CLINICAL DATA:  Cough and fever EXAM: PORTABLE CHEST 1 VIEW COMPARISON:  None. FINDINGS: Cardiac shadow is within normal limits. The lungs are clear. No bony abnormality is noted. IMPRESSION: No active disease. Electronically Signed   By: Inez Catalina M.D.   On: 09/24/2018 21:00

## 2018-10-03 LAB — GLUCOSE, CAPILLARY
Glucose-Capillary: 163 mg/dL — ABNORMAL HIGH (ref 70–99)
Glucose-Capillary: 318 mg/dL — ABNORMAL HIGH (ref 70–99)
Glucose-Capillary: 308 mg/dL — ABNORMAL HIGH (ref 70–99)
Glucose-Capillary: 169 mg/dL — ABNORMAL HIGH (ref 70–99)

## 2018-10-03 LAB — COMPREHENSIVE METABOLIC PANEL
ALT: 76 U/L — ABNORMAL HIGH (ref 0–44)
AST: 24 U/L (ref 15–41)
Albumin: 3.6 g/dL (ref 3.5–5.0)
Alkaline Phosphatase: 53 U/L (ref 38–126)
Anion gap: 12 (ref 5–15)
BUN: 23 mg/dL — ABNORMAL HIGH (ref 6–20)
CO2: 26 mmol/L (ref 22–32)
Calcium: 8.8 mg/dL — ABNORMAL LOW (ref 8.9–10.3)
Chloride: 99 mmol/L (ref 98–111)
Creatinine, Ser: 0.39 mg/dL — ABNORMAL LOW (ref 0.44–1.00)
GFR calc Af Amer: >60 mL/min (ref >60)
GFR calc non Af Amer: >60 mL/min (ref >60)
Glucose, Bld: 201 mg/dL — ABNORMAL HIGH (ref 70–99)
Potassium: 4.8 mmol/L (ref 3.5–5.1)
Sodium: 137 mmol/L (ref 135–145)
Total Bilirubin: 0.3 mg/dL (ref 0.3–1.2)
Total Protein: 7.3 g/dL (ref 6.5–8.1)

## 2018-10-03 LAB — CBC
HCT: 44.2 % (ref 36.0–46.0)
Hemoglobin: 13.4 g/dL (ref 12.0–15.0)
MCH: 26.6 pg (ref 26.0–34.0)
MCHC: 30.3 g/dL (ref 30.0–36.0)
MCV: 87.9 fL (ref 80.0–100.0)
Platelets: 262 10*3/uL (ref 150–400)
RBC: 5.03 MIL/uL (ref 3.87–5.11)
RDW: 14.8 % (ref 11.5–15.5)
WBC: 4.7 10*3/uL (ref 4.0–10.5)
nRBC: 0 % (ref 0.0–0.2)

## 2018-10-03 LAB — HEMOGLOBIN A1C
Hgb A1c MFr Bld: 6.9 % — ABNORMAL HIGH (ref 4.8–5.6)
Mean Plasma Glucose: 151 mg/dL

## 2018-10-03 LAB — MAGNESIUM: Magnesium: 2.3 mg/dL (ref 1.7–2.4)

## 2018-10-03 LAB — FERRITIN: Ferritin: 116 ng/mL (ref 11–307)

## 2018-10-03 LAB — D-DIMER, QUANTITATIVE: D-Dimer, Quant: 0.37 ug/mL-FEU (ref 0.00–0.50)

## 2018-10-03 LAB — PHOSPHORUS: Phosphorus: 4.3 mg/dL (ref 2.5–4.6)

## 2018-10-03 LAB — C-REACTIVE PROTEIN: CRP: 5.4 mg/dL — ABNORMAL HIGH (ref <1.0)

## 2018-10-03 NOTE — Plan of Care (Signed)
Enhancing communication and education for pt using the interpreter line. Pt's pain in chest has improved since hydrocodone cough syrup was started.

## 2018-10-03 NOTE — Progress Notes (Signed)
Inpatient Diabetes Program Recommendations  AACE/ADA: New Consensus Statement on Inpatient Glycemic Control (2015)  Target Ranges:  Prepandial:   less than 140 mg/dL      Peak postprandial:   less than 180 mg/dL (1-2 hours)      Critically ill patients:  140 - 180 mg/dL   Results for MAIYAH, GOYNE (MRN [id number]) as of 10/03/2018 13:10  Ref. Range 10/02/2018 12:08 10/02/2018 21:52 10/03/2018 07:46 10/03/2018 11:15  Glucose-Capillary Latest Ref Range: 70 - 99 mg/dL 294 (H) 257 (H) 163 (H) 318 (H)    Review of Glycemic Control  Diabetes history: None  Current orders for Inpatient glycemic control:  Novolog 0-15 units tid Novolog 0-5 units qhs  Inpatient Diabetes Program Recommendations:    Decadron 10 mg given yesterday now on IV Solumedrol 60 mg Q12 hours. Glucose 160's increased to 300's after meals and Solumedrol dose. May consider Novolog meal coverage while on steroids.  Thanks,  Tama Headings RN, MSN, BC-ADM Inpatient Diabetes Coordinator Team Pager [phone] (8a-5p)

## 2018-10-03 NOTE — Progress Notes (Signed)
PROGRESS NOTE                                                                                                                                                                                                             Patient Demographics:    Karen Barajas, is a 28 y.o. female, DOB - February 01, 1991, [id number]  Outpatient Primary MD for the patient is Patient, No Pcp Per    LOS - 2  Admit date - 10/01/2018    Chief Complaint  Patient presents with  . COVID  . Shortness of Breath       Brief Narrative  Karen Barajas is a 28 y.o. female with medical history significant morbid obesity, recently discharged from [facility] with diagnosis of COVID-19 PNA,  she presented with worsening shortness of breath and was readmitted for further care.   Subjective:   Patient in bed, appears comfortable, denies any headache, no fever, no chest pain or pressure, mildly improved shortness of breath , no abdominal pain. No focal weakness.    Assessment  & Plan :     1. Acute Hypoxic Resp. Failure due to Acute Covid 19 Viral Pneumonitis during the ongoing 2020 Covid 19 Pandemic - she needed 10Lit HF o2 upon admission, she has been placed on IV steroids also received IV Remdisvir along with Actemra on the day of admission.  Risks and benefits were explained to the patient by the admitting physician.  She is much improved, continue titrating down oxygen, advance activity, added flutter valve and I-S for pulmonary toiletry, encouraged to sit up in the daytime in chair and prone when in bed at night.    COVID-19 Labs  Recent Labs    10/01/18 0956 10/02/18 0500 10/03/18 0500  DDIMER  --  0.47 0.37  FERRITIN 117 118 116  LDH 278*  --   --   CRP 5.4* 7.8* 5.4*    Lab Results  Component Value Date   SARSCOV2NAA POSITIVE (A) 09/24/2018     Hepatic Function Latest Ref Rng & Units 10/03/2018 10/02/2018 10/01/2018   Total Protein 6.5 - 8.1 g/dL 7.3 7.4 8.2(H)  Albumin 3.5 - 5.0 g/dL 3.6 3.5 4.0  AST 15 - 41 U/L 24 35 54(H)  ALT 0 - 44 U/L 76(H) 93(H) 115(H)  Alk Phosphatase 38 - 126 U/L  53 56 65  Total Bilirubin 0.3 - 1.2 mg/dL 0.3 0.3 0.5        Component Value Date/Time   BNP 5.8 10/02/2018 0500      2.  Morbid obesity.  Follow with PCP for weight loss.  3.  Mild transaminitis due to combination of COVID-19 infection, Actemra and IV REMDESIVIR use.  Trend overall is improving and downtrending, continue to monitor intermittently .    Condition -  Guarded   Family Communication  :  None  Code Status :  Full  Diet :    Diet Order            Diet regular Room service appropriate? Yes; Fluid consistency: Thin  Diet effective now               Disposition Plan  :  Med  Consults  :  None  Procedures  :  CTA 1 week ago - PNA   PUD Prophylaxis :    DVT Prophylaxis  :  Lovenox  Added BID  Lab Results  Component Value Date   PLT 262 10/03/2018    Inpatient Medications  Scheduled Meds: . dextromethorphan-guaiFENesin  1 tablet Oral BID  . enoxaparin (LOVENOX) injection  40 mg Subcutaneous Q12H  . insulin aspart  0-15 Units Subcutaneous TID WC  . insulin aspart  0-5 Units Subcutaneous QHS  . methylPREDNISolone (SOLU-MEDROL) injection  60 mg Intravenous Q12H   Continuous Infusions: . remdesivir 100 mg in NS 250 mL Stopped (10/02/18 2049)   PRN Meds:.acetaminophen, chlorpheniramine-HYDROcodone, ondansetron (ZOFRAN) IV  Antibiotics  :    Anti-infectives (From admission, onward)   Start     Dose/Rate Route Frequency Ordered Stop   10/02/18 2130  remdesivir 100 mg in sodium chloride 0.9 % 250 mL IVPB     100 mg 500 mL/hr over 30 Minutes Intravenous Every 24 hours 10/01/18 2045 10/06/18 2129   10/01/18 2130  remdesivir 200 mg in sodium chloride 0.9 % 250 mL IVPB     200 mg 500 mL/hr over 30 Minutes Intravenous Once 10/01/18 2045 10/01/18 2210   10/01/18 1100  cefTRIAXone  (ROCEPHIN) 1 g in sodium chloride 0.9 % 100 mL IVPB     1 g 200 mL/hr over 30 Minutes Intravenous  Once 10/01/18 1050 10/01/18 1333   10/01/18 1100  azithromycin (ZITHROMAX) 500 mg in sodium chloride 0.9 % 250 mL IVPB     500 mg 250 mL/hr over 60 Minutes Intravenous  Once 10/01/18 1050 10/01/18 1547       Time Spent in minutes  30   Lala Lund M.D on 10/03/2018 at 11:04 AM  To page go to [email or web address] - password [facility]  [facility] -  Office  [phone]   See all Orders from today for further details    Objective:   Vitals:   10/03/18 0125 10/03/18 0126 10/03/18 0545 10/03/18 0747  BP: 107/73  108/72 108/68  Pulse: 62  80   Resp: (!) 25  (!) 31   Temp:  98.4 F (36.9 C) 98.7 F (37.1 C) 98.5 F (36.9 C)  TempSrc:  Oral Oral Oral  SpO2: 96%  91%   Weight:      Height:        Wt Readings from Last 3 Encounters:  10/01/18 132.5 kg  09/25/18 (!) 156.7 kg  07/05/17 116.2 kg     Intake/Output Summary (Last 24 hours) at 10/03/2018 1104 Last data filed at 10/03/2018 0825 Gross per 24 hour  Intake 970 ml  Output 750 ml  Net 220 ml     Physical Exam  Awake Alert, No new F.N deficits, Normal affect [place].AT,PERRAL Supple Neck,No JVD, No cervical lymphadenopathy appriciated.  Symmetrical Chest wall movement, Good air movement bilaterally, CTAB RRR,No Gallops, Rubs or new Murmurs, No Parasternal Heave +ve B.Sounds, Abd Soft, No tenderness, No organomegaly appriciated, No rebound - guarding or rigidity. No Cyanosis, Clubbing or edema, No new Rash or bruise    Data Review:    CBC Recent Labs  Lab 10/01/18 0956 10/02/18 0430 10/03/18 0500  WBC 8.1 3.3* 4.7  HGB 15.2* 13.0 13.4  HCT 49.0* 43.3 44.2  PLT 256 218 262  MCV 86.7 87.1 87.9  MCH 26.9 26.2 26.6  MCHC 31.0 30.0 30.3  RDW 14.9 14.8 14.8  LYMPHSABS 1.5  --   --   MONOABS 0.4  --   --   EOSABS 0.0  --   --   BASOSABS 0.0  --   --     Chemistries  Recent Labs  Lab 10/01/18 0956  10/02/18 0430 10/03/18 0500  NA 141 138 137  K 4.0 4.9 4.8  CL 100 101 99  CO2 _0 GLUCOSE 112* 279* 201*  BUN 22* 18 23*  CREATININE 0.73 0.44 0.39*  CALCIUM 9.0 8.7* 8.8*  MG  --  2.4 2.3  AST 54* 35 24  ALT 115* 93* 76*  ALKPHOS 65 56 53  BILITOT 0.5 0.3 0.3   ------------------------------------------------------------------------------------------------------------------ Recent Labs    10/01/18 0956  TRIG 111    Lab Results  Component Value Date   HGBA1C 6.9 (H) 10/02/2018   ------------------------------------------------------------------------------------------------------------------ No results for input(s): TSH, T4TOTAL, T3FREE, THYROIDAB in the last 72 hours.  Invalid input(s): FREET3  Cardiac Enzymes No results for input(s): CKMB, TROPONINI, MYOGLOBIN in the last 168 hours.  Invalid input(s): CK ------------------------------------------------------------------------------------------------------------------    Component Value Date/Time   BNP 5.8 10/02/2018 0500    Micro Results Recent Results (from the past 240 hour(s))  SARS Coronavirus 2 (CEPHEID- Performed in [facility] hospital lab), Hosp Order     Status: Abnormal   Collection Time: 09/24/18  9:10 PM   Specimen: Nasopharyngeal Swab  Result Value Ref Range Status   SARS Coronavirus 2 POSITIVE (A) NEGATIVE Final    Comment: RESULT CALLED TO, READ BACK BY AND VERIFIED WITH: B OSORIO RN 09/24/18 2236 JDW (NOTE) If result is NEGATIVE SARS-CoV-2 target nucleic acids are NOT DETECTED. The SARS-CoV-2 RNA is generally detectable in upper and lower  respiratory specimens during the acute phase of infection. The lowest  concentration of SARS-CoV-2 viral copies this assay can detect is 250  copies / mL. A negative result does not preclude SARS-CoV-2 infection  and should not be used as the sole basis for treatment or other  patient management decisions.  A negative result may occur with   improper specimen collection / handling, submission of specimen other  than nasopharyngeal swab, presence of viral mutation(s) within the  areas targeted by this assay, and inadequate number of viral copies  (<250 copies / mL). A negative result must be combined with clinical  observations, patient history, and epidemiological information. If result is POSITIVE SARS-CoV-2 target nucleic acids are DETECTED. The SARS- CoV-2 RNA is generally detectable in upper and lower  respiratory specimens during the acute phase of infection.  Positive  results are indicative of active infection with SARS-CoV-2.  Clinical  correlation with patient history and other diagnostic  information is  necessary to determine patient infection status.  Positive results do  not rule out bacterial infection or co-infection with other viruses. If result is PRESUMPTIVE POSTIVE SARS-CoV-2 nucleic acids MAY BE PRESENT.   A presumptive positive result was obtained on the submitted specimen  and confirmed on repeat testing.  While 2019 novel coronavirus  (SARS-CoV-2) nucleic acids may be present in the submitted sample  additional confirmatory testing may be necessary for epidemiological  and / or clinical management purposes  to differentiate between  SARS-CoV-2 and other Sarbecovirus currently known to infect humans.  If clinically indicated additional testing with an alternate test  methodology [phone]) is advised . The SARS-CoV-2 RNA is generally  detectable in upper and lower respiratory specimens during the acute  phase of infection. The expected result is Negative. Fact Sheet for Patients:  StrictlyIdeas.no Fact Sheet for Healthcare Providers: BankingDealers.co.za This test is not yet approved or cleared by the [place] FDA and has been authorized for detection and/or diagnosis of SARS-CoV-2 by FDA under an Emergency Use Authorization (EUA).  This EUA will  remain in effect (meaning this test can be used) for the duration of the COVID-19 declaration under Section 564(b)(1) of the Act, 21 U.S.C. section 360bbb-3(b)(1), unless the authorization is terminated or revoked sooner. Performed at [facility], [place] [address] [place], [place] [address]   Blood Culture (routine x 2)     Status: None (Preliminary result)   Collection Time: 10/01/18 12:30 PM   Specimen: BLOOD  Result Value Ref Range Status   Specimen Description   Final    BLOOD RIGHT ARM Performed at [facility] [address], [place], [place] [address]    Special Requests   Final    BOTTLES DRAWN AEROBIC AND ANAEROBIC Blood Culture adequate volume Performed at [facility] [address] [place], [place] [address]    Culture   Final    NO GROWTH < 24 HOURS Performed at [facility] [address], [place], [place] [address]    Report Status PENDING  Incomplete    Radiology Reports  Ct Angio Chest Pe W Or Wo Contrast  Result Date: 09/25/2018 CLINICAL DATA:  COVID-19 positive.  Suspected pulmonary embolism. EXAM: CT ANGIOGRAPHY CHEST WITH CONTRAST TECHNIQUE: Multidetector CT imaging of the chest was performed using the standard protocol during bolus administration of intravenous contrast. Multiplanar CT image reconstructions and MIPs were obtained to evaluate the vascular anatomy. CONTRAST:  113m OMNIPAQUE IOHEXOL 350 MG/ML SOLN COMPARISON:  None. FINDINGS: Cardiovascular: Satisfactory opacification of the pulmonary arteries to the segmental level. No evidence of pulmonary embolism. Normal heart size. No pericardial effusion. Mediastinum/Nodes: Negative for adenopathy or mass Lungs/Pleura: Airspace disease in the lower lobes with clustered micro nodular appearance, asymmetric to the left. The apically lungs are spared. No edema or effusion. Upper Abdomen: There could be hepatic steatosis, but limited by contrast timing.  Musculoskeletal: No acute or aggressive finding Review of the MIP images confirms the above findings. IMPRESSION: 1. Bilateral lower lobe pneumonia. There is history of COVID-19 positivity, but this pattern is more nodular and dependent than I have seen with other COVID-19 cases, consider superimposed bacterial pneumonia or aspiration. 2. Negative for pulmonary embolism. Electronically Signed   By: JMonte FantasiaM.D.   On: 09/25/2018 06:51   Dg Chest Port 1 View  Result Date: 10/02/2018 CLINICAL DATA:  Shortness of breath, COVID-19 EXAM: PORTABLE CHEST 1 VIEW COMPARISON:  Portable exam  0736 hours compared to 10/01/2018 FINDINGS: Enlargement of cardiac silhouette. Stable mediastinal contours. Patchy airspace infiltrates bilaterally increased since previous exam consistent with pneumonia. No pleural effusion or pneumothorax. Bones unremarkable. IMPRESSION: Patchy BILATERAL airspace infiltrates consistent with pneumonia including atypical etiologies, increased since previous exam. Electronically Signed   By: Lavonia Dana M.D.   On: 10/02/2018 07:44   Dg Chest Port 1 View  Result Date: 10/01/2018 CLINICAL DATA:  Pelvic positive. Worsening hypoxia. EXAM: PORTABLE CHEST 1 VIEW COMPARISON:  09/26/2018 FINDINGS: There are increased hazy infiltrates at both lung bases. Heart size and pulmonary vascularity remain normal. No bone abnormality. IMPRESSION: Increasing hazy infiltrates at both lung bases. Electronically Signed   By: Lorriane Shire M.D.   On: 10/01/2018 10:58   Dg Chest Port 1 View  Result Date: 09/26/2018 CLINICAL DATA:  COVID-19 viral infection, morbid obesity EXAM: PORTABLE CHEST 1 VIEW COMPARISON:  09/25/2018 FINDINGS: Stable cardiomegaly without edema or CHF. No effusion or pneumothorax. Minor basilar increased opacities which correlate with mild basilar patchy interstitial disease by CT compatible with viral pneumonia. No significant change in aeration. Trachea midline. IMPRESSION: Stable  cardiomegaly and minor basilar interstitial opacities. Electronically Signed   By: Jerilynn Mages.  Shick M.D.   On: 09/26/2018 11:40   Dg Chest Port 1 View  Result Date: 09/24/2018 CLINICAL DATA:  Cough and fever EXAM: PORTABLE CHEST 1 VIEW COMPARISON:  None. FINDINGS: Cardiac shadow is within normal limits. The lungs are clear. No bony abnormality is noted. IMPRESSION: No active disease. Electronically Signed   By: Inez Catalina M.D.   On: 09/24/2018 21:00

## 2018-10-03 NOTE — Progress Notes (Signed)
Alert and oriented x 4. Afebrile. Out of bed to recliner with no assistance. Dyspnea on exertion during position changes but at rest O2 SAT 94-96%. On O2 via nasal canula at 6 liters. Continues on Remdesivir and Soludmedrol with no adverse reactions. Call bell within reach. Encouraged to report signs and symptoms to staff.

## 2018-10-04 LAB — GLUCOSE, CAPILLARY
Glucose-Capillary: 158 mg/dL — ABNORMAL HIGH (ref 70–99)
Glucose-Capillary: 334 mg/dL — ABNORMAL HIGH (ref 70–99)
Glucose-Capillary: 245 mg/dL — ABNORMAL HIGH (ref 70–99)
Glucose-Capillary: 237 mg/dL — ABNORMAL HIGH (ref 70–99)

## 2018-10-04 LAB — PHOSPHORUS: Phosphorus: 5.3 mg/dL — ABNORMAL HIGH (ref 2.5–4.6)

## 2018-10-04 LAB — COMPREHENSIVE METABOLIC PANEL
ALT: 77 U/L — ABNORMAL HIGH (ref 0–44)
AST: 26 U/L (ref 15–41)
Albumin: 3.4 g/dL — ABNORMAL LOW (ref 3.5–5.0)
Alkaline Phosphatase: 52 U/L (ref 38–126)
Anion gap: 11 (ref 5–15)
BUN: 24 mg/dL — ABNORMAL HIGH (ref 6–20)
CO2: 28 mmol/L (ref 22–32)
Calcium: 8.6 mg/dL — ABNORMAL LOW (ref 8.9–10.3)
Chloride: 101 mmol/L (ref 98–111)
Creatinine, Ser: 0.53 mg/dL (ref 0.44–1.00)
GFR calc Af Amer: >60 mL/min (ref >60)
GFR calc non Af Amer: >60 mL/min (ref >60)
Glucose, Bld: 213 mg/dL — ABNORMAL HIGH (ref 70–99)
Potassium: 4.5 mmol/L (ref 3.5–5.1)
Sodium: 140 mmol/L (ref 135–145)
Total Bilirubin: 0.2 mg/dL — ABNORMAL LOW (ref 0.3–1.2)
Total Protein: 6.7 g/dL (ref 6.5–8.1)

## 2018-10-04 LAB — CBC
HCT: 43.6 % (ref 36.0–46.0)
Hemoglobin: 13.6 g/dL (ref 12.0–15.0)
MCH: 27 pg (ref 26.0–34.0)
MCHC: 31.2 g/dL (ref 30.0–36.0)
MCV: 86.7 fL (ref 80.0–100.0)
Platelets: 282 10*3/uL (ref 150–400)
RBC: 5.03 MIL/uL (ref 3.87–5.11)
RDW: 14.3 % (ref 11.5–15.5)
WBC: 4.7 10*3/uL (ref 4.0–10.5)
nRBC: 0 % (ref 0.0–0.2)

## 2018-10-04 LAB — MAGNESIUM: Magnesium: 2.2 mg/dL (ref 1.7–2.4)

## 2018-10-04 LAB — C-REACTIVE PROTEIN: CRP: 2.6 mg/dL — ABNORMAL HIGH (ref <1.0)

## 2018-10-04 LAB — FERRITIN: Ferritin: 98 ng/mL (ref 11–307)

## 2018-10-04 LAB — D-DIMER, QUANTITATIVE: D-Dimer, Quant: 0.41 ug/mL-FEU (ref 0.00–0.50)

## 2018-10-04 NOTE — Progress Notes (Signed)
PROGRESS NOTE                                                                                                                                                                                                             Patient Demographics:    Karen Barajas, is a 28 y.o. female, DOB - 1990/07/10, EOF:[id number]  Outpatient Primary MD for the patient is Patient, No Pcp Per    LOS - 3  Admit date - 10/01/2018    Chief Complaint  Patient presents with  . COVID  . Shortness of Breath       Brief Narrative  Karen Barajas is a 28 y.o. female with medical history significant morbid obesity, recently discharged from [facility] with diagnosis of COVID-19 PNA,  she presented with worsening shortness of breath and was readmitted for further care.   Subjective:   Patient in bed, appears comfortable, denies any headache, no fever, no chest pain or pressure, improved shortness of breath , no abdominal pain. No focal weakness.   Assessment  & Plan :     1. Acute Hypoxic Resp. Failure due to Acute Covid 19 Viral Pneumonitis during the ongoing 2020 Covid 19 Pandemic - she needed 10Lit HF o2 upon admission, she has been placed on IV steroids also received IV Remdisvir along with Actemra on the day of admission.  Risks and benefits were explained to the patient by the admitting physician.  She is much improved, continue titrating down oxygen, advance activity, added flutter valve and I-S for pulmonary toiletry, encouraged to sit up in the daytime in chair and prone when in bed at night.    COVID-19 Labs  Recent Labs    10/02/18 0500 10/03/18 0500 10/04/18 0445  DDIMER 0.47 0.37 0.41  FERRITIN 118 116 98  CRP 7.8* 5.4* 2.6*    Lab Results  Component Value Date   SARSCOV2NAA POSITIVE (A) 09/24/2018     Hepatic Function Latest Ref Rng & Units 10/04/2018 10/03/2018 10/02/2018  Total Protein 6.5 - 8.1 g/dL 6.7  7.3 7.4  Albumin 3.5 - 5.0 g/dL 3.4(L) 3.6 3.5  AST 15 - 41 U/L 26 24 35  ALT 0 - 44 U/L 77(H) 76(H) 93(H)  Alk Phosphatase 38 - 126 U/L 52 53 56  Total Bilirubin 0.3 - 1.2 mg/dL 0.2(L) 0.3 0.3  Component Value Date/Time   BNP 5.8 10/02/2018 0500     2.  Morbid obesity.  Follow with PCP for weight loss.  3.  Mild transaminitis due to combination of COVID-19 infection, Actemra and IV REMDESIVIR use.  Trend overall is improving and downtrending, continue to monitor intermittently .     Condition -  Guarded   Family Communication  :  None  Code Status :  Full  Diet :    Diet Order            Diet regular Room service appropriate? Yes; Fluid consistency: Thin  Diet effective now               Disposition Plan  :  Med  Consults  :  None  Procedures  :  CTA 1 week ago - PNA   PUD Prophylaxis :    DVT Prophylaxis  :  Lovenox  Added BID  Lab Results  Component Value Date   PLT 282 10/04/2018    Inpatient Medications  Scheduled Meds: . dextromethorphan-guaiFENesin  1 tablet Oral BID  . enoxaparin (LOVENOX) injection  40 mg Subcutaneous Q12H  . insulin aspart  0-15 Units Subcutaneous TID WC  . insulin aspart  0-5 Units Subcutaneous QHS  . methylPREDNISolone (SOLU-MEDROL) injection  60 mg Intravenous Q12H   Continuous Infusions: . remdesivir 100 mg in NS 250 mL 100 mg (10/03/18 2137)   PRN Meds:.acetaminophen, chlorpheniramine-HYDROcodone, ondansetron (ZOFRAN) IV  Antibiotics  :    Anti-infectives (From admission, onward)   Start     Dose/Rate Route Frequency Ordered Stop   10/02/18 2130  remdesivir 100 mg in sodium chloride 0.9 % 250 mL IVPB     100 mg 500 mL/hr over 30 Minutes Intravenous Every 24 hours 10/01/18 2045 10/06/18 2129   10/01/18 2130  remdesivir 200 mg in sodium chloride 0.9 % 250 mL IVPB     200 mg 500 mL/hr over 30 Minutes Intravenous Once 10/01/18 2045 10/01/18 2210   10/01/18 1100  cefTRIAXone (ROCEPHIN) 1 g in sodium chloride  0.9 % 100 mL IVPB     1 g 200 mL/hr over 30 Minutes Intravenous  Once 10/01/18 1050 10/01/18 1333   10/01/18 1100  azithromycin (ZITHROMAX) 500 mg in sodium chloride 0.9 % 250 mL IVPB     500 mg 250 mL/hr over 60 Minutes Intravenous  Once 10/01/18 1050 10/01/18 1547       Time Spent in minutes  30   Lala Lund M.D on 10/04/2018 at 11:47 AM  To page go to [email or web address] - password [facility]  [facility] -  Office  [phone]   See all Orders from today for further details    Objective:   Vitals:   10/03/18 1207 10/03/18 2000 10/04/18 0357 10/04/18 0829  BP: (!) 96/54 98/65 112/74 93/63  Pulse:  73 80 67  Resp:  (!) 23 19 (!) 26  Temp: 98.5 F (36.9 C) 99 F (37.2 C) 97.9 F (36.6 C) 98.3 F (36.8 C)  TempSrc:  Oral Oral Oral  SpO2:  90% 95% 93%  Weight:      Height:        Wt Readings from Last 3 Encounters:  10/01/18 132.5 kg  09/25/18 (!) 156.7 kg  07/05/17 116.2 kg     Intake/Output Summary (Last 24 hours) at 10/04/2018 1147 Last data filed at 10/04/2018 1100 Gross per 24 hour  Intake 680 ml  Output 200 ml  Net 480 ml  Physical Exam  Awake Alert, Oriented X 3, No new F.N deficits, Normal affect Joshua.AT,PERRAL Supple Neck,No JVD, No cervical lymphadenopathy appriciated.  Symmetrical Chest wall movement, Good air movement bilaterally, CTAB RRR,No Gallops, Rubs or new Murmurs, No Parasternal Heave +ve B.Sounds, Abd Soft, No tenderness, No organomegaly appriciated, No rebound - guarding or rigidity. No Cyanosis, Clubbing or edema, No new Rash or bruise    Data Review:    CBC Recent Labs  Lab 10/01/18 0956 10/02/18 0430 10/03/18 0500 10/04/18 0445  WBC 8.1 3.3* 4.7 4.7  HGB 15.2* 13.0 13.4 13.6  HCT 49.0* 43.3 44.2 43.6  PLT 256 218 262 282  MCV 86.7 87.1 87.9 86.7  MCH 26.9 26.2 26.6 27.0  MCHC 31.0 30.0 30.3 31.2  RDW 14.9 14.8 14.8 14.3  LYMPHSABS 1.5  --   --   --   MONOABS 0.4  --   --   --   EOSABS 0.0  --   --   --    BASOSABS 0.0  --   --   --     Chemistries  Recent Labs  Lab 10/01/18 0956 10/02/18 0430 10/03/18 0500 10/04/18 0445  NA 141 138 137 140  K 4.0 4.9 4.8 4.5  CL 100 101 99 101  CO2 28 26 26 28   GLUCOSE 112* 279* 201* 213*  BUN 22* 18 23* 24*  CREATININE 0.73 0.44 0.39* 0.53  CALCIUM 9.0 8.7* 8.8* 8.6*  MG  --  2.4 2.3 2.2  AST 54* 35 24 26  ALT 115* 93* 76* 77*  ALKPHOS 65 56 53 52  BILITOT 0.5 0.3 0.3 0.2*   ------------------------------------------------------------------------------------------------------------------ No results for input(s): CHOL, HDL, LDLCALC, TRIG, CHOLHDL, LDLDIRECT in the last 72 hours.  Lab Results  Component Value Date   HGBA1C 6.9 (H) 10/02/2018   ------------------------------------------------------------------------------------------------------------------ No results for input(s): TSH, T4TOTAL, T3FREE, THYROIDAB in the last 72 hours.  Invalid input(s): FREET3  Cardiac Enzymes No results for input(s): CKMB, TROPONINI, MYOGLOBIN in the last 168 hours.  Invalid input(s): CK ------------------------------------------------------------------------------------------------------------------    Component Value Date/Time   BNP 5.8 10/02/2018 0500    Micro Results Recent Results (from the past 240 hour(s))  SARS Coronavirus 2 (CEPHEID- Performed in [facility] hospital lab), Hosp Order     Status: Abnormal   Collection Time: 09/24/18  9:10 PM   Specimen: Nasopharyngeal Swab  Result Value Ref Range Status   SARS Coronavirus 2 POSITIVE (A) NEGATIVE Final    Comment: RESULT CALLED TO, READ BACK BY AND VERIFIED WITH: B OSORIO RN 09/24/18 2236 JDW (NOTE) If result is NEGATIVE SARS-CoV-2 target nucleic acids are NOT DETECTED. The SARS-CoV-2 RNA is generally detectable in upper and lower  respiratory specimens during the acute phase of infection. The lowest  concentration of SARS-CoV-2 viral copies this assay can detect is 250  copies / mL. A  negative result does not preclude SARS-CoV-2 infection  and should not be used as the sole basis for treatment or other  patient management decisions.  A negative result may occur with  improper specimen collection / handling, submission of specimen other  than nasopharyngeal swab, presence of viral mutation(s) within the  areas targeted by this assay, and inadequate number of viral copies  (<250 copies / mL). A negative result must be combined with clinical  observations, patient history, and epidemiological information. If result is POSITIVE SARS-CoV-2 target nucleic acids are DETECTED. The SARS- CoV-2 RNA is generally detectable in upper and lower  respiratory  specimens during the acute phase of infection.  Positive  results are indicative of active infection with SARS-CoV-2.  Clinical  correlation with patient history and other diagnostic information is  necessary to determine patient infection status.  Positive results do  not rule out bacterial infection or co-infection with other viruses. If result is PRESUMPTIVE POSTIVE SARS-CoV-2 nucleic acids MAY BE PRESENT.   A presumptive positive result was obtained on the submitted specimen  and confirmed on repeat testing.  While 2019 novel coronavirus  (SARS-CoV-2) nucleic acids may be present in the submitted sample  additional confirmatory testing may be necessary for epidemiological  and / or clinical management purposes  to differentiate between  SARS-CoV-2 and other Sarbecovirus currently known to infect humans.  If clinically indicated additional testing with an alternate test  methodology [phone]) is advised . The SARS-CoV-2 RNA is generally  detectable in upper and lower respiratory specimens during the acute  phase of infection. The expected result is Negative. Fact Sheet for Patients:  StrictlyIdeas.no Fact Sheet for Healthcare Providers: BankingDealers.co.za This test is not  yet approved or cleared by the [place] FDA and has been authorized for detection and/or diagnosis of SARS-CoV-2 by FDA under an Emergency Use Authorization (EUA).  This EUA will remain in effect (meaning this test can be used) for the duration of the COVID-19 declaration under Section 564(b)(1) of the Act, 21 U.S.C. section 360bbb-3(b)(1), unless the authorization is terminated or revoked sooner. Performed at [facility], [place] [address] [place], [place] [address]   Blood Culture (routine x 2)     Status: None (Preliminary result)   Collection Time: 10/01/18 12:30 PM   Specimen: BLOOD  Result Value Ref Range Status   Specimen Description   Final    BLOOD RIGHT ARM Performed at [facility] [address], [place], [place] [address]    Special Requests   Final    BOTTLES DRAWN AEROBIC AND ANAEROBIC Blood Culture adequate volume Performed at [facility] [address], [place], [place] [address]    Culture   Final    NO GROWTH 2 DAYS Performed at [facility] [address] [place], [place] [address]    Report Status PENDING  Incomplete    Radiology Reports  Ct Angio Chest Pe W Or Wo Contrast  Result Date: 09/25/2018 CLINICAL DATA:  COVID-19 positive.  Suspected pulmonary embolism. EXAM: CT ANGIOGRAPHY CHEST WITH CONTRAST TECHNIQUE: Multidetector CT imaging of the chest was performed using the standard protocol during bolus administration of intravenous contrast. Multiplanar CT image reconstructions and MIPs were obtained to evaluate the vascular anatomy. CONTRAST:  131m OMNIPAQUE IOHEXOL 350 MG/ML SOLN COMPARISON:  None. FINDINGS: Cardiovascular: Satisfactory opacification of the pulmonary arteries to the segmental level. No evidence of pulmonary embolism. Normal heart size. No pericardial effusion. Mediastinum/Nodes: Negative for adenopathy or mass Lungs/Pleura: Airspace disease in the lower lobes with clustered micro  nodular appearance, asymmetric to the left. The apically lungs are spared. No edema or effusion. Upper Abdomen: There could be hepatic steatosis, but limited by contrast timing. Musculoskeletal: No acute or aggressive finding Review of the MIP images confirms the above findings. IMPRESSION: 1. Bilateral lower lobe pneumonia. There is history of COVID-19 positivity, but this pattern is more nodular and dependent than I have seen with other COVID-19 cases, consider superimposed bacterial pneumonia or aspiration. 2. Negative for pulmonary embolism. Electronically Signed   By: JMonte FantasiaM.D.   On: 09/25/2018 06:51  Dg Chest Port 1 View  Result Date: 10/02/2018 CLINICAL DATA:  Shortness of breath, COVID-19 EXAM: PORTABLE CHEST 1 VIEW COMPARISON:  Portable exam 0736 hours compared to 10/01/2018 FINDINGS: Enlargement of cardiac silhouette. Stable mediastinal contours. Patchy airspace infiltrates bilaterally increased since previous exam consistent with pneumonia. No pleural effusion or pneumothorax. Bones unremarkable. IMPRESSION: Patchy BILATERAL airspace infiltrates consistent with pneumonia including atypical etiologies, increased since previous exam. Electronically Signed   By: Lavonia Dana M.D.   On: 10/02/2018 07:44   Dg Chest Port 1 View  Result Date: 10/01/2018 CLINICAL DATA:  Pelvic positive. Worsening hypoxia. EXAM: PORTABLE CHEST 1 VIEW COMPARISON:  09/26/2018 FINDINGS: There are increased hazy infiltrates at both lung bases. Heart size and pulmonary vascularity remain normal. No bone abnormality. IMPRESSION: Increasing hazy infiltrates at both lung bases. Electronically Signed   By: Lorriane Shire M.D.   On: 10/01/2018 10:58   Dg Chest Port 1 View  Result Date: 09/26/2018 CLINICAL DATA:  COVID-19 viral infection, morbid obesity EXAM: PORTABLE CHEST 1 VIEW COMPARISON:  09/25/2018 FINDINGS: Stable cardiomegaly without edema or CHF. No effusion or pneumothorax. Minor basilar increased opacities  which correlate with mild basilar patchy interstitial disease by CT compatible with viral pneumonia. No significant change in aeration. Trachea midline. IMPRESSION: Stable cardiomegaly and minor basilar interstitial opacities. Electronically Signed   By: Jerilynn Mages.  Shick M.D.   On: 09/26/2018 11:40   Dg Chest Port 1 View  Result Date: 09/24/2018 CLINICAL DATA:  Cough and fever EXAM: PORTABLE CHEST 1 VIEW COMPARISON:  None. FINDINGS: Cardiac shadow is within normal limits. The lungs are clear. No bony abnormality is noted. IMPRESSION: No active disease. Electronically Signed   By: Inez Catalina M.D.   On: 09/24/2018 21:00

## 2018-10-04 NOTE — Progress Notes (Signed)
Sign and held orders pending from 10/01/18 including actemra. Notified Dr. Candiss Norse. Made aware of pending orders.

## 2018-10-04 NOTE — Progress Notes (Signed)
Discussed with patient importance of proning or side lying to increase oxygen saturation. Stated understanding. Worked throughout shift on positioning. Breathes best on left side.

## 2018-10-04 NOTE — Progress Notes (Signed)
[id number] interrupter used to for assessment and education. PT transferred to chair and educated on how and when to use flutter valve. PT returned demonstration.

## 2018-10-05 LAB — CBC
HCT: 43.1 % (ref 36.0–46.0)
Hemoglobin: 13.1 g/dL (ref 12.0–15.0)
MCH: 26.1 pg (ref 26.0–34.0)
MCHC: 30.4 g/dL (ref 30.0–36.0)
MCV: 86 fL (ref 80.0–100.0)
Platelets: 266 10*3/uL (ref 150–400)
RBC: 5.01 MIL/uL (ref 3.87–5.11)
RDW: 14.1 % (ref 11.5–15.5)
WBC: 4.6 10*3/uL (ref 4.0–10.5)
nRBC: 0 % (ref 0.0–0.2)

## 2018-10-05 LAB — FERRITIN: Ferritin: 91 ng/mL (ref 11–307)

## 2018-10-05 LAB — GLUCOSE, CAPILLARY
Glucose-Capillary: 189 mg/dL — ABNORMAL HIGH (ref 70–99)
Glucose-Capillary: 194 mg/dL — ABNORMAL HIGH (ref 70–99)
Glucose-Capillary: 263 mg/dL — ABNORMAL HIGH (ref 70–99)
Glucose-Capillary: 292 mg/dL — ABNORMAL HIGH (ref 70–99)
Glucose-Capillary: 305 mg/dL — ABNORMAL HIGH (ref 70–99)
Glucose-Capillary: 246 mg/dL — ABNORMAL HIGH (ref 70–99)

## 2018-10-05 LAB — MAGNESIUM: Magnesium: 2.3 mg/dL (ref 1.7–2.4)

## 2018-10-05 LAB — COMPREHENSIVE METABOLIC PANEL
ALT: 62 U/L — ABNORMAL HIGH (ref 0–44)
AST: 27 U/L (ref 15–41)
Albumin: 3.5 g/dL (ref 3.5–5.0)
Alkaline Phosphatase: 63 U/L (ref 38–126)
Anion gap: 21 — ABNORMAL HIGH (ref 5–15)
BUN: 27 mg/dL — ABNORMAL HIGH (ref 6–20)
CO2: 15 mmol/L — ABNORMAL LOW (ref 22–32)
Calcium: 8.8 mg/dL — ABNORMAL LOW (ref 8.9–10.3)
Chloride: 107 mmol/L (ref 98–111)
Creatinine, Ser: 0.51 mg/dL (ref 0.44–1.00)
GFR calc Af Amer: >60 mL/min (ref >60)
GFR calc non Af Amer: >60 mL/min (ref >60)
Glucose, Bld: 226 mg/dL — ABNORMAL HIGH (ref 70–99)
Potassium: 5.9 mmol/L — ABNORMAL HIGH (ref 3.5–5.1)
Sodium: 143 mmol/L (ref 135–145)
Total Bilirubin: 0.4 mg/dL (ref 0.3–1.2)
Total Protein: 6.9 g/dL (ref 6.5–8.1)

## 2018-10-05 LAB — D-DIMER, QUANTITATIVE: D-Dimer, Quant: 0.39 ug/mL-FEU (ref 0.00–0.50)

## 2018-10-05 LAB — C-REACTIVE PROTEIN: CRP: 1.4 mg/dL — ABNORMAL HIGH (ref <1.0)

## 2018-10-05 LAB — PHOSPHORUS: Phosphorus: 4.4 mg/dL (ref 2.5–4.6)

## 2018-10-05 NOTE — Progress Notes (Signed)
                                  PROGRESS NOTE                                                                                                                                                                                                             Patient Demographics:    Karen Barajas, is a 28 y.o. female, DOB - 11/26/1990, MRN:[id number]  Outpatient Primary MD for the patient is Patient, No Pcp Per    LOS - 4  Admit date - 10/01/2018    Chief Complaint  Patient presents with  . COVID  . Shortness of Breath       Brief Narrative  Karen Barajas is a 28 y.o. female with medical history significant morbid obesity, recently discharged from [facility] with diagnosis of COVID-19 PNA,  she presented with worsening shortness of breath and was readmitted for further care.   Subjective:   Patient in bed, appears comfortable, denies any headache, no fever, no chest pain or pressure, improving shortness of breath , no abdominal pain. No focal weakness.   Assessment  & Plan :     1. Acute Hypoxic Resp. Failure due to Acute Covid 19 Viral Pneumonitis during the ongoing 2020 Covid 19 Pandemic - she needed 10Lit HF o2 upon admission, she has been placed on IV steroids also received IV Remdisvir along with Actemra on the day of admission.  Risks and benefits were explained to the patient by the admitting physician.  She is much improved, continue titrating down oxygen, advance activity, added flutter valve and I-S for pulmonary toiletry, encouraged to sit up in the daytime in chair and prone when in bed at night.    COVID-19 Labs  Recent Labs    10/03/18 0500 10/04/18 0445 10/05/18 0257  DDIMER 0.37 0.41 0.39  FERRITIN 116 98 91  CRP 5.4* 2.6* 1.4*    Lab Results  Component Value Date   SARSCOV2NAA POSITIVE (A) 09/24/2018     Hepatic Function Latest Ref Rng & Units 10/04/2018 10/03/2018 10/02/2018  Total Protein 6.5 - 8.1 g/dL 6.7  7.3 7.4  Albumin 3.5 - 5.0 g/dL 3.4(L) 3.6 3.5  AST 15 - 41 U/L 26 24 35  ALT 0 - 44 U/L 77(H) 76(H) 93(H)  Alk Phosphatase 38 - 126 U/L 52 53 56  Total Bilirubin 0.3 - 1.2 mg/dL 0.2(L) 0.3 0.3          Component Value Date/Time   BNP 5.8 10/02/2018 0500     2.  Morbid obesity.  Follow with PCP for weight loss.  3.  Mild transaminitis due to combination of COVID-19 infection, Actemra and IV REMDESIVIR use.  Trend overall is improving and downtrending, continue to monitor intermittently thereafter PCP to check 7 to 10 days post discharge.     Condition -  Guarded   Family Communication  :  None  Code Status :  Full  Diet :    Diet Order            Diet regular Room service appropriate? Yes; Fluid consistency: Thin  Diet effective now               Disposition Plan  : Home likely in 2 to 3 days depending on clinical improvement.  Most likely will require short-term home oxygen.  Consults  :  None  Procedures  :  CTA 1 week ago - PNA   PUD Prophylaxis :    DVT Prophylaxis  :  Lovenox  BID  Lab Results  Component Value Date   PLT 266 10/05/2018    Inpatient Medications  Scheduled Meds: . dextromethorphan-guaiFENesin  1 tablet Oral BID  . enoxaparin (LOVENOX) injection  40 mg Subcutaneous Q12H  . insulin aspart  0-15 Units Subcutaneous TID WC  . insulin aspart  0-5 Units Subcutaneous QHS  . methylPREDNISolone (SOLU-MEDROL) injection  60 mg Intravenous Q12H   Continuous Infusions: . remdesivir 100 mg in NS 250 mL 100 mg (10/04/18 2212)   PRN Meds:.acetaminophen, chlorpheniramine-HYDROcodone, ondansetron (ZOFRAN) IV  Antibiotics  :    Anti-infectives (From admission, onward)   Start     Dose/Rate Route Frequency Ordered Stop   10/02/18 2130  remdesivir 100 mg in sodium chloride 0.9 % 250 mL IVPB     100 mg 500 mL/hr over 30 Minutes Intravenous Every 24 hours 10/01/18 2045 10/06/18 2129   10/01/18 2130  remdesivir 200 mg in sodium chloride 0.9 % 250 mL  IVPB     200 mg 500 mL/hr over 30 Minutes Intravenous Once 10/01/18 2045 10/01/18 2210   10/01/18 1100  cefTRIAXone (ROCEPHIN) 1 g in sodium chloride 0.9 % 100 mL IVPB     1 g 200 mL/hr over 30 Minutes Intravenous  Once 10/01/18 1050 10/01/18 1333   10/01/18 1100  azithromycin (ZITHROMAX) 500 mg in sodium chloride 0.9 % 250 mL IVPB     500 mg 250 mL/hr over 60 Minutes Intravenous  Once 10/01/18 1050 10/01/18 1547       Time Spent in minutes  30   Lala Lund M.D on 10/05/2018 at 9:09 AM  To page go to [email or web address] - password [facility]  [facility] -  Office  [phone]   See all Orders from today for further details    Objective:   Vitals:   10/04/18 2000 10/05/18 0015 10/05/18 0350 10/05/18 0800  BP: 90/67   (!) 95/53  Pulse: 69   69  Resp: (!) 28   (!) 28  Temp: 98.6 F (37 C) 98.3 F (36.8 C) 98.3 F (36.8 C) (!) 97.4 F (36.3 C)  TempSrc: Oral Oral Oral Oral  SpO2: 96%   95%  Weight:      Height:        Wt Readings from Last 3 Encounters:  10/01/18 132.5 kg  09/25/18 (!) 156.7 kg  07/05/17 116.2 kg     Intake/Output Summary (Last 24 hours) at 10/05/2018  6837 Last data filed at 10/05/2018 0350 Gross per 24 hour  Intake 240 ml  Output 550 ml  Net -310 ml     Physical Exam  Awake Alert, Oriented X 3, No new F.N deficits, Normal affect [place].AT,PERRAL Supple Neck,No JVD, No cervical lymphadenopathy appriciated.  Symmetrical Chest wall movement, Good air movement bilaterally, CTAB RRR,No Gallops, Rubs or new Murmurs, No Parasternal Heave +ve B.Sounds, Abd Soft, No tenderness, No organomegaly appriciated, No rebound - guarding or rigidity. No Cyanosis, Clubbing or edema, No new Rash or bruise    Data Review:    CBC Recent Labs  Lab 10/01/18 0956 10/02/18 0430 10/03/18 0500 10/04/18 0445 10/05/18 0520  WBC 8.1 3.3* 4.7 4.7 4.6  HGB 15.2* 13.0 13.4 13.6 13.1  HCT 49.0* 43.3 44.2 43.6 43.1  PLT 256 218 262 282 266  MCV 86.7 87.1 87.9  86.7 86.0  MCH 26.9 26.2 26.6 27.0 26.1  MCHC 31.0 30.0 30.3 31.2 30.4  RDW 14.9 14.8 14.8 14.3 14.1  LYMPHSABS 1.5  --   --   --   --   MONOABS 0.4  --   --   --   --   EOSABS 0.0  --   --   --   --   BASOSABS 0.0  --   --   --   --     Chemistries  Recent Labs  Lab 10/01/18 0956 10/02/18 0430 10/03/18 0500 10/04/18 0445  NA 141 138 137 140  K 4.0 4.9 4.8 4.5  CL 100 101 99 101  CO2 _0 GLUCOSE 112* 279* 201* 213*  BUN 22* 18 23* 24*  CREATININE 0.73 0.44 0.39* 0.53  CALCIUM 9.0 8.7* 8.8* 8.6*  MG  --  2.4 2.3 2.2  AST 54* 35 24 26  ALT 115* 93* 76* 77*  ALKPHOS 65 56 53 52  BILITOT 0.5 0.3 0.3 0.2*   ------------------------------------------------------------------------------------------------------------------ No results for input(s): CHOL, HDL, LDLCALC, TRIG, CHOLHDL, LDLDIRECT in the last 72 hours.  Lab Results  Component Value Date   HGBA1C 6.9 (H) 10/02/2018   ------------------------------------------------------------------------------------------------------------------ No results for input(s): TSH, T4TOTAL, T3FREE, THYROIDAB in the last 72 hours.  Invalid input(s): FREET3  Cardiac Enzymes No results for input(s): CKMB, TROPONINI, MYOGLOBIN in the last 168 hours.  Invalid input(s): CK ------------------------------------------------------------------------------------------------------------------    Component Value Date/Time   BNP 5.8 10/02/2018 0500    Micro Results Recent Results (from the past 240 hour(s))  Blood Culture (routine x 2)     Status: None (Preliminary result)   Collection Time: 10/01/18 12:30 PM   Specimen: BLOOD  Result Value Ref Range Status   Specimen Description   Final    BLOOD RIGHT ARM Performed at [facility] [address], [place], [place] [address]    Special Requests   Final    BOTTLES DRAWN AEROBIC AND ANAEROBIC Blood Culture adequate volume Performed at [facility] [address] [place], [place] [address]    Culture   Final    NO GROWTH 3 DAYS Performed at [facility], [place] [address] [place], [place] [address]    Report Status PENDING  Incomplete    Radiology Reports  Ct Angio Chest Pe W Or Wo Contrast  Result Date: 09/25/2018 CLINICAL DATA:  COVID-19 positive.  Suspected pulmonary embolism. EXAM: CT ANGIOGRAPHY CHEST WITH CONTRAST TECHNIQUE: Multidetector CT imaging of the chest was performed using the standard protocol during bolus administration  of intravenous contrast. Multiplanar CT image reconstructions and MIPs were obtained to evaluate the vascular anatomy. CONTRAST:  100mL OMNIPAQUE IOHEXOL 350 MG/ML SOLN COMPARISON:  None. FINDINGS: Cardiovascular: Satisfactory opacification of the pulmonary arteries to the segmental level. No evidence of pulmonary embolism. Normal heart size. No pericardial effusion. Mediastinum/Nodes: Negative for adenopathy or mass Lungs/Pleura: Airspace disease in the lower lobes with clustered micro nodular appearance, asymmetric to the left. The apically lungs are spared. No edema or effusion. Upper Abdomen: There could be hepatic steatosis, but limited by contrast timing. Musculoskeletal: No acute or aggressive finding Review of the MIP images confirms the above findings. IMPRESSION: 1. Bilateral lower lobe pneumonia. There is history of COVID-19 positivity, but this pattern is more nodular and dependent than I have seen with other COVID-19 cases, consider superimposed bacterial pneumonia or aspiration. 2. Negative for pulmonary embolism. Electronically Signed   By: Jonathon  Watts M.D.   On: 09/25/2018 06:51   Dg Chest Port 1 View  Result Date: 10/02/2018 CLINICAL DATA:  Shortness of breath, COVID-19 EXAM: PORTABLE CHEST 1 VIEW COMPARISON:  Portable exam 0736 hours compared to 10/01/2018 FINDINGS: Enlargement of cardiac silhouette. Stable mediastinal contours. Patchy airspace infiltrates bilaterally  increased since previous exam consistent with pneumonia. No pleural effusion or pneumothorax. Bones unremarkable. IMPRESSION: Patchy BILATERAL airspace infiltrates consistent with pneumonia including atypical etiologies, increased since previous exam. Electronically Signed   By: Mark  Boles M.D.   On: 10/02/2018 07:44   Dg Chest Port 1 View  Result Date: 10/01/2018 CLINICAL DATA:  Pelvic positive. Worsening hypoxia. EXAM: PORTABLE CHEST 1 VIEW COMPARISON:  09/26/2018 FINDINGS: There are increased hazy infiltrates at both lung bases. Heart size and pulmonary vascularity remain normal. No bone abnormality. IMPRESSION: Increasing hazy infiltrates at both lung bases. Electronically Signed   By: James  Maxwell M.D.   On: 10/01/2018 10:58   Dg Chest Port 1 View  Result Date: 09/26/2018 CLINICAL DATA:  COVID-19 viral infection, morbid obesity EXAM: PORTABLE CHEST 1 VIEW COMPARISON:  09/25/2018 FINDINGS: Stable cardiomegaly without edema or CHF. No effusion or pneumothorax. Minor basilar increased opacities which correlate with mild basilar patchy interstitial disease by CT compatible with viral pneumonia. No significant change in aeration. Trachea midline. IMPRESSION: Stable cardiomegaly and minor basilar interstitial opacities. Electronically Signed   By: M.  Shick M.D.   On: 09/26/2018 11:40   Dg Chest Port 1 View  Result Date: 09/24/2018 CLINICAL DATA:  Cough and fever EXAM: PORTABLE CHEST 1 VIEW COMPARISON:  None. FINDINGS: Cardiac shadow is within normal limits. The lungs are clear. No bony abnormality is noted. IMPRESSION: No active disease. Electronically Signed   By: Mark  Lukens M.D.   On: 09/24/2018 21:00     

## 2018-10-06 DIAGNOSIS — R0902 Hypoxemia: Secondary | ICD-10-CM

## 2018-10-06 DIAGNOSIS — J069 Acute upper respiratory infection, unspecified: Secondary | ICD-10-CM

## 2018-10-06 LAB — COMPREHENSIVE METABOLIC PANEL
ALT: 52 U/L — ABNORMAL HIGH (ref 0–44)
AST: 14 U/L — ABNORMAL LOW (ref 15–41)
Albumin: 3.6 g/dL (ref 3.5–5.0)
Alkaline Phosphatase: 52 U/L (ref 38–126)
Anion gap: 11 (ref 5–15)
BUN: 27 mg/dL — ABNORMAL HIGH (ref 6–20)
CO2: 27 mmol/L (ref 22–32)
Calcium: 8.1 mg/dL — ABNORMAL LOW (ref 8.9–10.3)
Chloride: 99 mmol/L (ref 98–111)
Creatinine, Ser: 0.57 mg/dL (ref 0.44–1.00)
GFR calc Af Amer: >60 mL/min (ref >60)
GFR calc non Af Amer: >60 mL/min (ref >60)
Glucose, Bld: 217 mg/dL — ABNORMAL HIGH (ref 70–99)
Potassium: 5 mmol/L (ref 3.5–5.1)
Sodium: 137 mmol/L (ref 135–145)
Total Bilirubin: 0.3 mg/dL (ref 0.3–1.2)
Total Protein: 6.5 g/dL (ref 6.5–8.1)

## 2018-10-06 LAB — CBC
HCT: 43.2 % (ref 36.0–46.0)
Hemoglobin: 13.4 g/dL (ref 12.0–15.0)
MCH: 26.8 pg (ref 26.0–34.0)
MCHC: 31 g/dL (ref 30.0–36.0)
MCV: 86.4 fL (ref 80.0–100.0)
Platelets: 258 10*3/uL (ref 150–400)
RBC: 5 MIL/uL (ref 3.87–5.11)
RDW: 14.3 % (ref 11.5–15.5)
WBC: 5.9 10*3/uL (ref 4.0–10.5)
nRBC: 0 % (ref 0.0–0.2)

## 2018-10-06 LAB — CULTURE, BLOOD (ROUTINE X 2)
Culture: NO GROWTH
Special Requests: ADEQUATE

## 2018-10-06 LAB — MAGNESIUM: Magnesium: 2.3 mg/dL (ref 1.7–2.4)

## 2018-10-06 LAB — C-REACTIVE PROTEIN: CRP: 0.8 mg/dL (ref <1.0)

## 2018-10-06 LAB — GLUCOSE, CAPILLARY
Glucose-Capillary: 206 mg/dL — ABNORMAL HIGH (ref 70–99)
Glucose-Capillary: 400 mg/dL — ABNORMAL HIGH (ref 70–99)
Glucose-Capillary: 321 mg/dL — ABNORMAL HIGH (ref 70–99)
Glucose-Capillary: 210 mg/dL — ABNORMAL HIGH (ref 70–99)

## 2018-10-06 LAB — PHOSPHORUS: Phosphorus: 4.2 mg/dL (ref 2.5–4.6)

## 2018-10-06 LAB — D-DIMER, QUANTITATIVE: D-Dimer, Quant: 0.39 ug/mL-FEU (ref 0.00–0.50)

## 2018-10-06 LAB — FERRITIN: Ferritin: 83 ng/mL (ref 11–307)

## 2018-10-06 MED ORDER — METHYLPREDNISOLONE SODIUM SUCC 40 MG IJ SOLR: 40.0000 mg | Freq: Every day | INTRAMUSCULAR | Status: DC

## 2018-10-06 MED ORDER — METHYLPREDNISOLONE SODIUM SUCC 40 MG IJ SOLR
40.0000 mg | Freq: Two times a day (BID) | INTRAMUSCULAR | Status: DC
  Administered 2018-10-06: 09:00:00 40 mg via INTRAVENOUS
  Filled 2018-10-06: qty 1

## 2018-10-06 MED ORDER — FUROSEMIDE 10 MG/ML IJ SOLN
20.0000 mg | Freq: Once | INTRAMUSCULAR | Status: AC
  Administered 2018-10-06: 09:00:00 20 mg via INTRAVENOUS
  Filled 2018-10-06: qty 2

## 2018-10-06 MED ORDER — SODIUM POLYSTYRENE SULFONATE 15 GM/60ML PO SUSP
15.0000 g | Freq: Once | ORAL | Status: AC
  Administered 2018-10-06: 15 g via ORAL
  Filled 2018-10-06: qty 60

## 2018-10-06 NOTE — Progress Notes (Signed)
PROGRESS NOTE                                                                                                                                                                                                             Patient Demographics:    Karen Barajas, is a 28 y.o. female, DOB - Dec 03, 1990, GNF:[id number]  Outpatient Primary MD for the patient is Patient, No Pcp Per    LOS - 5  Admit date - 10/01/2018    Chief Complaint  Patient presents with  . COVID  . Shortness of Breath       Brief Narrative  Karen Barajas is a 28 y.o. female with medical history significant morbid obesity, recently discharged from [facility] with diagnosis of COVID-19 PNA,  she presented with worsening shortness of breath and was readmitted for further care.   Subjective:   Patient in bed, appears comfortable, denies any headache, no fever, no chest pain or pressure, much improved shortness of breath , no abdominal pain. No focal weakness.   Assessment  & Plan :     1. Acute Hypoxic Resp. Failure due to Acute Covid 19 Viral Pneumonitis during the ongoing 2020 Covid 19 Pandemic - she needed 10Lit HF o2 upon admission, she has been placed on IV steroids also received IV Remdisvir along with Actemra on the day of admission.  Risks and benefits were explained to the patient by the admitting physician.  Today on 10/06/2018 she feels a whole lot better and now down to 5 L nasal cannula oxygen, no shortness of breath at rest, advance activity, added flutter valve and I-S for pulmonary toiletry, encouraged to sit up in the daytime in chair and prone when in bed at night.  Continue to titrate down oxygen most likely discharge in 1 to 2 days most likely will go home on some home oxygen.    COVID-19 Labs  Recent Labs    10/04/18 0445 10/05/18 0257 10/06/18 0248  DDIMER 0.41 0.39 0.39  FERRITIN 98 91 83  CRP 2.6* 1.4* 0.8     Lab Results  Component Value Date   SARSCOV2NAA POSITIVE (A) 09/24/2018     Hepatic Function Latest Ref Rng & Units 10/06/2018 10/05/2018 10/04/2018  Total Protein 6.5 - 8.1 g/dL 6.5 6.9 6.7  Albumin 3.5 - 5.0 g/dL 3.6  3.5 3.4(L)  AST 15 - 41 U/L 14(L) 27 26  ALT 0 - 44 U/L 52(H) 62(H) 77(H)  Alk Phosphatase 38 - 126 U/L 52 63 52  Total Bilirubin 0.3 - 1.2 mg/dL 0.3 0.4 0.2(L)        Component Value Date/Time   BNP 5.8 10/02/2018 0500     2.  Morbid obesity.  Follow with PCP for weight loss.  3.  Mild transaminitis due to combination of COVID-19 infection, Actemra and IV REMDESIVIR use.  Trend overall is improving and downtrending, continue to monitor intermittently thereafter PCP to check 7 to 10 days post discharge.     Condition -  Guarded   Family Communication  :  None  Code Status :  Full  Diet :    Diet Order            Diet regular Room service appropriate? Yes; Fluid consistency: Thin  Diet effective now               Disposition Plan  : Home likely in 2 to 3 days depending on clinical improvement.  Most likely will require short-term home oxygen.  Consults  :  None  Procedures  :  CTA 1 week ago - PNA   PUD Prophylaxis :    DVT Prophylaxis  :  Lovenox  BID  Lab Results  Component Value Date   PLT 258 10/06/2018    Inpatient Medications  Scheduled Meds: . dextromethorphan-guaiFENesin  1 tablet Oral BID  . enoxaparin (LOVENOX) injection  40 mg Subcutaneous Q12H  . insulin aspart  0-15 Units Subcutaneous TID WC  . insulin aspart  0-5 Units Subcutaneous QHS  . methylPREDNISolone (SOLU-MEDROL) injection  40 mg Intravenous Q12H   Continuous Infusions:  PRN Meds:.acetaminophen, chlorpheniramine-HYDROcodone, ondansetron (ZOFRAN) IV  Antibiotics  :    Anti-infectives (From admission, onward)   Start     Dose/Rate Route Frequency Ordered Stop   10/02/18 2130  remdesivir 100 mg in sodium chloride 0.9 % 250 mL IVPB     100 mg 500 mL/hr over 30  Minutes Intravenous Every 24 hours 10/01/18 2045 10/05/18 2309   10/01/18 2130  remdesivir 200 mg in sodium chloride 0.9 % 250 mL IVPB     200 mg 500 mL/hr over 30 Minutes Intravenous Once 10/01/18 2045 10/01/18 2210   10/01/18 1100  cefTRIAXone (ROCEPHIN) 1 g in sodium chloride 0.9 % 100 mL IVPB     1 g 200 mL/hr over 30 Minutes Intravenous  Once 10/01/18 1050 10/01/18 1333   10/01/18 1100  azithromycin (ZITHROMAX) 500 mg in sodium chloride 0.9 % 250 mL IVPB     500 mg 250 mL/hr over 60 Minutes Intravenous  Once 10/01/18 1050 10/01/18 1547       Time Spent in minutes  30   Lala Lund M.D on 10/06/2018 at 9:44 AM  To page go to [email or web address] - password [facility]  [facility] -  Office  [phone]   See all Orders from today for further details    Objective:   Vitals:   10/05/18 1200 10/05/18 1600 10/05/18 2100 10/06/18 0300  BP: (!) 108/57 (!) 117/57 (!) 111/54 102/64  Pulse: 73 61 75 81  Resp: (!) 31 (!) 29 (!) 21 (!) 23  Temp: 98.8 F (37.1 C) 98.6 F (37 C) 98.4 F (36.9 C) 98.9 F (37.2 C)  TempSrc: Oral Oral Oral Oral  SpO2: (!) 89% 99% 97% 95%  Weight:  Height:        Wt Readings from Last 3 Encounters:  10/01/18 132.5 kg  09/25/18 (!) 156.7 kg  07/05/17 116.2 kg    No intake or output data in the 24 hours ending 10/06/18 0944   Physical Exam  Awake Alert, Oriented X 3, No new F.N deficits, Normal affect [place].AT,PERRAL Supple Neck,No JVD, No cervical lymphadenopathy appriciated.  Symmetrical Chest wall movement, Good air movement bilaterally, CTAB RRR,No Gallops, Rubs or new Murmurs, No Parasternal Heave +ve B.Sounds, Abd Soft, No tenderness, No organomegaly appriciated, No rebound - guarding or rigidity. No Cyanosis, Clubbing or edema, No new Rash or bruise    Data Review:    CBC Recent Labs  Lab 10/01/18 0956 10/02/18 0430 10/03/18 0500 10/04/18 0445 10/05/18 0520 10/06/18 0248  WBC 8.1 3.3* 4.7 4.7 4.6 5.9  HGB 15.2* 13.0  13.4 13.6 13.1 13.4  HCT 49.0* 43.3 44.2 43.6 43.1 43.2  PLT 256 218 262 282 266 258  MCV 86.7 87.1 87.9 86.7 86.0 86.4  MCH 26.9 26.2 26.6 27.0 26.1 26.8  MCHC 31.0 30.0 30.3 31.2 30.4 31.0  RDW 14.9 14.8 14.8 14.3 14.1 14.3  LYMPHSABS 1.5  --   --   --   --   --   MONOABS 0.4  --   --   --   --   --   EOSABS 0.0  --   --   --   --   --   BASOSABS 0.0  --   --   --   --   --     Chemistries  Recent Labs  Lab 10/02/18 0430 10/03/18 0500 10/04/18 0445 10/05/18 0257 10/06/18 0248  NA 138 137 140 143 137  K 4.9 4.8 4.5 5.9* 5.0  CL 101 99 101 107 99  CO2 _0 15* 27  GLUCOSE 279* 201* 213* 226* 217*  BUN 18 23* 24* 27* 27*  CREATININE 0.44 0.39* 0.53 0.51 0.57  CALCIUM 8.7* 8.8* 8.6* 8.8* 8.1*  MG 2.4 2.3 2.2 2.3 2.3  AST 35 _1 14*  ALT 93* 76* 77* 62* 52*  ALKPHOS 56 53 52 63 52  BILITOT 0.3 0.3 0.2* 0.4 0.3   ------------------------------------------------------------------------------------------------------------------ No results for input(s): CHOL, HDL, LDLCALC, TRIG, CHOLHDL, LDLDIRECT in the last 72 hours.  Lab Results  Component Value Date   HGBA1C 6.9 (H) 10/02/2018   ------------------------------------------------------------------------------------------------------------------ No results for input(s): TSH, T4TOTAL, T3FREE, THYROIDAB in the last 72 hours.  Invalid input(s): FREET3  Cardiac Enzymes No results for input(s): CKMB, TROPONINI, MYOGLOBIN in the last 168 hours.  Invalid input(s): CK ------------------------------------------------------------------------------------------------------------------    Component Value Date/Time   BNP 5.8 10/02/2018 0500    Micro Results Recent Results (from the past 240 hour(s))  Blood Culture (routine x 2)     Status: None (Preliminary result)   Collection Time: 10/01/18 12:30 PM   Specimen: BLOOD  Result Value Ref Range Status   Specimen Description   Final    BLOOD RIGHT ARM Performed at  [facility] [address] [place], [place] [address]    Special Requests   Final    BOTTLES DRAWN AEROBIC AND ANAEROBIC Blood Culture adequate volume Performed at [facility] [address], [place], [place] [address]    Culture   Final    NO GROWTH 4 DAYS Performed at [facility], [place] [address] [place],  [address]    Report Status  PENDING  Incomplete    Radiology Reports  Ct Angio Chest Pe W Or Wo Contrast  Result Date: 09/25/2018 CLINICAL DATA:  COVID-19 positive.  Suspected pulmonary embolism. EXAM: CT ANGIOGRAPHY CHEST WITH CONTRAST TECHNIQUE: Multidetector CT imaging of the chest was performed using the standard protocol during bolus administration of intravenous contrast. Multiplanar CT image reconstructions and MIPs were obtained to evaluate the vascular anatomy. CONTRAST:  171m OMNIPAQUE IOHEXOL 350 MG/ML SOLN COMPARISON:  None. FINDINGS: Cardiovascular: Satisfactory opacification of the pulmonary arteries to the segmental level. No evidence of pulmonary embolism. Normal heart size. No pericardial effusion. Mediastinum/Nodes: Negative for adenopathy or mass Lungs/Pleura: Airspace disease in the lower lobes with clustered micro nodular appearance, asymmetric to the left. The apically lungs are spared. No edema or effusion. Upper Abdomen: There could be hepatic steatosis, but limited by contrast timing. Musculoskeletal: No acute or aggressive finding Review of the MIP images confirms the above findings. IMPRESSION: 1. Bilateral lower lobe pneumonia. There is history of COVID-19 positivity, but this pattern is more nodular and dependent than I have seen with other COVID-19 cases, consider superimposed bacterial pneumonia or aspiration. 2. Negative for pulmonary embolism. Electronically Signed   By: JMonte FantasiaM.D.   On: 09/25/2018 06:51   Dg Chest Port 1 View  Result Date: 10/02/2018 CLINICAL DATA:  Shortness of breath,  COVID-19 EXAM: PORTABLE CHEST 1 VIEW COMPARISON:  Portable exam 0736 hours compared to 10/01/2018 FINDINGS: Enlargement of cardiac silhouette. Stable mediastinal contours. Patchy airspace infiltrates bilaterally increased since previous exam consistent with pneumonia. No pleural effusion or pneumothorax. Bones unremarkable. IMPRESSION: Patchy BILATERAL airspace infiltrates consistent with pneumonia including atypical etiologies, increased since previous exam. Electronically Signed   By: MLavonia DanaM.D.   On: 10/02/2018 07:44   Dg Chest Port 1 View  Result Date: 10/01/2018 CLINICAL DATA:  Pelvic positive. Worsening hypoxia. EXAM: PORTABLE CHEST 1 VIEW COMPARISON:  09/26/2018 FINDINGS: There are increased hazy infiltrates at both lung bases. Heart size and pulmonary vascularity remain normal. No bone abnormality. IMPRESSION: Increasing hazy infiltrates at both lung bases. Electronically Signed   By: JLorriane ShireM.D.   On: 10/01/2018 10:58   Dg Chest Port 1 View  Result Date: 09/26/2018 CLINICAL DATA:  COVID-19 viral infection, morbid obesity EXAM: PORTABLE CHEST 1 VIEW COMPARISON:  09/25/2018 FINDINGS: Stable cardiomegaly without edema or CHF. No effusion or pneumothorax. Minor basilar increased opacities which correlate with mild basilar patchy interstitial disease by CT compatible with viral pneumonia. No significant change in aeration. Trachea midline. IMPRESSION: Stable cardiomegaly and minor basilar interstitial opacities. Electronically Signed   By: MJerilynn Mages  Shick M.D.   On: 09/26/2018 11:40   Dg Chest Port 1 View  Result Date: 09/24/2018 CLINICAL DATA:  Cough and fever EXAM: PORTABLE CHEST 1 VIEW COMPARISON:  None. FINDINGS: Cardiac shadow is within normal limits. The lungs are clear. No bony abnormality is noted. IMPRESSION: No active disease. Electronically Signed   By: MInez CatalinaM.D.   On: 09/24/2018 21:00

## 2018-10-06 NOTE — Progress Notes (Signed)
Inpatient Diabetes Program Recommendations  AACE/ADA: New Consensus Statement on Inpatient Glycemic Control (2015)  Target Ranges:  Prepandial:   less than 140 mg/dL      Peak postprandial:   less than 180 mg/dL (1-2 hours)      Critically ill patients:  140 - 180 mg/dL   Lab Results  Component Value Date   GLUCAP 206 (H) 10/06/2018   HGBA1C 6.9 (H) 10/02/2018    Review of Glycemic Control Results for SWAY, GUTTIERREZ (MRN [id number]) as of 10/06/2018 11:46  Ref. Range 10/05/2018 12:37 10/05/2018 13:23 10/05/2018 16:15 10/05/2018 21:40 10/06/2018 08:47  Glucose-Capillary Latest Ref Range: 70 - 99 mg/dL 263 (H) 292 (H) 305 (H) 246 (H) 206 (H)   Inpatient Diabetes Program Recommendations:    Please consider while on steroids: -Novolog 5 units tid meal coverage if eats 50% Amion message to Dr. Candiss Norse with recommendations.  Thank you, Nani Gasser. Callan Norden, RN, MSN, CDE  Diabetes Coordinator Inpatient Glycemic Control Team Team Pager [phone] (8am-5pm) 10/06/2018 11:48 AM

## 2018-10-07 DIAGNOSIS — U071 COVID-19: Principal | ICD-10-CM

## 2018-10-07 LAB — COMPREHENSIVE METABOLIC PANEL
ALT: 42 U/L (ref 0–44)
AST: 11 U/L — ABNORMAL LOW (ref 15–41)
Albumin: 3.6 g/dL (ref 3.5–5.0)
Alkaline Phosphatase: 50 U/L (ref 38–126)
Anion gap: 12 (ref 5–15)
BUN: 31 mg/dL — ABNORMAL HIGH (ref 6–20)
CO2: 29 mmol/L (ref 22–32)
Calcium: 8.9 mg/dL (ref 8.9–10.3)
Chloride: 101 mmol/L (ref 98–111)
Creatinine, Ser: 0.54 mg/dL (ref 0.44–1.00)
GFR calc Af Amer: >60 mL/min (ref >60)
GFR calc non Af Amer: >60 mL/min (ref >60)
Glucose, Bld: 96 mg/dL (ref 70–99)
Potassium: 3.8 mmol/L (ref 3.5–5.1)
Sodium: 142 mmol/L (ref 135–145)
Total Bilirubin: 0.7 mg/dL (ref 0.3–1.2)
Total Protein: 6.5 g/dL (ref 6.5–8.1)

## 2018-10-07 LAB — MAGNESIUM: Magnesium: 2.5 mg/dL — ABNORMAL HIGH (ref 1.7–2.4)

## 2018-10-07 LAB — CBC WITH DIFFERENTIAL/PLATELET
Abs Immature Granulocytes: 0.06 10*3/uL (ref 0.00–0.07)
Basophils Absolute: 0 10*3/uL (ref 0.0–0.1)
Basophils Relative: 0 %
Eosinophils Absolute: 0 10*3/uL (ref 0.0–0.5)
Eosinophils Relative: 0 %
HCT: 44.8 % (ref 36.0–46.0)
Hemoglobin: 13.8 g/dL (ref 12.0–15.0)
Immature Granulocytes: 1 %
Lymphocytes Relative: 33 %
Lymphs Abs: 2.7 10*3/uL (ref 0.7–4.0)
MCH: 26.4 pg (ref 26.0–34.0)
MCHC: 30.8 g/dL (ref 30.0–36.0)
MCV: 85.8 fL (ref 80.0–100.0)
Monocytes Absolute: 0.6 10*3/uL (ref 0.1–1.0)
Monocytes Relative: 7 %
Neutro Abs: 4.9 10*3/uL (ref 1.7–7.7)
Neutrophils Relative %: 59 %
Platelets: 291 10*3/uL (ref 150–400)
RBC: 5.22 MIL/uL — ABNORMAL HIGH (ref 3.87–5.11)
RDW: 14.1 % (ref 11.5–15.5)
WBC: 8.3 10*3/uL (ref 4.0–10.5)
nRBC: 0 % (ref 0.0–0.2)

## 2018-10-07 LAB — LACTATE DEHYDROGENASE: LDH: 206 U/L — ABNORMAL HIGH (ref 98–192)

## 2018-10-07 LAB — ABO/RH: ABO/RH(D): A POS

## 2018-10-07 LAB — C-REACTIVE PROTEIN: CRP: <0.8 mg/dL (ref <1.0)

## 2018-10-07 LAB — GLUCOSE, CAPILLARY
Glucose-Capillary: 126 mg/dL — ABNORMAL HIGH (ref 70–99)
Glucose-Capillary: 225 mg/dL — ABNORMAL HIGH (ref 70–99)
Glucose-Capillary: 233 mg/dL — ABNORMAL HIGH (ref 70–99)
Glucose-Capillary: 183 mg/dL — ABNORMAL HIGH (ref 70–99)

## 2018-10-07 LAB — BRAIN NATRIURETIC PEPTIDE: B Natriuretic Peptide: 18.3 pg/mL (ref 0.0–100.0)

## 2018-10-07 LAB — FERRITIN: Ferritin: 79 ng/mL (ref 11–307)

## 2018-10-07 MED ORDER — LACTATED RINGERS IV SOLN
INTRAVENOUS | Status: AC
  Administered 2018-10-07: 10:00:00 via INTRAVENOUS

## 2018-10-07 MED ORDER — VITAMIN C 500 MG PO TABS
500.0000 mg | ORAL_TABLET | Freq: Every day | ORAL | Status: DC
  Administered 2018-10-07 – 2018-10-09 (×3): 500 mg via ORAL
  Filled 2018-10-07 (×3): qty 1

## 2018-10-07 MED ORDER — HYDROCOD POLST-CPM POLST ER 10-8 MG/5ML PO SUER: 5.0000 mL | Freq: Two times a day (BID) | ORAL | Status: DC | PRN

## 2018-10-07 MED ORDER — ONDANSETRON HCL 4 MG PO TABS: 4.0000 mg | ORAL_TABLET | Freq: Four times a day (QID) | ORAL | Status: DC | PRN

## 2018-10-07 MED ORDER — LACTATED RINGERS IV SOLN
INTRAVENOUS | Status: AC
  Administered 2018-10-07: 13:00:00 via INTRAVENOUS

## 2018-10-07 MED ORDER — SENNOSIDES-DOCUSATE SODIUM 8.6-50 MG PO TABS: 1.0000 | ORAL_TABLET | Freq: Every evening | ORAL | Status: DC | PRN

## 2018-10-07 MED ORDER — ZINC SULFATE 220 (50 ZN) MG PO CAPS
220.0000 mg | ORAL_CAPSULE | Freq: Every day | ORAL | Status: DC
  Administered 2018-10-07 – 2018-10-09 (×3): 220 mg via ORAL
  Filled 2018-10-07 (×2): qty 1

## 2018-10-07 MED ORDER — ONDANSETRON HCL 4 MG/2ML IJ SOLN: 4.0000 mg | Freq: Four times a day (QID) | INTRAMUSCULAR | Status: DC | PRN

## 2018-10-07 MED ORDER — METHYLPREDNISOLONE SODIUM SUCC 40 MG IJ SOLR
30.0000 mg | Freq: Every day | INTRAMUSCULAR | Status: DC
  Administered 2018-10-07 – 2018-10-08 (×2): 30 mg via INTRAVENOUS
  Filled 2018-10-07 (×2): qty 1

## 2018-10-07 MED ORDER — ACETAMINOPHEN 325 MG PO TABS: 650.0000 mg | ORAL_TABLET | Freq: Four times a day (QID) | ORAL | Status: DC | PRN

## 2018-10-07 MED ORDER — IPRATROPIUM-ALBUTEROL 20-100 MCG/ACT IN AERS
1.0000 | INHALATION_SPRAY | Freq: Four times a day (QID) | RESPIRATORY_TRACT | Status: DC
  Administered 2018-10-07 – 2018-10-09 (×8): 1 via RESPIRATORY_TRACT
  Filled 2018-10-07: qty 4

## 2018-10-07 MED ORDER — GUAIFENESIN-DM 100-10 MG/5ML PO SYRP
10.0000 mL | ORAL_SOLUTION | ORAL | Status: DC | PRN
  Administered 2018-10-08: 10 mL via ORAL
  Filled 2018-10-07: qty 10

## 2018-10-07 MED ORDER — HYDROCODONE-ACETAMINOPHEN 5-325 MG PO TABS
1.0000 | ORAL_TABLET | ORAL | Status: DC | PRN
  Administered 2018-10-09: 2 via ORAL
  Filled 2018-10-07 (×2): qty 2

## 2018-10-07 NOTE — Progress Notes (Signed)
Patient OOB to chair for breakfast, oxygen weaned to 3L [place], sats decreased to 87-89%, increased back to 4L [place].  C/o dizziness in chair, BP 84/59, HR 98.  Returned to bed per patient request.

## 2018-10-07 NOTE — Progress Notes (Signed)
PROGRESS NOTE                                                                                                                                                                                                             Patient Demographics:    Karen Barajas, is a 28 y.o. female, DOB - 1990-09-29, IFO:[id number]  Outpatient Primary MD for the patient is Patient, No Pcp Per    LOS - 6  Admit date - 10/01/2018    Chief Complaint  Patient presents with  . COVID  . Shortness of Breath       Brief Narrative  Karen Barajas is a 28 y.o. female with medical history significant morbid obesity, recently discharged from [facility] with diagnosis of COVID-19 PNA,  she presented with worsening shortness of breath and was readmitted for further care.   Subjective:   Patient in bed, appears comfortable, denies any headache, no fever, no chest pain or pressure, much improved shortness of breath , no abdominal pain. No focal weakness.   Assessment  & Plan :     1. Acute Hypoxic Resp. Failure due to Acute Covid 19 Viral Pneumonitis during the ongoing 2020 Covid 19 Pandemic - she needed 10Lit HF o2 upon admission, she has been placed on IV steroids also received IV Remdisvir along with Actemra on the day of admission.  Risks and benefits were explained to the patient by the admitting physician.    She is finally feeling a whole lot better and does not feel that she is short of breath, now down to 4 L nasal cannula oxygen, advance activity, added flutter valve and I-S for pulmonary toiletry, encouraged to sit up in the daytime in chair and prone when in bed at night.  Continue to titrate down oxygen most likely discharge in 1 to 2 days most likely will go home on some home oxygen.  Continue gradual taper of IV steroids.    COVID-19 Labs  Recent Labs    10/05/18 0257 10/06/18 0248 10/07/18 0240  DDIMER 0.39 0.39   --   FERRITIN 91 83 79  LDH  --   --  206*  CRP 1.4* 0.8 <0.8    Lab Results  Component Value Date   SARSCOV2NAA POSITIVE (A) 09/24/2018     Hepatic Function Latest Ref Rng &  Units 10/07/2018 10/06/2018 10/05/2018  Total Protein 6.5 - 8.1 g/dL 6.5 6.5 6.9  Albumin 3.5 - 5.0 g/dL 3.6 3.6 3.5  AST 15 - 41 U/L 11(L) 14(L) 27  ALT 0 - 44 U/L 42 52(H) 62(H)  Alk Phosphatase 38 - 126 U/L 50 52 63  Total Bilirubin 0.3 - 1.2 mg/dL 0.7 0.3 0.4        Component Value Date/Time   BNP 18.3 10/07/2018 0240     2.  Morbid obesity.  Follow with PCP for weight loss.  3.  Mild transaminitis due to combination of COVID-19 infection, Actemra and IV REMDESIVIR use.  Trend overall is improving and downtrending, continue to monitor intermittently thereafter PCP to check 7 to 10 days post discharge.   4.  Mild orthostatic hypotension.  Dehydrated.  Hydrate with IVF and monitor.    Condition -  Guarded   Family Communication  :  None  Code Status :  Full  Diet :    Diet Order            Diet regular Room service appropriate? Yes; Fluid consistency: Thin  Diet effective now               Disposition Plan  : Home likely in 2 to 3 days depending on clinical improvement.  Most likely will require short-term home oxygen.  Consults  :  None  Procedures  :  CTA 1 week ago - PNA   PUD Prophylaxis :    DVT Prophylaxis  :  Lovenox  BID  Lab Results  Component Value Date   PLT 291 10/07/2018    Inpatient Medications  Scheduled Meds: . dextromethorphan-guaiFENesin  1 tablet Oral BID  . enoxaparin (LOVENOX) injection  40 mg Subcutaneous Q12H  . insulin aspart  0-15 Units Subcutaneous TID WC  . insulin aspart  0-5 Units Subcutaneous QHS  . methylPREDNISolone (SOLU-MEDROL) injection  30 mg Intravenous Daily   Continuous Infusions: . lactated ringers     PRN Meds:.acetaminophen, chlorpheniramine-HYDROcodone, ondansetron (ZOFRAN) IV  Antibiotics  :    Anti-infectives (From  admission, onward)   Start     Dose/Rate Route Frequency Ordered Stop   10/02/18 2130  remdesivir 100 mg in sodium chloride 0.9 % 250 mL IVPB     100 mg 500 mL/hr over 30 Minutes Intravenous Every 24 hours 10/01/18 2045 10/05/18 2309   10/01/18 2130  remdesivir 200 mg in sodium chloride 0.9 % 250 mL IVPB     200 mg 500 mL/hr over 30 Minutes Intravenous Once 10/01/18 2045 10/01/18 2210   10/01/18 1100  cefTRIAXone (ROCEPHIN) 1 g in sodium chloride 0.9 % 100 mL IVPB     1 g 200 mL/hr over 30 Minutes Intravenous  Once 10/01/18 1050 10/01/18 1333   10/01/18 1100  azithromycin (ZITHROMAX) 500 mg in sodium chloride 0.9 % 250 mL IVPB     500 mg 250 mL/hr over 60 Minutes Intravenous  Once 10/01/18 1050 10/01/18 1547       Time Spent in minutes  30   Lala Lund M.D on 10/07/2018 at 10:11 AM  To page go to [email or web address] - password [facility]  [facility] -  Office  [phone]   See all Orders from today for further details    Objective:   Vitals:   10/07/18 0800 10/07/18 0803 10/07/18 0914 10/07/18 0929  BP: (!) 105/47  (!) 84/59   Pulse: 87 78 98 80  Resp: 15 (!) 29 (!) 29 Marland Kitchen)  32  Temp:   97.6 F (36.4 C)   TempSrc:   Oral   SpO2: 92% 94% (!) 89% 93%  Weight:      Height:        Wt Readings from Last 3 Encounters:  10/01/18 132.5 kg  09/25/18 (!) 156.7 kg  07/05/17 116.2 kg     Intake/Output Summary (Last 24 hours) at 10/07/2018 1011 Last data filed at 10/07/2018 0800 Gross per 24 hour  Intake 480 ml  Output 650 ml  Net -170 ml     Physical Exam  Awake Alert,   No new F.N deficits, Normal affect [place].AT,PERRAL Supple Neck,No JVD, No cervical lymphadenopathy appriciated.  Symmetrical Chest wall movement, Good air movement bilaterally, CTAB RRR,No Gallops, Rubs or new Murmurs, No Parasternal Heave +ve B.Sounds, Abd Soft, No tenderness, No organomegaly appriciated, No rebound - guarding or rigidity. No Cyanosis, Clubbing or edema, No new Rash or bruise      Data Review:    CBC Recent Labs  Lab 10/01/18 0956  10/03/18 0500 10/04/18 0445 10/05/18 0520 10/06/18 0248 10/07/18 0240  WBC 8.1   < > 4.7 4.7 4.6 5.9 8.3  HGB 15.2*   < > 13.4 13.6 13.1 13.4 13.8  HCT 49.0*   < > 44.2 43.6 43.1 43.2 44.8  PLT 256   < > 262 282 266 258 291  MCV 86.7   < > 87.9 86.7 86.0 86.4 85.8  MCH 26.9   < > 26.6 27.0 26.1 26.8 26.4  MCHC 31.0   < > 30.3 31.2 30.4 31.0 30.8  RDW 14.9   < > 14.8 14.3 14.1 14.3 14.1  LYMPHSABS 1.5  --   --   --   --   --  2.7  MONOABS 0.4  --   --   --   --   --  0.6  EOSABS 0.0  --   --   --   --   --  0.0  BASOSABS 0.0  --   --   --   --   --  0.0   < > = values in this interval not displayed.    Chemistries  Recent Labs  Lab 10/03/18 0500 10/04/18 0445 10/05/18 0257 10/06/18 0248 10/07/18 0240  NA 137 140 143 137 142  K 4.8 4.5 5.9* 5.0 3.8  CL 99 101 107 99 101  CO2 26 28 15* 27 29  GLUCOSE 201* 213* 226* 217* 96  BUN 23* 24* 27* 27* 31*  CREATININE 0.39* 0.53 0.51 0.57 0.54  CALCIUM 8.8* 8.6* 8.8* 8.1* 8.9  MG 2.3 2.2 2.3 2.3 2.5*  AST 24 26 27  14* 11*  ALT 76* 77* 62* 52* 42  ALKPHOS 53 52 63 52 50  BILITOT 0.3 0.2* 0.4 0.3 0.7   ------------------------------------------------------------------------------------------------------------------ No results for input(s): CHOL, HDL, LDLCALC, TRIG, CHOLHDL, LDLDIRECT in the last 72 hours.  Lab Results  Component Value Date   HGBA1C 6.9 (H) 10/02/2018   ------------------------------------------------------------------------------------------------------------------ No results for input(s): TSH, T4TOTAL, T3FREE, THYROIDAB in the last 72 hours.  Invalid input(s): FREET3  Cardiac Enzymes No results for input(s): CKMB, TROPONINI, MYOGLOBIN in the last 168 hours.  Invalid input(s): CK ------------------------------------------------------------------------------------------------------------------    Component Value Date/Time   BNP 18.3 10/07/2018  0240    Micro Results Recent Results (from the past 240 hour(s))  Blood Culture (routine x 2)     Status: None   Collection Time: 10/01/18 12:30 PM   Specimen: BLOOD  Result Value Ref Range Status   Specimen Description   Final    BLOOD RIGHT ARM Performed at [facility] [address] [place], [place] [address]    Special Requests   Final    BOTTLES DRAWN AEROBIC AND ANAEROBIC Blood Culture adequate volume Performed at [facility] [address], [place], [place] [address]    Culture   Final    NO GROWTH 5 DAYS Performed at [facility], [place] [address], [place], [place] [address]    Report Status 10/06/2018 FINAL  Final    Radiology Reports  Ct Angio Chest Pe W Or Wo Contrast  Result Date: 09/25/2018 CLINICAL DATA:  COVID-19 positive.  Suspected pulmonary embolism. EXAM: CT ANGIOGRAPHY CHEST WITH CONTRAST TECHNIQUE: Multidetector CT imaging of the chest was performed using the standard protocol during bolus administration of intravenous contrast. Multiplanar CT image reconstructions and MIPs were obtained to evaluate the vascular anatomy. CONTRAST:  121m OMNIPAQUE IOHEXOL 350 MG/ML SOLN COMPARISON:  None. FINDINGS: Cardiovascular: Satisfactory opacification of the pulmonary arteries to the segmental level. No evidence of pulmonary embolism. Normal heart size. No pericardial effusion. Mediastinum/Nodes: Negative for adenopathy or mass Lungs/Pleura: Airspace disease in the lower lobes with clustered micro nodular appearance, asymmetric to the left. The apically lungs are spared. No edema or effusion. Upper Abdomen: There could be hepatic steatosis, but limited by contrast timing. Musculoskeletal: No acute or aggressive finding Review of the MIP images confirms the above findings. IMPRESSION: 1. Bilateral lower lobe pneumonia. There is history of COVID-19 positivity, but this pattern is more nodular and dependent than I have seen with other  COVID-19 cases, consider superimposed bacterial pneumonia or aspiration. 2. Negative for pulmonary embolism. Electronically Signed   By: JMonte FantasiaM.D.   On: 09/25/2018 06:51   Dg Chest Port 1 View  Result Date: 10/02/2018 CLINICAL DATA:  Shortness of breath, COVID-19 EXAM: PORTABLE CHEST 1 VIEW COMPARISON:  Portable exam 0736 hours compared to 10/01/2018 FINDINGS: Enlargement of cardiac silhouette. Stable mediastinal contours. Patchy airspace infiltrates bilaterally increased since previous exam consistent with pneumonia. No pleural effusion or pneumothorax. Bones unremarkable. IMPRESSION: Patchy BILATERAL airspace infiltrates consistent with pneumonia including atypical etiologies, increased since previous exam. Electronically Signed   By: MLavonia DanaM.D.   On: 10/02/2018 07:44   Dg Chest Port 1 View  Result Date: 10/01/2018 CLINICAL DATA:  Pelvic positive. Worsening hypoxia. EXAM: PORTABLE CHEST 1 VIEW COMPARISON:  09/26/2018 FINDINGS: There are increased hazy infiltrates at both lung bases. Heart size and pulmonary vascularity remain normal. No bone abnormality. IMPRESSION: Increasing hazy infiltrates at both lung bases. Electronically Signed   By: JLorriane ShireM.D.   On: 10/01/2018 10:58   Dg Chest Port 1 View  Result Date: 09/26/2018 CLINICAL DATA:  COVID-19 viral infection, morbid obesity EXAM: PORTABLE CHEST 1 VIEW COMPARISON:  09/25/2018 FINDINGS: Stable cardiomegaly without edema or CHF. No effusion or pneumothorax. Minor basilar increased opacities which correlate with mild basilar patchy interstitial disease by CT compatible with viral pneumonia. No significant change in aeration. Trachea midline. IMPRESSION: Stable cardiomegaly and minor basilar interstitial opacities. Electronically Signed   By: MJerilynn Mages  Shick M.D.   On: 09/26/2018 11:40   Dg Chest Port 1 View  Result Date: 09/24/2018 CLINICAL DATA:  Cough and fever EXAM: PORTABLE CHEST 1 VIEW COMPARISON:  None. FINDINGS: Cardiac shadow  is within normal limits. The lungs are clear. No bony abnormality is noted. IMPRESSION: No active disease. Electronically Signed  By: Inez Catalina M.D.   On: 09/24/2018 21:00

## 2018-10-07 NOTE — Progress Notes (Signed)
Telephone call to patient's mother, using [employer] interpreter Asencion Partridge, updated on plan of care and answered all questions.

## 2018-10-08 ENCOUNTER — Inpatient Hospital Stay: Payer: Self-pay

## 2018-10-08 LAB — CBC WITH DIFFERENTIAL/PLATELET
Abs Immature Granulocytes: 0.05 10*3/uL (ref 0.00–0.07)
Basophils Absolute: 0 10*3/uL (ref 0.0–0.1)
Basophils Relative: 0 %
Eosinophils Absolute: 0.1 10*3/uL (ref 0.0–0.5)
Eosinophils Relative: 1 %
HCT: 42.1 % (ref 36.0–46.0)
Hemoglobin: 12.9 g/dL (ref 12.0–15.0)
Immature Granulocytes: 1 %
Lymphocytes Relative: 38 %
Lymphs Abs: 2.6 10*3/uL (ref 0.7–4.0)
MCH: 26.3 pg (ref 26.0–34.0)
MCHC: 30.6 g/dL (ref 30.0–36.0)
MCV: 85.9 fL (ref 80.0–100.0)
Monocytes Absolute: 0.4 10*3/uL (ref 0.1–1.0)
Monocytes Relative: 6 %
Neutro Abs: 3.7 10*3/uL (ref 1.7–7.7)
Neutrophils Relative %: 54 %
Platelets: 253 10*3/uL (ref 150–400)
RBC: 4.9 MIL/uL (ref 3.87–5.11)
RDW: 14.1 % (ref 11.5–15.5)
WBC: 6.8 10*3/uL (ref 4.0–10.5)
nRBC: 0 % (ref 0.0–0.2)

## 2018-10-08 LAB — COMPREHENSIVE METABOLIC PANEL
ALT: 32 U/L (ref 0–44)
AST: 12 U/L — ABNORMAL LOW (ref 15–41)
Albumin: 3.2 g/dL — ABNORMAL LOW (ref 3.5–5.0)
Alkaline Phosphatase: 46 U/L (ref 38–126)
Anion gap: 11 (ref 5–15)
BUN: 24 mg/dL — ABNORMAL HIGH (ref 6–20)
CO2: 25 mmol/L (ref 22–32)
Calcium: 8.4 mg/dL — ABNORMAL LOW (ref 8.9–10.3)
Chloride: 104 mmol/L (ref 98–111)
Creatinine, Ser: 0.47 mg/dL (ref 0.44–1.00)
GFR calc Af Amer: >60 mL/min (ref >60)
GFR calc non Af Amer: >60 mL/min (ref >60)
Glucose, Bld: 100 mg/dL — ABNORMAL HIGH (ref 70–99)
Potassium: 3.8 mmol/L (ref 3.5–5.1)
Sodium: 140 mmol/L (ref 135–145)
Total Bilirubin: 0.6 mg/dL (ref 0.3–1.2)
Total Protein: 5.7 g/dL — ABNORMAL LOW (ref 6.5–8.1)

## 2018-10-08 LAB — D-DIMER, QUANTITATIVE: D-Dimer, Quant: 0.33 ug/mL-FEU (ref 0.00–0.50)

## 2018-10-08 LAB — CK: Total CK: 33 U/L — ABNORMAL LOW (ref 38–234)

## 2018-10-08 LAB — TRIGLYCERIDES: Triglycerides: 138 mg/dL (ref <150)

## 2018-10-08 LAB — GLUCOSE, CAPILLARY
Glucose-Capillary: 109 mg/dL — ABNORMAL HIGH (ref 70–99)
Glucose-Capillary: 245 mg/dL — ABNORMAL HIGH (ref 70–99)
Glucose-Capillary: 183 mg/dL — ABNORMAL HIGH (ref 70–99)
Glucose-Capillary: 155 mg/dL — ABNORMAL HIGH (ref 70–99)

## 2018-10-08 LAB — LACTATE DEHYDROGENASE: LDH: 182 U/L (ref 98–192)

## 2018-10-08 LAB — FERRITIN: Ferritin: 74 ng/mL (ref 11–307)

## 2018-10-08 LAB — PHOSPHORUS: Phosphorus: 4.5 mg/dL (ref 2.5–4.6)

## 2018-10-08 LAB — BRAIN NATRIURETIC PEPTIDE: B Natriuretic Peptide: 18.1 pg/mL (ref 0.0–100.0)

## 2018-10-08 LAB — C-REACTIVE PROTEIN: CRP: <0.8 mg/dL (ref <1.0)

## 2018-10-08 LAB — MAGNESIUM: Magnesium: 2.2 mg/dL (ref 1.7–2.4)

## 2018-10-08 MED ORDER — SODIUM CHLORIDE 0.9 % IV BOLUS
500.0000 mL | Freq: Once | INTRAVENOUS | Status: AC
  Administered 2018-10-08: 500 mL via INTRAVENOUS

## 2018-10-08 MED ORDER — METFORMIN HCL 500 MG PO TABS
500.0000 mg | ORAL_TABLET | Freq: Two times a day (BID) | ORAL | Status: DC
  Administered 2018-10-09: 500 mg via ORAL
  Filled 2018-10-08: qty 1

## 2018-10-08 MED ORDER — SODIUM CHLORIDE 0.9 % IV BOLUS
500.0000 mL | Freq: Once | INTRAVENOUS | Status: AC
  Administered 2018-10-08: 18:00:00 500 mL via INTRAVENOUS

## 2018-10-08 MED ORDER — PREDNISONE 20 MG PO TABS
20.0000 mg | ORAL_TABLET | Freq: Every day | ORAL | Status: DC
  Administered 2018-10-09: 20 mg via ORAL
  Filled 2018-10-08: qty 1

## 2018-10-08 NOTE — Progress Notes (Signed)
[place] TEAM 1 - Stepdown/ICU TEAM  Loman ChromanSelene Casillas Parra  ZOX:[id number] DOB: 1991/01/17 DOA: 10/01/2018 PCP: Patient, No Pcp Per    Brief Narrative:  28 y.o.femalewith medical history significantmorbid obesity, [facility], she presented with worsening shortness of breath and was readmitted for further care.   Significant Events: 7/1 > 7/3 admit - tx w/ steroids only 7/8 re-admit - severe hypoxic resp failure   COVID-19 specific Treatment: Remdesivir 7/8 > 7/12 Actemra 7/8 Solumedrol 7/8 >  Subjective: The patient states she is beginning to feel better today.  She complains of some pleuritic type chest pain and some dry coughing.  She denies nausea vomiting or abdominal pain.  She does report some mild dizziness when attempting to stand to ambulate.  Assessment & Plan:  COVID pneumonia - acute hypoxic respiratory failure Continue current care -wean oxygen as able -clinically stabilizing  Recent Labs    10/06/18 0248 10/07/18 0240 10/08/18 0230 10/08/18 0345  DDIMER 0.39  --   --  0.33  FERRITIN 83 79 74  --   LDH  --  206*  --  182  CRP 0.8 <0.8 <0.8  --     Mild transaminitis Resolved  Morbid obesity - Body mass index is 55.21 kg/m.   Newly diagnosed diabetes -uncontrolled - type II A1c at this time is consistent with DM at 6.9 -CBG has been elevated while on steroids -continue sliding scale insulin -begin to educate -can likely be controlled with oral meds when not on steroids -weight loss strongly encouraged  Mild orthostasis Hydrate again today  DVT prophylaxis: Lovenox Code Status: FULL CODE Family Communication:  Disposition Plan: We will continue  Consultants:  none  Antimicrobials:  Azithromycin 7/8 Ceftriaxone 7/8  Objective: Blood pressure 119/70, pulse 70, temperature 98.5 F (36.9 C), temperature source Oral, resp. rate 20, height 5\' 1"  (1.549 m), weight 132.5 kg, SpO2  97 %.  Intake/Output Summary (Last 24 hours) at 10/08/2018 0925 Last data filed at 10/08/2018 0630 Gross per 24 hour  Intake 3093.01 ml  Output 550 ml  Net 2543.01 ml   Filed Weights   10/01/18 1013 10/01/18 2020  Weight: (!) 156 kg 132.5 kg    Examination: General: No acute respiratory distress at rest and chair Lungs: Bibasilar crackles with no wheezing Cardiovascular: Regular rate and rhythm without murmur gallop or rub normal S1 and S2 Abdomen: Nontender, nondistended, soft, bowel sounds positive, no rebound, no ascites, no appreciable mass Extremities: No significant cyanosis, clubbing, or edema bilateral lower extremities  CBC: Recent Labs  Lab 10/01/18 0956  10/06/18 0248 10/07/18 0240 10/08/18 0345  WBC 8.1   < > 5.9 8.3 6.8  NEUTROABS 6.1  --   --  4.9 3.7  HGB 15.2*   < > 13.4 13.8 12.9  HCT 49.0*   < > 43.2 44.8 42.1  MCV 86.7   < > 86.4 85.8 85.9  PLT 256   < > 258 291 253   < > = values in this interval not displayed.   Basic Metabolic Panel: Recent Labs  Lab 10/05/18 0257 10/06/18 0248 10/07/18 0240 10/08/18 0345  NA 143 137 142 140  K 5.9* 5.0 3.8 3.8  CL 107 99 101 104  CO2 15* 27 29 25   GLUCOSE 226* 217* 96 100*  BUN 27* 27* 31* 24*  CREATININE 0.51 0.57 0.54 0.47  CALCIUM 8.8* 8.1* 8.9 8.4*  MG 2.3 2.3 2.5* 2.2  PHOS 4.4 4.2  --  4.5   GFR: Estimated Creatinine Clearance: 136.2 mL/min (by C-G formula based on SCr of 0.47 mg/dL).  Liver Function Tests: Recent Labs  Lab 10/05/18 0257 10/06/18 0248 10/07/18 0240 10/08/18 0345  AST 27 14* 11* 12*  ALT 62* 52* 42 32  ALKPHOS 63 52 50 46  BILITOT 0.4 0.3 0.7 0.6  PROT 6.9 6.5 6.5 5.7*  ALBUMIN 3.5 3.6 3.6 3.2*    Cardiac Enzymes: Recent Labs  Lab 10/08/18 0345  CKTOTAL 33*    HbA1C: Hgb A1c MFr Bld  Date/Time Value Ref Range Status  10/02/2018 11:40 AM 6.9 (H) 4.8 - 5.6 % Final    Comment:    (NOTE)         Prediabetes: 5.7 - 6.4         Diabetes: >6.4         Glycemic  control for adults with diabetes: <7.0     CBG: Recent Labs  Lab 10/07/18 0619 10/07/18 1212 10/07/18 1649 10/07/18 2149 10/08/18 0804  GLUCAP 126* 225* 233* 183* 109*    Recent Results (from the past 240 hour(s))  Blood Culture (routine x 2)     Status: None   Collection Time: 10/01/18 12:30 PM   Specimen: BLOOD  Result Value Ref Range Status   Specimen Description   Final    BLOOD RIGHT ARM Performed at [facility], [place] [address] [place], [place] [address]    Special Requests   Final    BOTTLES DRAWN AEROBIC AND ANAEROBIC Blood Culture adequate volume Performed at [facility] [address], [place], [place] [address]    Culture   Final    NO GROWTH 5 DAYS Performed at [facility], [place] [address] [place], [place] [address]    Report Status 10/06/2018 FINAL  Final     Scheduled Meds: . dextromethorphan-guaiFENesin  1 tablet Oral BID  . enoxaparin (LOVENOX) injection  40 mg Subcutaneous Q12H  . insulin aspart  0-15 Units Subcutaneous TID WC  . insulin aspart  0-5 Units Subcutaneous QHS  . Ipratropium-Albuterol  1 puff Inhalation Q6H  . methylPREDNISolone (SOLU-MEDROL) injection  30 mg Intravenous Daily  . vitamin C  500 mg Oral Daily  . zinc sulfate  220 mg Oral Daily     LOS: 7 days   Cherene Altes, MD [facility] Office  [phone] Pager - Text Page per Amion  If 7PM-7AM, please contact night-coverage per Amion 10/08/2018, 9:25 AM

## 2018-10-08 NOTE — Progress Notes (Signed)
Called pt's mother, Raquel, using phone [place] [phone]. Discussed pt's plan of care at this time. Educated pt's mother on plan of care once pt goes home. Provided address of hospital to pt's mother. Denies further questions.

## 2018-10-09 ENCOUNTER — Inpatient Hospital Stay (HOSPITAL_COMMUNITY): Payer: Medicaid Other

## 2018-10-09 LAB — D-DIMER, QUANTITATIVE: D-Dimer, Quant: 0.31 ug/mL-FEU (ref 0.00–0.50)

## 2018-10-09 LAB — COMPREHENSIVE METABOLIC PANEL
ALT: 30 U/L (ref 0–44)
AST: 12 U/L — ABNORMAL LOW (ref 15–41)
Albumin: 3.3 g/dL — ABNORMAL LOW (ref 3.5–5.0)
Alkaline Phosphatase: 45 U/L (ref 38–126)
Anion gap: 12 (ref 5–15)
BUN: 23 mg/dL — ABNORMAL HIGH (ref 6–20)
CO2: 25 mmol/L (ref 22–32)
Calcium: 8.4 mg/dL — ABNORMAL LOW (ref 8.9–10.3)
Chloride: 103 mmol/L (ref 98–111)
Creatinine, Ser: 0.39 mg/dL — ABNORMAL LOW (ref 0.44–1.00)
GFR calc Af Amer: >60 mL/min (ref >60)
GFR calc non Af Amer: >60 mL/min (ref >60)
Glucose, Bld: 106 mg/dL — ABNORMAL HIGH (ref 70–99)
Potassium: 3.9 mmol/L (ref 3.5–5.1)
Sodium: 140 mmol/L (ref 135–145)
Total Bilirubin: 0.6 mg/dL (ref 0.3–1.2)
Total Protein: 5.9 g/dL — ABNORMAL LOW (ref 6.5–8.1)

## 2018-10-09 LAB — CBC WITH DIFFERENTIAL/PLATELET
Abs Immature Granulocytes: 0.03 10*3/uL (ref 0.00–0.07)
Basophils Absolute: 0 10*3/uL (ref 0.0–0.1)
Basophils Relative: 0 %
Eosinophils Absolute: 0.1 10*3/uL (ref 0.0–0.5)
Eosinophils Relative: 1 %
HCT: 41 % (ref 36.0–46.0)
Hemoglobin: 12.9 g/dL (ref 12.0–15.0)
Immature Granulocytes: 0 %
Lymphocytes Relative: 30 %
Lymphs Abs: 2.1 10*3/uL (ref 0.7–4.0)
MCH: 26.9 pg (ref 26.0–34.0)
MCHC: 31.5 g/dL (ref 30.0–36.0)
MCV: 85.4 fL (ref 80.0–100.0)
Monocytes Absolute: 0.5 10*3/uL (ref 0.1–1.0)
Monocytes Relative: 6 %
Neutro Abs: 4.4 10*3/uL (ref 1.7–7.7)
Neutrophils Relative %: 63 %
Platelets: 264 10*3/uL (ref 150–400)
RBC: 4.8 MIL/uL (ref 3.87–5.11)
RDW: 14 % (ref 11.5–15.5)
WBC: 7.1 10*3/uL (ref 4.0–10.5)
nRBC: 0 % (ref 0.0–0.2)

## 2018-10-09 LAB — C-REACTIVE PROTEIN: CRP: <0.8 mg/dL (ref <1.0)

## 2018-10-09 LAB — INTERLEUKIN-6, PLASMA: Interleukin-6, Plasma: 96.4 pg/mL — ABNORMAL HIGH (ref 0.0–12.2)

## 2018-10-09 LAB — GLUCOSE, CAPILLARY
Glucose-Capillary: 108 mg/dL — ABNORMAL HIGH (ref 70–99)
Glucose-Capillary: 131 mg/dL — ABNORMAL HIGH (ref 70–99)

## 2018-10-09 LAB — FERRITIN: Ferritin: 183 ng/mL (ref 11–307)

## 2018-10-09 MED ORDER — LIVING WELL WITH DIABETES BOOK - IN SPANISH
Freq: Once | Status: AC
  Administered 2018-10-09: 18:00:00
  Filled 2018-10-09: qty 1

## 2018-10-09 MED ORDER — PREDNISONE 10 MG PO TABS: ORAL_TABLET | ORAL | 0 refills | Status: AC

## 2018-10-09 MED ORDER — SODIUM CHLORIDE 0.9 % IV BOLUS
250.0000 mL | Freq: Once | INTRAVENOUS | Status: AC
  Administered 2018-10-09: 04:00:00 250 mL via INTRAVENOUS

## 2018-10-09 MED ORDER — METFORMIN HCL 500 MG PO TABS: 500.0000 mg | ORAL_TABLET | Freq: Two times a day (BID) | ORAL | 0 refills | Status: DC

## 2018-10-09 MED ORDER — ACETAMINOPHEN 325 MG PO TABS: 650.0000 mg | ORAL_TABLET | Freq: Four times a day (QID) | ORAL | Status: AC | PRN

## 2018-10-09 NOTE — TOC Transition Note (Signed)
Transition of Care [facility]) - CM/SW Discharge Note   Patient Details  Name: Karen Barajas MRN: [id number] Date of Birth: Sep 10, 1990  Transition of Care [facility]) CM/SW Contact:  Ninfa Meeker, RN Phone Number: [phone] (working remotely) 10/09/2018, 3:38 PM   Clinical Narrative:  28yr old female admitted and treated for COVID.Thankfully patient is improving and will discharge home. Referral for Oxygen called to Learta Codding, Liaison with [facility]. Case manager  [facility] Clarissa and requested a tank be delivered to patient's room. Patient will discharge home when concentrator and tanks have been delivered to her home.        Final next level of care: Home/Self Care Barriers to Discharge: No Barriers Identified   Patient Goals and CMS Choice     Choice offered to / list presented to : Patient, NA  Discharge Placement:                       Discharge Plan and Services In-house Referral: NA Discharge Planning Services: CM Consult, Follow-up appt scheduled            DME Arranged: Oxygen DME Agency: [place] Date DME Agency Contacted: 10/09/18 Time DME Agency Contacted: 0940 Representative spoke with at DME Agency: Learta Codding [facility] Arranged: NA          Social Determinants of Health ([place]) Interventions     Readmission Risk Interventions No flowsheet data found.

## 2018-10-09 NOTE — Discharge Summary (Signed)
DISCHARGE SUMMARY  Karen ChromanSelene Casillas Barajas  MR#: [id number]  DOB:[birth year]  Date of Admission: 10/01/2018 Date of Discharge: 10/09/2018  Attending Physician:Marquis Down Silvestre Gunner Tryone Kille, MD  Patient's ZMO:[id number]:Patient, No Pcp Per  Consults: none   Disposition: D/C home   Follow-up Appts: Follow-up Information     [facility] Follow up.   Why: You are sheduled for a telephone follow up appointment on 10/15/18, please be available  Contact information: [address] [facility] [place] [id number] [phone]          Tests Needing Follow-up: -determine if home O2 support is still required -follow up on newly diagnosed DM - consider repeat testing when off steroids - see discussion below   COVID-19 specific Treatment: Remdesivir 7/8 > 7/12 Actemra 7/8 Solumedrol > Prednisone   Discharge Diagnoses: COVID pneumonia Acute hypoxic respiratory failure Mild transaminitis Morbid obesity - Body mass index is 55.21 kg/m.  Possible newly diagnosed steroid induced DM - uncontrolled - type II Mild orthostasis - Dehydration   Initial presentation: 28 y.o.femalewith medical history significantfor morbid obesity who [facility] with a diagnosis of COVID-19 PNA which required only steroid tx. She returned to the ED 10/01/18 with worsening shortness of breath for 48hrs, and was readmitted for further care after she was found to have a sat of 82% on RA.   Hospital Course:  COVID pneumonia - acute hypoxic respiratory failure The patient was admitted to the acute units -oxygen was administered and titrated as necessary to keep saturations 88% or greater -she was treated with steroids Remdesivir and Actemra -she completed a full 5-day course of Remdesivir -fortunately she improved nicely with this intervention -she was able to be weaned back to room air when at rest -she still requires 2 L during exertion but it is likely this  will improve quickly -she is stable for discharge home 10/09/18 and agrees with her physician that she is safe to go home  Mild transaminitis Resolved -likely related to Remdesivir/Actemra  Morbid obesity - Body mass index is 55.21 kg/m.  Patient counseled on advisability of weight loss  Possible newly diagnosed steroid induced DM - uncontrolled - type II A1c at this time is consistent with DM at 6.9 -CBG has been elevated while on steroids -utilize sliding scale insulin during the hospital stay-begin to educate -weight loss encouraged -given that this patient has been on steroids for a prolonged period of time including prior to her A1c testing I am hesitant to make a formal diagnosis of diabetes at this time -and that the patient is being discharged on steroids I have written her prescription for Glucophage -I had discussed with her the the possibility that she may in fact have diabetes -she confirms that both her grandmother and grandfather had diabetes but she has never been told that she has diabetes -I feel that it would be most prudent to recheck an A1c after the patient has fully recovered from her COVID and has had at least a month with no steroid therapy before making a formal lasting diagnosis of diabetes  Mild orthostasis Due to dehydration from viral illness -resolved with simple volume expansion   Allergies as of 10/09/2018      Reactions   Flexeril [cyclobenzaprine] Itching   Itchy dry throat, chest tightness      Medication List    TAKE these medications   acetaminophen 325 MG tablet Commonly known as: TYLENOL Take 2 tablets (650 mg total) by mouth  every 6 (six) hours as needed for mild pain or headache (fever >/= 101). What changed:   how much to take  reasons to take this   guaiFENesin-dextromethorphan 100-10 MG/5ML syrup Commonly known as: ROBITUSSIN DM Take 10 mLs by mouth every 4 (four) hours as needed for cough.   metFORMIN 500 MG tablet Commonly known  as: GLUCOPHAGE Take 1 tablet (500 mg total) by mouth 2 (two) times daily with a meal.   predniSONE 10 MG tablet Commonly known as: DELTASONE Take 2 tablets (20 mg total) by mouth daily with breakfast for 3 days, THEN 1 tablet (10 mg total) daily with breakfast for 3 days. Start taking on: October 10, 2018 What changed:   medication strength  See the new instructions.            Durable Medical Equipment  (From admission, onward)         Start     Ordered   10/09/18 1214  For home use only DME oxygen  Once    Question Answer Comment  Length of Need 6 Months   Mode or (Route) Nasal cannula   Liters per Minute 2   Oxygen conserving device Yes   Oxygen delivery system Gas      10/09/18 1214          Day of Discharge BP 98/65 (BP Location: Right Arm)   Pulse 63   Temp 98.1 F (36.7 C) (Oral)   Resp 15   Ht 5\' 1"  (1.549 m)   Wt 132.5 kg   SpO2 93%   BMI 55.21 kg/m   Physical Exam: General: No acute respiratory distress Lungs: Clear to auscultation bilaterally without wheezes or crackles Cardiovascular: Regular rate and rhythm without murmur  Abdomen: Nontender, nondistended, soft, bowel sounds positive, no rebound, no ascites, no appreciable mass Extremities: No significant edema bilateral lower extremities  Basic Metabolic Panel: Recent Labs  Lab 10/03/18 0500 10/04/18 0445 10/05/18 0257 10/06/18 0248 10/07/18 0240 10/08/18 0345 10/09/18 0345  NA 137 140 143 137 142 140 140  K 4.8 4.5 5.9* 5.0 3.8 3.8 3.9  CL 99 101 107 99 101 104 103  CO2 26 28 15* 27 29 25 25   GLUCOSE 201* 213* 226* 217* 96 100* 106*  BUN 23* 24* 27* 27* 31* 24* 23*  CREATININE 0.39* 0.53 0.51 0.57 0.54 0.47 0.39*  CALCIUM 8.8* 8.6* 8.8* 8.1* 8.9 8.4* 8.4*  MG 2.3 2.2 2.3 2.3 2.5* 2.2  --   PHOS 4.3 5.3* 4.4 4.2  --  4.5  --     Liver Function Tests: Recent Labs  Lab 10/05/18 0257 10/06/18 0248 10/07/18 0240 10/08/18 0345 10/09/18 0345  AST 27 14* 11* 12* 12*  ALT 62*  52* 42 32 30  ALKPHOS 63 52 50 46 45  BILITOT 0.4 0.3 0.7 0.6 0.6  PROT 6.9 6.5 6.5 5.7* 5.9*  ALBUMIN 3.5 3.6 3.6 3.2* 3.3*   CBC: Recent Labs  Lab 10/05/18 0520 10/06/18 0248 10/07/18 0240 10/08/18 0345 10/09/18 0345  WBC 4.6 5.9 8.3 6.8 7.1  NEUTROABS  --   --  4.9 3.7 4.4  HGB 13.1 13.4 13.8 12.9 12.9  HCT 43.1 43.2 44.8 42.1 41.0  MCV 86.0 86.4 85.8 85.9 85.4  PLT 266 258 291 253 264    CBG: Recent Labs  Lab 10/08/18 1124 10/08/18 1659 10/08/18 2117 10/09/18 0753 10/09/18 1148  GLUCAP 245* 183* 155* 108* 131*    Recent Results (from the past 240 hour(s))  Blood Culture (routine x 2)     Status: None   Collection Time: 10/01/18 12:30 PM   Specimen: BLOOD  Result Value Ref Range Status   Specimen Description   Final    BLOOD RIGHT ARM Performed at [facility] [address], [place], [place] [address]    Special Requests   Final    BOTTLES DRAWN AEROBIC AND ANAEROBIC Blood Culture adequate volume Performed at [facility] [address], [place], [place] [address]    Culture   Final    NO GROWTH 5 DAYS Performed at [facility], [place] [address] [place], [place] [address]    Report Status 10/06/2018 FINAL  Final  Culture, blood (Routine X 2) w Reflex to ID Panel     Status: None (Preliminary result)   Collection Time: 10/07/18  4:34 PM   Specimen: BLOOD  Result Value Ref Range Status   Specimen Description   Final    BLOOD LEFT ANTECUBITAL Performed at [facility] [address], [place], [place] [address]    Special Requests   Final    BOTTLES DRAWN AEROBIC AND ANAEROBIC Blood Culture adequate volume Performed at [facility] [address], [place], [place] [address]    Culture   Final    NO GROWTH < 24 HOURS Performed at [facility] [address] [place], [place] [address]    Report Status PENDING  Incomplete  Culture, blood (Routine X 2) w Reflex  to ID Panel     Status: None (Preliminary result)   Collection Time: 10/07/18  8:00 PM   Specimen: BLOOD  Result Value Ref Range Status   Specimen Description   Final    BLOOD BLOOD LEFT FOREARM Performed at [facility] [address], [place], [place] [address]    Special Requests   Final    BOTTLES DRAWN AEROBIC AND ANAEROBIC Blood Culture adequate volume Performed at [facility] [address] [place], [place] [address]    Culture   Final    NO GROWTH < 24 HOURS Performed at [facility] [address] [place],  [address]    Report Status PENDING  Incomplete     Time spent in discharge (includes decision making & examination of pt): 35 minutes  10/09/2018, 1:48 PM   Cherene Altes, MD [facility] Office  [phone]

## 2018-10-09 NOTE — Discharge Instructions (Signed)
NO PUEDE REGRESAR AL TRABAJO O SALIR EN PBLICO HASTA:  No ha tenido [facility] al menos 72 horas (es Software engineer, tres das completos sin fiebre sin el uso de medicamentos que reducen la [place]) Y Otros sntomas han mejorado (por ejemplo, cuando la tos o la falta de [place] o la diarrea han mejorado) Y Han transcurrido al menos 10 das desde que aparecieron sus sntomas por primera vez    COVID-19 COVID-19 La COVID-19 es una infeccin respiratoria causada por un virus llamado coronavirus tipo 2 causante del sndrome respiratorio agudo grave (SARS-CoV-2). La enfermedad tambin se conoce como enfermedad por coronavirus o nuevo coronavirus. En algunas personas, el virus puede no ocasionar sntomas. En otras, puede producir una infeccin grave. La infeccin puede empeorar rpidamente y causar complicaciones, como:  Neumona o infeccin en los pulmones.  Sndrome de dificultad respiratoria aguda o SDRA. Se trata de la acumulacin de lquido en los pulmones.  Insuficiencia respiratoria aguda. Se trata de una afeccin en la que no pasa suficiente oxgeno de los pulmones al cuerpo.  Sepsis o choque sptico. Se trata de una reaccin grave del cuerpo ante una infeccin.  Problemas de coagulacin.  Infecciones secundarias debido a bacterias u hongos. El virus que causa la COVID-19 es contagioso. Esto significa que puede transmitirse de [place] persona a otra a travs de las gotitas de saliva de la tos y de los estornudos (secreciones respiratorias). Cules son las causas? Esta enfermedad es causada por un virus. Usted puede contagiarse con este virus:  Al aspirar las gotitas que una persona infectada elimina al toser o Brewing technologist.  Al tocar algo, como una mesa o el picaportes de [place], que estuvo expuesto al virus (contaminado) y luego tocarse la boca, nariz o los ojos. Qu incrementa el riesgo? Riesgo de infeccin Es ms probable que se infecte con este virus si:  Vive o viaja a una zona  donde hay un brote de COVID-19.  Rodman Comp en contacto con una persona enferma que recientemente viaj a una zona con un brote de COVID-19.  Cuida o vive con una persona infectada con COVID-19. Riesgo de enfermedad grave Es ms probable que se enferme gravemente por el virus si:  Tiene 65aos o ms.  Tiene una enfermedad crnica que disminuye la capacidad del cuerpo para combatir las infecciones (immunocomprometido).  Vive en un hogar de ancianos o centro de atencin a Barrister's clerk.  Tiene una enfermedad prolongada (crnica), como las siguientes: ? Enfermedad pulmonar crnica, que incluye la enfermedad pulmonar obstructiva crnica o asma. ? Enfermedad cardaca. ? Diabetes. ? Enfermedad renal crnica. ? Enfermedad heptica.  Es obeso. Cules son los signos o sntomas? Los sntomas de esta afeccin pueden ser de leves a graves. Los sntomas pueden aparecer en el trmino de 2 a [address] despus de haber estado expuesto al virus. Incluyen los siguientes:  [place].  Tos.  Dificultad para respirar.  Escalofros.  Dolores musculares.  Dolor de Investment banker, operational.  Prdida del gusto o Armed forces logistics/support/administrative officer. Algunas personas tambin pueden Frontier Oil Corporation, como nuseas, vmitos o diarrea. Es posible que otras personas no tengan sntomas de COVID-19. Cmo se diagnostica? Esta afeccin se puede diagnosticar en funcin de lo siguiente:  Sus signos y sntomas, especialmente si: ? Vive en una zona donde hay un brote de COVID-19. ? Viaj recientemente a una zona donde el virus es frecuente. ? Cuida o vive con Ardelia Mems persona a quien se le diagnostic COVID-19.  Un examen fsico.  Anlisis de laboratorio que pueden incluir: ?  Un hisopado nasal para tomar [place] de lquido de la nariz. ? Un hisopado de garganta para tomar Truddie Coco de lquido de la garganta. ? Una muestra de mucosidad de los pulmones (esputo). ? Anlisis de [place].  Los estudios de diagnstico por imgenes pueden incluir  radiografas, exploracin por tomografa computarizada (TC) o ecografa. Cmo se trata? En este momento, no hay ningn medicamento para tratar la COVID-19. Los medicamentos para tratar otras enfermedades se usan a modo de ensayo para comprobar si son eficaces contra la COVID-19. El mdico le informar sobre las maneras de tratar los sntomas. En la [employer], la infeccin es leve y puede controlarse en el hogar con reposo, lquidos y medicamentos de [place]. El tratamiento para una infeccin grave suele realizarse en la unidad de cuidados intensivos (UCI) de un hospital. Puede incluir uno o ms de los siguientes. Estos tratamientos se administran hasta que los sntomas mejoran.  Recibir lquidos y [employer] a travs de una va intravenosa.  Oxgeno complementario. Para administrar oxgeno extra, se [place] un tubo en la Doran Durand, una mascarilla o una campana de oxgeno.  Colocarlo para que se recueste boca abajo (decbito prono). Esto facilita el ingreso de oxgeno a los pulmones.  Uso continuo de [place] de presin positiva de las vas areas (CPAP) o de presin positiva de las vas areas de dos niveles (BPAP). Este tratamiento utiliza una presin de aire leve para Theatre manager las vas respiratorias abiertas. Un tubo conectado a un motor administra oxgeno al cuerpo.  Respirador. Este tratamiento mueve el aire dentro y fuera de los pulmones mediante el uso de un tubo que se coloca en la trquea.  Traqueostoma. En este procedimiento se hace un orificio en el cuello para insertar un tubo de respiracin.  Oxigenacin por membrana extracorprea (OMEC). En este procedimiento, los pulmones tienen la posibilidad de recuperarse al asumir las funciones del corazn y los pulmones. Suministra oxgeno al cuerpo y elimina el dixido de carbono. Siga estas instrucciones en su casa: Estilo de vida  Si est enfermo, qudese en su casa, excepto para obtener atencin mdica. El mdico le  indicar cunto tiempo debe quedarse en casa. Llame al mdico antes de buscar atencin mdica.  Haga reposo en su casa como se lo haya indicado el mdico.  No consuma ningn producto que contenga nicotina o tabaco, como cigarrillos, cigarrillos electrnicos y tabaco de Higher education careers adviser. Si necesita ayuda para dejar de fumar, consulte al mdico.  Retome sus actividades normales como se lo haya indicado el mdico. Pregntele al mdico qu actividades son seguras para usted. Instrucciones generales  Use los medicamentos de venta libre y los recetados solamente como se lo haya indicado el mdico.  Beba suficiente lquido como para Theatre manager la orina de color amarillo plido.  Concurra a todas las visitas de seguimiento como se lo haya indicado el mdico. Esto es importante. Cmo se evita?  No hay ninguna vacuna que ayude a prevenir la infeccin por la COVID-19. Sin embargo, hay medidas que puede tomar para protegerse y Museum/gallery curator a Producer, television/film/video de este virus. Para protegerse:   No viaje a zonas donde la COVID-19 sea un riesgo. Las zonas donde se informa la presencia de la COVID-19 [place] con frecuencia. Para identificar las zonas de alto riesgo y las restricciones de viaje, consulte el sitio web de viajes de Garment/textile technologist for [employer] and Prevention Librarian, academic) (Centros para Building surveyor y la Prevencin de Arboriculturist): FatFares.[email or web address].br  Si vive o debe viajar a Furniture conservator/restorer  zona donde COVID-19 es un riesgo, tome precauciones para evitar infecciones. ? Aljese de [place]. ? Lvese las manos frecuentemente con agua y [place]. Use desinfectante para manos con alcohol si no dispone de [place] y [place]. ? Evite tocarse la boca, la cara, los ojos o la [place]. ? Evite salir de su casa, siga las indicaciones de su estado y [place] autoridades sanitarias locales. ? Si debe salir de su casa, use un barbijo de tela o una mascarilla facial. ? Desinfecte los objetos y las superficies que  se tocan con frecuencia todos [place]. Pueden incluir:  Encimeras y [place].  Picaportes e interruptores de luz.  Lavabos, fregaderos y grifos.  Aparatos electrnicos tales como telfonos, controles remotos, teclados, computadoras y tabletas. Cmo proteger a los dems: Si tiene sntomas de la COVID-19, tome medidas para evitar que el virus se propague a Producer, television/film/video.  Si cree que tiene una infeccin por la COVID-19, comunquese de inmediato con su mdico. Informe al equipo de atencin mdica que cree que puede tener una infeccin por la COVID-19.  Qudese en su casa. Salga de su casa solo para buscar atencin mdica. No utilice el transporte pblico.  No viaje mientras est enfermo.  Lvese las manos frecuentemente con agua y [place]. Usar desinfectante para manos con alcohol si no dispone de [place] y [place].  Mantngase alejado de quienes vivan con usted. Permita que los miembros de la familia sanos cuiden a los nios y las [place], si es posible. Si tiene que cuidar a los nios o las mascotas, lvese las manos con frecuencia y use un barbijo. Si es posible, permanezca en su habitacin, separado de los dems. Utilice un bao diferente.  Asegrese de que todas las personas que viven en su casa se laven bien las manos y con frecuencia.  Tosa o estornude en un pauelo de papel o sobre su manga o codo. No tosa o estornude al aire ni se cubra la boca o la nariz con la [place].  Use un barbijo de tela o una mascarilla facial. Dnde buscar ms informacin  Centers for Disease Control and Prevention (Centros para el Control y la Prevencin de Arboriculturist): PurpleGadgets.be  World Health Organization (Organizacin Mundial de la Salud): https://www.castaneda.info/ Comunquese con un mdico si:  Vive o ha viajado a una zona donde la COVID-19 es un riesgo y tiene sntomas de infeccin.  Ha tenido contacto con alguien que tiene COVID-19 y  usted tiene sntomas de infeccin. Solicite ayuda de inmediato si:  Tiene dificultad para respirar.  Siente dolor u opresin en el pecho.  Experimenta confusin.  [place] uas de los dedos y los labios de color [place].  Tiene dificultad para despertarse.  Los sntomas empeoran. Estos sntomas pueden representar un problema grave que constituye Engineer, maintenance (IT). No espere a ver si los sntomas desaparecen. Solicite atencin mdica de inmediato. Comunquese con el servicio de emergencias de su localidad (911 en los [place]). No conduzca por sus propios medios [employer] hospital. Informe al personal mdico de emergencias si cree que tiene COVID-19. Resumen  La COVID-19 es una infeccin respiratoria causada por un virus. Tambin se conoce como enfermedad por coronavirus o nuevo coronavirus. Puede causar infecciones graves, como neumona, sndrome de dificultad respiratoria aguda, insuficiencia respiratoria aguda o sepsis.  El virus que causa la COVID-19 es contagioso. Esto significa que puede transmitirse de [place] persona a otra a travs de las gotitas de saliva de la tos y [place]  estornudos.  Es ms probable que desarrolle una enfermedad grave si tiene 65 aos o ms, tiene un sistema inmunitario dbil, vive en un hogar de ancianos o tiene enfermedad crnica.  No hay ningn medicamento para tratar la COVID-19. El mdico le informar sobre las maneras de tratar los sntomas.  Tome medidas para protegerse y Conservator, museum/gallery a los Merchandiser, retail las infecciones. Lvese las manos con frecuencia y desinfecte los objetos y las superficies que se tocan con frecuencia todos [place]. Mantngase alejado de las personas que estn enfermas y use un barbijo si est enfermo. Esta informacin no tiene Theme park manager el consejo del mdico. Asegrese de hacerle al mdico cualquier pregunta que tenga. Document Released: 05/10/2018 Document Revised: 08/12/2018 Document Reviewed: 05/10/2018 Elsevier Patient  Education  2020 Elsevier Inc.   COVID-19: Cmo protegerse y proteger a los dems COVID-19: How to Protect Yourself and Others Sepa cmo se propaga  Actualmente, no existe ninguna vacuna para prevenir la enfermedad por coronavirus 2019 (COVID-19).  La mejor forma de prevenir la enfermedad es evitar exponerse a este virus.  Se cree que el virus se transmite principalmente de [place] persona a [place]. ? Air Products and Chemicals que estn en contacto directo entre s (a una distancia inferior a 6 pies [1.48m]). ? A travs de las gotitas respiratorias producidas cuando una persona infectada tose, estornuda o habla. ? Estas gotitas pueden caer en la boca o en la nariz de las personas que estn cerca o pueden ser [employer] Company pulmones. ? Algunos estudios recientes sugieren que la COVID-19 puede ser transmitida por personas que no presentan sntomas. Lo que todos deben hacer Public Service Enterprise Group las manos con frecuencia  Lvese las manos con frecuencia con agua y jabn durante al menos 20 segundos, especialmente despus de Oceanographer en un lugar pblico o despus de sonarse la nariz, toser o estornudar.  Si no dispone de [place] y [place], use un desinfectante de manos que contenga al menos un 60% de alcohol. Cubra todas las superficies de las manos y frtelas hasta que se sientan secas.  No se toque los ojos, la nariz y la boca sin antes lavarse las manos. Evite el contacto cercano  Qudese en casa si est enfermo.  Evite el contacto cercano con personas que estn enfermas.  Establezca distancia entre usted y N[employer] Corporation. ? Recuerde que Micron Technology no tienen sntomas pueden transmitir el virus. ? Esto es Software engineer para las personas que tienen ms riesgo de [email or web address] Cbrase la boca y la nariz con un barbijo de tela cuando est cerca de otras personas  Puede transmitir la COVID-19 a Metallurgist aunque no se sienta enfermo.  Todas las personas deben usar un barbijo de tela cuando tengan que ir a un lugar pblico, por ejemplo, al supermercado o a buscar otros productos necesarios. ? Los barbijos de tela no deben colocarse a nios menores de 2 aos de [place], a las personas que tienen problemas respiratorios o que estn inconscientes, incapacitadas o que por algn motivo no puedan quitarse la mascarilla sin [place].  El propsito del barbijo de tela es proteger a Economist en caso de que usted est infectado.  NO utilice las Gannett Co a los trabajadores de [employer] Homes.  Contine manteniendo una distancia aproximada de 6 pies (1.39m) entre usted y N[employer] Corporation. El barbijo de tela no reemplaza el distanciamiento social. Susa Loffler al toser y estornudar  Si est en un ambiente privado y no tiene el barbijo de tela, recuerde  siempre cubrirse la boca y la Darene Lamernariz con un pauelo descartable al toser o Engineering geologistestornudar, o usar el pliegue del codo.  Deseche los pauelos descartables usados en la basura.  Inmediatamente, lvese las manos con agua y [place]jabn durante al menos 20segundos. Si no dispone de agua y Belarusjabn, Land O'Lakeslmpiese las manos con un desinfectante de manos que contenga al menos un 60% de alcohol. [place] y desinfecte  [place]pie Y desinfecte las superficies que se tocan con frecuencia todos los [address]. Esto incluye mesas, picaportes, interruptores de luz, encimeras, mangos, escritorios, telfonos, teclados, inodoros, grifos y lavabos. ktimeonline.comwww.cdc.gov/coronavirus/2019-ncov/prevent-getting-sick/disinfecting-your-home.html  Si las superficies estn sucias, lmpielas: Use detergente o jabn y agua antes de la desinfeccin.  Luego, use un desinfectante domstico. Puede consultar una lista de los desinfectantes domsticos registrados en Financial controllerla Environmental Protection Agency (EPA) (Agencia de Proteccin Ambiental) aqu. SouthAmericaFlowers.co.ukcdc.gov/coronavirus 07/29/2018 Esta informacin no tiene Microbiologistcomo fin  reemplazar el consejo del mdico. Asegrese de hacerle al mdico cualquier pregunta que tenga. Document Released: 07/17/2018 Document Revised: 08/12/2018 Document Reviewed: 07/08/2018 Elsevier Patient Education  2020 Elsevier Inc.    Preguntas frecuentes sobre el COVID-19 COVID-19 Frequently Asked Questions El COVID-19 (enfermedad por coronavirus) es una infeccin causada por una gran familia de virus. Algunos virus causan National Cityenfermedades en las personas y otros causan enfermedades en animales tales como los camellos, los gatos y los murcilagos. En algunos casos, los virus que causan New York Life Insuranceenfermedades en los animales pueden transmitirse a los seres humanos. De dnde provino el coronavirus? En diciembre de 2019, Armeniahina le inform a la Organizacin Mundial de la Salud (OMS) acerca de varios casos de enfermedad pulmonar (enfermedad respiratoria humana). Estos casos estaban vinculados con un mercado abierto de frutos de mar y Germanyganado en la ciudad de [place]. El vnculo con el mercado de ganado y Liberty Globalmariscos sugiere que el virus puede haberse propagado de los animales a los Cornwall Bridgehumanos. Sin embargo, desde Chiropodistese primer brote en diciembre, tambin se ha demostrado que el virus se contagia de [place] Catharineuna persona a Educational psychologistotra. Cul es el nombre de la enfermedad y del virus? Nombre de la enfermedad Al principio, esta enfermedad se llam nuevo coronavirus. Esto se debe a que los cientficos determinaron que la enfermedad era causada por un nuevo virus respiratorio. La Organizacin Mundial de la Salud (OMS) ahora ha dado a la enfermedad el nombre de COVID-19, o enfermedad por coronavirus. Nombre del virus El virus causante de la enfermedad se conoce como coronavirus de tipo 2 causante del sndrome respiratorio agudo grave (SARS-CoV-2). Ms informacin sobre el nombre de la enfermedad y el virus Organizacin Mundial de la WrightSalud (OMS):  www.who.int/emergencies/diseases/novel-coronavirus-2019/technical-guidance/naming-the-coronavirus-disease-(covid-2019)-and-the-virus-that-causes-it Quines estn en riesgo de sufrir complicaciones debido a la enfermedad por coronavirus? Algunas personas pueden tener un riesgo ms alto de tener complicaciones debido a la enfermedad por coronavirus. Entre ellas se encuentran los ONEOKadultos mayores y las personas que tienen enfermedades crnicas, como enfermedad cardaca, diabetes y enfermedad pulmonar. Si tiene un riesgo ms alto de Sales executivetener complicaciones, tome estas precauciones adicionales:  Recruitment consultantvitar el contacto cercano con personas que estn enfermas o que tengan fiebre o tos. Permanecer al menos a una distancia de 3 a 6 pies (1-5861m) de las Nucor Corporationotras personas, si es posible.  Lavarse las manos regularmente con agua y jabn durante al menos 20segundos.  Evitar tocarse la cara, la boca, la nariz y los ojos.  Tener a H. J. Heinzmano los suministros en su casa, como alimentos, medicamentos y productos de limpieza.  Permanecer en su casa todo lo que sea posible.  Evitar las reuniones Anchoragesociales y  los viajes. Cmo se transmite la enfermedad causada por el coronavirus? El virus que causa la enfermedad por coronavirus se transmite fcilmente de Neomia Dear persona a otra (es contagioso). Tambin hay casos de enfermedad de transmisin comunitaria. Esto significa que la enfermedad se ha propagado a:  Personas que no tienen contacto conocido con Pharmacist, community.  Personas que no han viajado a zonas donde hay casos conocidos. Aparentemente, se transmite de [place] persona a otra a travs de las [employer]ands se despiden al toser o al estornudar. Puedo contraer al virus al tocar superficies u objetos? Todava hay mucho que no se conoce acerca del virus que causa la enfermedad por coronavirus. Los cientficos basan gran parte de la informacin en lo que saben sobre virus similares, por ejemplo:  En general, los virus no  sobreviven en superficies durante mucho tiempo. Necesitan un cuerpo humano (husped) para sobrevivir.  Es ms probable que el virus se contagie por contacto cercano con personas que estn enfermas (contacto directo), por ejemplo: ? Al estrechar las manos o abrazarse. ? Al inhalar las gotitas respiratorias que se desplazan por el aire. Esto puede ocurrir cuando una persona infectada tose o estornuda sobre o cerca de Economist.  Es menos probable que el virus se propague cuando una persona toca una superficie o un objeto sobre el que est el virus (contacto indirecto). El virus puede ingresar al cuerpo si la persona toca una superficie o un objeto y Express Scripts se toca la cara, los ojos, la nariz o la boca. Una persona puede contagiar el virus sin tener sntomas de la enfermedad? Puede ser posible que el virus se contagie antes de que la persona tenga sntomas de la enfermedad, pero muy probablemente esta no sea la principal forma en que el virus se est propagando. Es ms probable que el virus se propague al estar en contacto directo con personas que estn enfermas e inhalar las gotas respiratorias que una persona enferma despide al toser o Engineering geologist. Cules son los sntomas de la enfermedad causada por el coronavirus? Los sntomas varan de Neomia Dear persona a otra y pueden variar de leves a graves. Hershey Company, se pueden incluir los siguientes:  [place].  Tos.  Cansancio, debilidad o fatiga.  Respiracin rpida o sensacin de falta el aire. Estos sntomas pueden aparecer en el trmino de 2 a [address] despus de haber estado expuesto al virus. Si presenta sntomas, llame al mdico. Las personas con sntomas graves pueden necesitar atencin hospitalaria. Si estoy expuesto al virus, cunto tiempo tardan en aparecer los sntomas? Los sntomas de la enfermedad por coronavirus Magazine features editor en el trmino de 2 a 14 das despus de que una persona haya estado expuesta al virus.  Si presenta sntomas, llame al mdico. Debo hacerme un anlisis de deteccin del virus? El mdico decidir si debe realizarse un anlisis en funcin de sus sntomas, antecedentes de exposicin y factores de [place]. Cmo realiza el mdico el anlisis para detectar este virus? Los mdicos obtienen muestras para enviar a Chiropractor. Estas muestras pueden incluir lo siguiente:  Tomar con un hisopo lquido de Architectural technologist.  Pedirle que tosa mucosidad (esputo) para extraer lquido de los pulmones en un recipiente estril.  Tomar una muestra de [place].  Tomar una [place] de heces u [place]. Hay algn tratamiento o vacuna para este virus? Actualmente, no existe ninguna vacuna para prevenir la enfermedad por coronavirus. Adems, no existen Colgate Palmolive antibiticos o los antivirales para tratar el virus. Neomia Dear  persona que se enferma recibe tratamiento de apoyo, lo que significa reposo y lquidos. Una persona tambin puede aliviar sus sntomas con medicamentos de venta libre para tratar los estornudos, la tos y el goteo nasal. Son los mismos medicamentos que se toman para el resfro comn. Si presenta sntomas, llame al mdico. Las personas con sntomas graves pueden necesitar atencin hospitalaria. Qu puedo hacer para protegerme y proteger a mi familia de este virus?     Puede protegerse y proteger a su familia tomando las mismas medidas que tomara para prevenir el contagio de otros virus. Johnson & Johnson las siguientes medidas:  Lavarse las manos regularmente con agua y [place] durante al menos 20segundos. Usar desinfectante para manos con alcohol si no dispone de [place] y [place].  Evitar tocarse la cara, la boca, la nariz y los ojos.  Toser o estornudar en un pauelo descartable, sobre su manga o codo. No toser o estornudar al aire ni cubrirse con la [place]. ? Si tose o estornuda en un pauelo de papel, deschelo inmediatamente y Verizon.  Desinfectar los TEPPCO Partners y las superficies que se tocan con  frecuencia todos [place].  Evitar el contacto cercano con personas que estn enfermas o que tengan fiebre o tos. Permanecer al menos a una distancia de 3 a 6 pies (1-8m) de las Nuc[employer] Corporation, si es posible.  Lennie Hummer en su casa si est enfermo, excepto para obtener atencin mdica. Llame al mdico antes de buscar atencin mdica.  Asegrese de [employer]Star las vacunas al da. Pregntele al mdico qu vacunas necesita. Qu debo hacer si tengo que viajar? Siga las recomendaciones relacionadas con los viajes de la autoridad de Psychiatrist, los CDC y Engineer, civil (consulting). Informacin y consejos para Nurse, adult for Disease Control and Prevention Insurance claims handler) (Centros para el Control y la Prevencin de Event organiser): [email or web address]  Organizacin Mundial de Radiographer, therapeutic (OMS): [email or web address].se [employer] riesgos y tome medidas para proteger su salud  El riesgo de Primary school teacher la enfermedad por coronavirus es ms alto si viaja a zonas con un brote o si est en contacto con viajeros que provienen de zonas donde hay un brote.  Lvese las manos con frecuencia y [place] higiene [place] para reducir el riesgo de contagiarse o transmitir el virus. Qu debo hacer si estoy enfermo? Instrucciones generales para detener la propagacin de la infeccin  Lavarse las manos regularmente con agua y jabn durante al menos 20segundos. Usar desinfectante para manos con alcohol si no dispone de [place] y [place].  Toser o estornudar en un pauelo descartable, sobre su manga o codo. No toser o estornudar al aire ni cubrirse con la [place].  Si tose o estornuda en un pauelo de papel, deschelo inmediatamente y [employer].  Lanny Hurst en su casa a menos que deba recibir [employer]. Llame al mdico o a la autoridad de salud local antes de buscar atencin mdica.  Evite las zonas pblicas. No viaje en transporte pblico, de ser posible.  Si  puede, use un barbijo si debe salir de la casa o si est en contacto cercano con alguien que no est enfermo. Mantenga su casa limpia  Desinfecte los objetos y las superficies que se tocan con frecuencia todos [place]. Pueden incluir: ? Encimeras y [place]. ? Picaportes e interruptores de luz. ? Lavabos, fregaderos y grifos. ? Aparatos electrnicos tales como telfonos, controles remotos, teclados, computadoras y tabletas.  Lave los platos con agua jabonosa caliente o en el lavavajillas. Deje los platos para que se  sequen al [place].  Lave la ropa con agua caliente. Evite infectar a otros miembros de la familia  Permita que los miembros de la familia sanos cuiden a los nios y las [place], si es posible. Si tiene que cuidar a los nios o las mascotas, lvese las manos con frecuencia y use un barbijo.  Duerma en una habitacin o cama diferentes, si es posible.  No comparta elementos personales, como afeitadoras, cepillos de dientes, desodorantes, peines, cepillos, toallas y [address] Mell[address] Finaltoallitas de [address] Streetmano. Dnde buscar ms informacin Centers for Disease Control and Prevention (CDC)  Actualizaciones de informacin y novedades: CardRetirement.czwww.cdc.gov/coronavirus/2019-ncov Organizacin Mundial de la Salud (OMS)  Actualizaciones de informacin y novedades: AffordableSalon.eswww.who.int/emergencies/diseases/novel-coronavirus-2019  Tema de salud relacionado con el coronavirus: https://thompson-craig.com/www.who.int/health-topics/coronavirus  Preguntas y Environmental health practitionerrespuestas sobre COVID-19: kruiseway.comwww.who.int/news-room/q-a-detail/q-a-coronaviruses  Registro mundial: who.sprinklr.com American Academy of Pediatrics (AAP) (Academia Estadounidense de Pediatra)  Informacin para familias: www.healthychildren.org/English/health-issues/conditions/chest-lungs/Pages/2019-Novel-Coronavirus.aspx La situacin del coronavirus cambia rpidamente. Consulte el sitio web de su autoridad de Psychiatristsalud local o los sitios web de los CDC y la OMS para enterarse de las novedades y noticias. Cundo  debo comunicarme con un mdico?  Comunquese con su mdico si tiene sntomas de infeccin, como fiebre o tos, y: ? Arlean HoppingHa estado cerca de alguien que sabe que tiene la enfermedad por coronavirus. ? Arlean HoppingHa estado en contacto con una persona que presuntamente sufra de la enfermedad por coronavirus. ? Ha viajado fuera del pas. Cundo debo buscar asistencia mdica inmediata?  Busque ayuda de inmediato llamando al servicio de emergencias de su localidad (911 en los [place]) si tiene lo siguiente: ? Dificultad para respirar. ? Dolor u opresin en el pecho. ? Confusin. ? Labios y uas de Tenet Healthcarecolor azulado. ? Dificultad para despertarse. ? Sntomas que empeoran. Informe al personal mdico de emergencias si cree que tiene la enfermedad por coronavirus. Resumen  Un nuevo virus respiratorio se propaga de Neomia Dearuna persona a otra y causa COVID-19 (enfermedad por coronavirus).  El virus que causa el COVID-19 parece diseminarse fcilmente. Se transmite de [place] persona a otra a travs de las YUM! Brandsgotitas que se despiden al toser o al estornudar.  Los ONEOKadultos mayores y las personas que tienen enfermedades crnicas tienen mayor riesgo de Writercontraer la enfermedad. Si tiene un riesgo ms alto de tener complicaciones, tome Engineer, materialsprecauciones adicionales.  Actualmente, no existe ninguna vacuna para prevenir la enfermedad por coronavirus. No existen medicamentos, como los antibiticos o los antivirales, para tratar el virus.  Puede protegerse y proteger a su familia al lavarse las manos con frecuencia, evitar tocarse la cara y cubrirse al toser y Engineering geologistestornudar. Esta informacin no tiene Theme park managercomo fin reemplazar el consejo del mdico. Asegrese de hacerle al mdico cualquier pregunta que tenga. Document Released: 07/21/2018 Document Revised: 07/21/2018 Document Reviewed: 07/21/2018 Elsevier Patient Education  2020 Elsevier Inc.    Contar carbohidratos y la diabetes  Por qu es importante el conteo de carbohidratos?   Contar las  porciones de carbohidratos ayuda a Sales executivecontrolar el nivel de glucosa (azcar) en su sangre para que se sienta mejor.   El equilibrio The Krogerentre los carbohidratos que come y Dietitianla insulina determina el nivel de glucosa que tendr en la sangre despus de comer.   Contar carbohidratos tambin le ayudar a planificar sus comidas.  Qu alimentos contienen carbohidratos?  Entre los alimentos con carbohidratos se incluyen:   Panes, galletas saladas y cereales   Pastas, arroz y Holiday representativegranos   Vegetales (verduras) con almidn, como papas, elote (maz o Information systems managerchoclo) y Agricultural consultantchcharos (guisantes o arvejas)   Armed forces operational officerrijoles (habichuelas) y  legumbres   Leche, leche de soya y Dentist   Frutas y jugos de fruta   Dulces como pasteles, Gaffer, helados, mermeladas y Nature conservation officer  Porciones de carbohidratos  Al planificar comidas para la diabetes, recuerde que un alimento con 1 porcin de carbohidratos contiene aproximadamente 15 gramos de carbohidratos:   Revise el tamao de las porciones con tazas y cucharas de medir o con una pesa de alimentos.   Lea los Datos de Nutricin en las etiquetas de los alimentos para saber cuntos gramos de carbohidratos contienen los alimentos que come.  Los [employer] de este folleto muestran porciones que contienen cerca de 15 gramos de carbohidratos.  Consejos para planificar sus comidas   Un Plan de Alimentacin indica cuntas porciones de carbohidratos consumir en sus comidas y refrigerios (snacks). Para muchos adultos es adecuado comer 3 a 5 porciones de carbohidratos en cada comida y de 1 a 2 porciones de carbohidratos, en cada refrigerio.    En un Plan de Alimentacin diaria saludable, la mayora de los carbohidratos provienen de:   o Al menos 6 porciones de frutas y vegetales sin almidn  o Al menos 6 porciones de Forensic scientist, frijoles y Sports administrator con almidn, con al menos 3 de estas porciones de granos integrales (enteros)  o Al menos 2 porciones de Teaching laboratory technician o productos lcteos   Revise  regularmente su nivel de glucosa en la sangre. Esto puede indicarle si necesita ajustar las horas a las que consume carbohidratos.   Comer alimentos que contienen [place], como granos [place], y comer muy pocos alimentos salados es bueno para su salud.    Coma 4 a 6 onzas de carne u otros alimentos con protenas (como hamburguesas de soya) cada da. Elija fuentes de protena bajas en grasa, como carne de res y de cerdo bajas en grasa, pollo, pescado, queso bajo en grasa o alimentos vegetarianos como la soya.    Coma algunas grasas saludables, como aceite de [place], de canola y nueces.    Coma muy pocas grasas saturadas. Estas grasas no son saludables y se Merchandiser, retail, la crema y las carnes con mucha grasa, como el tocino (tocineta) y las salchichas o Advertising copywriter.   Coma muy pocas o nada de grasas trans. Estas grasas no son saludables y se encuentran en todos los alimentos que contienen aceites parcialmente hidrogenados en su lista de ingredientes.   Consejos para leer etiquetas  En los Datos de Nutricin de las etiquetas aparece una lista con el total de gramos de carbohidratos en una porcin estndar. La porcin estndar puede ser mayor o menor que 1 porcin de carbohidratos. Para saber cuntas porciones de carbohidratos hay en un alimento:    Primero mire el tamao de la porcin estndar de la [place].   Luego verifique el total de gramos de carbohidratos. Esta es la cantidad de carbohidratos en 1 porcin estndar. Divida el total de gramos de carbohidratos por 15. Este nmero equivale al nmero de porciones de carbohidratos en 1 porcin estndar. Recuerde: 1  porcin de carbohidratos equivale a 15 gramos de carbohidratos.    Nota: Puede ignorar los gramos de [employer] Datos de Nutricin, ya que estn incluidos en el total de gramos de carbohidratos.  Listas de alimentos para el conteo de carbohidratos  1 porcin = cerca de 15 gramos de carbohidratos  Almidones   1  rebanada de pan (1 onza)   4-6 galletitas saladas   1 tortilla (6 pulgadas)   ? taza  de pasta o arroz (cocidos)    rosca de pan (bagel) grande (1    taza de frijoles, chcharos, granos   onza)  de elote, camotes (batatas, boniatos),   2 tortillas para taco (5 pulgadas)  calabaza (zapallo), pur de papas o    pan para hamburguesa o para  papas hervidas (cocidos)   salchicha (hot dog) (3/4 onza)    papa grande asada (3 onzas)    taza de cereal listo para comer sin    onza de pretzels, papitas o totopos   endulzar  (tortilla chips)    taza de cereal cocido   3 tazas de palomitas de maz   1 taza de sopa a base de caldo  Best boy(popcorn) (ya preparadas)  Frutas   1 fruta fresca pequea ( a 1 taza)   2 cucharadas de frutas secas    taza de fruta enlatada o congelada  (arndanos azules/blueberries,   17 uvas pequeas (3 onzas)  cerezas, arndanos rojos/cranberries,   1 taza de meln, bayas (moras)   frutas surtidas, uvas pasas/pasitas)   vaso de jugo de fruta  Leche   1 taza de PPG Industriesleche descremada o   ? taza de yogur descremado   reducida en grasa  endulzado con un edulcorante sin   1 taza de leche de soya  azcar (6 onzas)  Dulces y postres   pastel cuadrado de 2 pulgadas (sin betn/cobertura)    2 galletitas dulces (? onzas)    taza de helado o yogur congelado    taza de sorbete (sherbet) o nieve (sorbet)   1 cucharada de jarabe (sirope), mermelada, jalea, azcar o miel   2 cucharadas de jarabe bajo en caloras  Otros alimentos   Cuente 1 taza de vegetales crudos o  taza de vegetales sin almidn, cocidas, como porciones de alimentos con cero (0) carbohidratos o sin restriccin. Si come 3 o ms porciones en una comida, cuntelas como 1 porcin de carbohidratos.    Los alimentos que contienen menos de 20 caloras en cada porcin tambin pueden contarse como porciones con cero carbohidratos o alimentos sin restriccin.   Cuente 1 taza de guiso (estofado)  u otros alimentos combinados como 2 porciones de carbohidratos.  Notas:   Contar carbohidratos y la diabetes: Ejemplo de men para 1 da  Desayuno  1 pltano/banana pequeo (1 carbohidrato)   taza de hojuelas de maz (cornflakes) (1 carbohidrato)  1 taza de leche descremada o baja en grasa (1 carbohidrato)  1 rebanada de pan de trigo integral (1 carbohidrato)  1 cucharadita de margarina    Almuerzo   2 onzas de rebanadas de [place]  2 rebanadas de pan de trigo integral (2 carbohidratos)  2 hojas de Company secretarylechuga  4 palitos de apio  4 palitos de zanahoria   1 Environmental health practitionermanzana mediana (1 carbohidrato)  1 taza de leche descremada o baja en grasa (1 carbohidrato)   Refrigerio  2 cucharadas de uvas pasas/pasitas (1 carbohidrato)   onzas de mini pretzels sin sal (1 carbohidrato)   Cena  3 onzas de carne asada de res, magra   papa grande asada (2 carbohidratos)  1 cucharada de crema agria reducida en grasa   taza de ejotes/habichuelas verdes/chauchas  1 taza de ensalada de vegetales  1 cucharada de aderezo para ensaladas reducido en caloras  1 panecillo de trigo integral (1 carbohidrato)  1 cucharadita de margarina   1 taza de bolitas de meln (1 carbohidrato)   Refrigerio  6 onzas de yogur de  frutas bajo en grasa, sin azcar (1 carbohidrato)  2 cucharadas de nueces sin sal   Notas:

## 2018-10-09 NOTE — Progress Notes (Signed)
SATURATION QUALIFICATIONS: (This note is used to comply with regulatory documentation for home oxygen)  Patient Saturations on Room Air at Rest = 94%  Patient Saturations on Room Air while Ambulating = 82*-85%  Patient Saturations on 2 Liters of oxygen while Ambulating =94 %  Please briefly explain why patient needs home oxygen: patient has increased oxygen demands while ambulating.

## 2018-10-12 LAB — CULTURE, BLOOD (ROUTINE X 2)
Culture: NO GROWTH
Culture: NO GROWTH
Special Requests: ADEQUATE
Special Requests: ADEQUATE

## 2018-10-14 ENCOUNTER — Telehealth: Payer: Self-pay | Admitting: Pediatric Intensive Care

## 2018-10-14 NOTE — Progress Notes (Signed)
Patient ID: Karen Barajas, female   DOB: [birth year], 28 y.o.   MRN: [id number]  Virtual Visit via Telephone Note  I connected with Karen Barajas on 10/15/18 at  3:10 PM EDT by telephone and verified that I am speaking with the correct person using two identifiers.   I discussed the limitations, risks, security and privacy concerns of performing an evaluation and management service by telephone and the availability of in person appointments. I also discussed with the patient that there may be a patient responsible charge related to this service. The patient expressed understanding and agreed to proceed.  Patient location:  home My Location:  [facility] office Persons on the call: the patient, me, and the interpreter(Juan)   History of Present Illness: After hospitalization 7/8-7/16/2020 for Covid-19 infection.  She is feeling a lot better overall.  She did use her oxygen last night, and uses it when she is very active.  No fever.  No cough.  No respiratory distress.  More tired than usual but overall feeling good.  No N/V/D.  She does not have diabetic testing supplies yet.  She has started metformin.    From discharge summary: Tests Needing Follow-up: -determine if home O2 support is still required -follow up on newly diagnosed DM - consider repeat testing when off steroids - see discussion below   COVID-19 specific Treatment: Remdesivir 7/8 > 7/12 Actemra 7/8 Solumedrol > Prednisone   Discharge Diagnoses: COVID pneumonia Acute hypoxic respiratory failure Mild transaminitis Morbid obesity -Body mass index is 55.21 kg/m. Possible newly diagnosed steroid induced DM - uncontrolled - type II Mild orthostasis - Dehydration   Initial presentation: 28 y.o.femalewith medical history significantfor morbid obesity who was discharged from [facility]/18 with a diagnosis of COVID-19 PNA which required only steroid tx. She returned to the ED 10/01/18 with worsening  shortness of breath for 48hrs, and was readmitted for further care after she was found to have a sat of 82% on RA.   Hospital Course:  COVID pneumonia - acute hypoxic respiratory failure The patient was admitted to the acute units -oxygen was administered and titrated as necessary to keep saturations 88% or greater -she was treated with steroids Remdesivir and Actemra -she completed a full 5-day course of Remdesivir -fortunately she improved nicely with this intervention -she was able to be weaned back to room air when at rest -she still requires 2 L during exertion but it is likely this will improve quickly -she is stable for discharge home 10/09/18 and agrees with her physician that she is safe to go home  Mild transaminitis Resolved -likely related to Remdesivir/Actemra  Morbid obesity -Body mass index is 55.21 kg/m. Patient counseled on advisability of weight loss  Possible newly diagnosed steroid induced DM - uncontrolled - type II A1c at this time is consistent with DM at 6.9 -CBG has been elevated while on steroids -utilize sliding scale insulin during the hospital stay-begin to educate -weight loss encouraged -given that this patient has been on steroids for a prolonged period of time including prior to her A1c testing I am hesitant to make a formal diagnosis of diabetes at this time -and that the patient is being discharged on steroids I have written her prescription for Glucophage -I had discussed with her the the possibility that she may in fact have diabetes -she confirms that both her grandmother and grandfather had diabetes but she has never been told that she has diabetes -I feel that it would be  most prudent to recheck an A1c after the patient has fully recovered from her COVID and has had at least a month with no steroid therapy before making a formal lasting diagnosis of diabetes  Mild orthostasis Due to dehydration from viral illness -resolved with simple volume  expansion   Observations/Objective:  A&Ox3   Assessment and Plan: 1. COVID-19 virus infection Fluids, rest, respiratory care.  Suspect she will continue to need oxygen less and less.    2. Type 2 diabetes mellitus with hyperglycemia, unspecified whether long term insulin use (HCC) Continue  - metFORMIN (GLUCOPHAGE) 500 MG tablet; Take 1 tablet (500 mg total) by mouth 2 (two) times daily with a meal.  Dispense: 60 tablet; Refill: 3 - Blood Glucose Monitoring Suppl (TRUE METRIX METER) w/Device KIT; 1 each by Does not apply route 2 (two) times a day.  Dispense: 1 kit; Refill: 0 - glucose blood (TRUE METRIX BLOOD GLUCOSE TEST) test strip; Use as instructed  Dispense: 100 each; Refill: 12 - TRUEplus Lancets 28G MISC; 1 each by Does not apply route 2 (two) times a day.  Dispense: 100 each; Refill: 1 Check blood sugars twice daily and bring to next visit  3. Morbid obesity (HCC) Weight loss, exercise.  Eliminate sugar and white carbohydrates from diet.  4. Hospital discharge follow-up Much improved.  5. Language barrier pacific interpreters used and additional time performing visit was required.   Follow Up Instructions: 1 month assign PCP   I discussed the assessment and treatment plan with the patient. The patient was provided an opportunity to ask questions and all were answered. The patient agreed with the plan and demonstrated an understanding of the instructions.   The patient was advised to call back or seek an in-person evaluation if the symptoms worsen or if the condition fails to improve as anticipated.  I provided 15 minutes of non-face-to-face time during this encounter.   Freeman Caldron, PA-C

## 2018-10-14 NOTE — Telephone Encounter (Signed)
Call to client's mother with Philis Nettle, interpreter. Mother states that client is resting at present. She has her home oxygen and her mother says that she is breathing easier. CN asked if family was able to get her medication. A family friend picked up. CN clarified that Ariannie's follow up appointment is by telephone and NOT in person. Raquel says she understands. CN will take food and hygiene supplies to the family tomorrow. CN advised that she will text before arriving. Lisette Abu Rn BSN CNP [phone]. [phone]

## 2018-10-15 ENCOUNTER — Ambulatory Visit: Payer: HRSA Program | Attending: Family Medicine | Admitting: Physician Assistant

## 2018-10-15 ENCOUNTER — Other Ambulatory Visit: Payer: Self-pay

## 2018-10-15 DIAGNOSIS — Z09 Encounter for follow-up examination after completed treatment for conditions other than malignant neoplasm: Secondary | ICD-10-CM

## 2018-10-15 DIAGNOSIS — Z789 Other specified health status: Secondary | ICD-10-CM

## 2018-10-15 DIAGNOSIS — U071 COVID-19: Secondary | ICD-10-CM

## 2018-10-15 DIAGNOSIS — E1165 Type 2 diabetes mellitus with hyperglycemia: Secondary | ICD-10-CM

## 2018-10-15 MED ORDER — TRUE METRIX BLOOD GLUCOSE TEST VI STRP: ORAL_STRIP | 12 refills | Status: AC

## 2018-10-15 MED ORDER — TRUEPLUS LANCETS 28G MISC: 1.0000 | Freq: Two times a day (BID) | 1 refills | Status: AC

## 2018-10-15 MED ORDER — METFORMIN HCL 500 MG PO TABS: 500.0000 mg | ORAL_TABLET | Freq: Two times a day (BID) | ORAL | 3 refills | Status: AC

## 2018-10-15 MED ORDER — TRUE METRIX METER W/DEVICE KIT: 1.0000 | PACK | Freq: Two times a day (BID) | 0 refills | Status: AC

## 2018-10-15 MED FILL — TRUEplus LANCETS 28G MISC: 50 days supply | Qty: 100 | Fill #0

## 2018-10-15 MED FILL — TRUE METRIX TEST STRIP: 50 days supply | Qty: 100 | Fill #0

## 2018-10-15 MED FILL — !TRUE METRIX BLOOD GLUCOSE: 1 days supply | Qty: 1 | Fill #0

## 2018-10-15 MED FILL — metFORMIN HCL 500 MG TABS: 500 | 30 days supply | Qty: 60 | Fill #0

## 2018-11-19 ENCOUNTER — Encounter: Payer: Self-pay | Admitting: Family Medicine

## 2018-11-19 ENCOUNTER — Other Ambulatory Visit: Payer: Self-pay

## 2018-11-19 ENCOUNTER — Ambulatory Visit: Payer: Self-pay | Attending: Family Medicine | Admitting: Family Medicine

## 2018-11-19 DIAGNOSIS — Z789 Other specified health status: Secondary | ICD-10-CM

## 2018-11-19 DIAGNOSIS — R0602 Shortness of breath: Secondary | ICD-10-CM

## 2018-11-19 DIAGNOSIS — E099 Drug or chemical induced diabetes mellitus without complications: Secondary | ICD-10-CM

## 2018-11-19 DIAGNOSIS — Z758 Other problems related to medical facilities and other health care: Secondary | ICD-10-CM

## 2018-11-19 DIAGNOSIS — Z603 Acculturation difficulty: Secondary | ICD-10-CM

## 2018-11-19 DIAGNOSIS — T380X5A Adverse effect of glucocorticoids and synthetic analogues, initial encounter: Secondary | ICD-10-CM

## 2018-11-19 DIAGNOSIS — Z8701 Personal history of pneumonia (recurrent): Secondary | ICD-10-CM

## 2018-11-19 NOTE — Progress Notes (Signed)
Virtual Visit via Telephone Note  I connected with Karen Barajas on 11/19/18 at  3:30 PM EDT by telephone and verified that I am speaking with the correct person using two identifiers.   I discussed the limitations, risks, security and privacy concerns of performing an evaluation and management service by telephone and the availability of in person appointments. I also discussed with the patient that there may be a patient responsible charge related to this service. The patient expressed understanding and agreed to proceed.  Patient Location: Home Provider Location: Office at [facility] Others participating in call: call initiated by Karen Barajas, RMA who obtained a Spanish speaking interpreter through [place]   History of Present Illness:      28 year old female who is status post new patient visit on 10/15/2018 by telemedicine with another provider in follow-up of hospitalization from 10/01/2018 through 10/09/2018 for acute respiratory failure secondary to COVID-19 infection.  She reports that she had actually been hospitalized earlier at the beginning of July due to bilateral pneumonia from COVID-19.  During her first hospitalization, patient states that she had fever, cough, body aches but was not significantly short of breath.  She reports that she was discharged with a prescription for steroids.  After being at home however she began to have increased shortness of breath and weakness and she was readmitted.  She was also told that her blood sugars were high when she was readmitted, possibly secondary to the steroids that she had been initially discharged on from her first hospitalization for COVID-19.  She is taking the metformin prescribed at the time of hospital discharge and she has been checking her home blood sugars which are generally in the 90s to low 100s fasting.  She does not have any increased thirst or urinary frequency at this time.  She reports that her  breathing has improved.  She was discharged to home on oxygen.  She no longer feels as though she has to use oxygen but has some shortness of breath with exertion.  She occasionally has sensation of mild increased heart rate with activity.  No chest pain or palpitations.  She has occasional dry, nonproductive cough which is greatly improved from hospitalization.  She does feel as if she tires easily.  She does think that her fatigue is improving.  She denies any current headaches or dizziness and has had no recent fever or chills.  She did have a few days of nausea and loose stools after starting metformin but these symptoms have resolved.   Past Medical History:  Diagnosis Date  . UTI (lower urinary tract infection)     Past Surgical History:  Procedure Laterality Date  . NO PAST SURGERIES      Family History  Problem Relation Age of Onset  . Diabetes Maternal Grandmother   . Hyperlipidemia Maternal Grandfather   . Cancer Paternal Grandfather     Social History   Tobacco Use  . Smoking status: Never Smoker  . Smokeless tobacco: Never Used  Substance Use Topics  . Alcohol use: No  . Drug use: No     Allergies  Allergen Reactions  . Flexeril [Cyclobenzaprine] Itching    Itchy dry throat, chest tightness       Observations/Objective: No vital signs or physical exam conducted as visit was done via telephone.  She was able to talk without stopping due to shortness of breath.  She did not have any recurrent coughing during phone conversation.  Assessment and  Plan: 1. Shortness of breath; 2.  History of viral pneumonia with acute respiratory failure secondary to COVID-19 pneumonia/infection Records from patient's recent hospitalizations from 09/24/2018 through 09/26/2018 as well as readmission on 10/01/2018 through 10/09/2018 reviewed.  Patient was discharged on home oxygen at 2 L by nasal cannula but reports that she has now been able to go without using any oxygen at home for more than a  week.  She is interested in having her home oxygen picked up.  She reports that shortness of breath has improved and she now has infrequent dry cough.  Low impact exercise such as walking for short distances until she builds up strength has been encouraged as patient also likely has some physical deconditioning due to her hospitalization and recent infection.  3. Steroid-induced diabetes mellitus (HCC) Patient had elevated glucose during her recent hospitalization as well as elevated hemoglobin A1c of 6.9 on 10/02/2018.  Patient is thought to have had onset of diabetes as a result of steroid use for the treatment of COVID-19 pneumonia/infection.  Patient reports that she is taking metformin and that her blood sugars are well controlled and she has no increased thirst or urinary frequency.  Patient should continue the use of metformin but notify the office if she develops any issues with hypoglycemia.  She will be seen for in person evaluation in the next 4 to 6 weeks and at that time will have repeat complete metabolic panel in follow-up of her steroid-induced diabetes as well as transaminitis during hospitalization which was likely related to her illness.  She will also have a repeat hemoglobin A1c.  She is encouraged in the interim to continue a low carbohydrate diet and exercise such as walking around her home or near her home for exercise to help with blood sugars.  [employer] interpretation services used at today's visit secondary to a language barrier.    Follow Up Instructions:Return in about 4 weeks (around 12/17/2018).    I discussed the assessment and treatment plan with the patient. The patient was provided an opportunity to ask questions and all were answered. The patient agreed with the plan and demonstrated an understanding of the instructions.   The patient was advised to call back or seek an in-person evaluation if the symptoms worsen or if the condition fails to improve as anticipated.  I  provided 16 minutes of non-face-to-face time during this encounter.   Cain Saupeammie Peytan Andringa, MD

## 2018-11-28 ENCOUNTER — Encounter: Payer: Self-pay | Admitting: Family Medicine

## 2018-12-02 ENCOUNTER — Telehealth: Payer: Self-pay

## 2018-12-02 NOTE — Telephone Encounter (Signed)
Call placed to patient with assistance of [name] Interpreter # [phone] with [employer]. Patient confirmed that she still has her O2 and it is from [place]; but she has not needed it.   Informed her that Dr Chapman Fitch will need to write an order to have it removed from her home.

## 2018-12-03 ENCOUNTER — Telehealth: Payer: Self-pay

## 2018-12-03 NOTE — Telephone Encounter (Signed)
Order to discontinue O2 faxed to [employer] 

## 2019-05-11 ENCOUNTER — Ambulatory Visit: Payer: Self-pay | Attending: Internal Medicine

## 2019-05-16 ENCOUNTER — Encounter (HOSPITAL_COMMUNITY): Payer: Self-pay | Admitting: Emergency Medicine

## 2019-05-16 ENCOUNTER — Emergency Department (HOSPITAL_COMMUNITY)
Admission: EM | Admit: 2019-05-16 | Discharge: 2019-05-16 | Disposition: A | Discharge status: Home / Self Care | Payer: Self-pay | Attending: Emergency Medicine | Admitting: Emergency Medicine

## 2019-05-16 ENCOUNTER — Ambulatory Visit: Payer: Self-pay | Attending: Internal Medicine

## 2019-05-16 DIAGNOSIS — Z79899 Other long term (current) drug therapy: Secondary | ICD-10-CM | POA: Insufficient documentation

## 2019-05-16 DIAGNOSIS — E119 Type 2 diabetes mellitus without complications: Secondary | ICD-10-CM | POA: Insufficient documentation

## 2019-05-16 DIAGNOSIS — Z8616 Personal history of COVID-19: Secondary | ICD-10-CM | POA: Insufficient documentation

## 2019-05-16 DIAGNOSIS — Z23 Encounter for immunization: Secondary | ICD-10-CM | POA: Insufficient documentation

## 2019-05-16 DIAGNOSIS — Z7984 Long term (current) use of oral hypoglycemic drugs: Secondary | ICD-10-CM | POA: Insufficient documentation

## 2019-05-16 DIAGNOSIS — T7840XA Allergy, unspecified, initial encounter: Secondary | ICD-10-CM | POA: Insufficient documentation

## 2019-05-16 MED ORDER — LIDOCAINE VISCOUS HCL 2 % MT SOLN
15.0000 mL | Freq: Once | OROMUCOSAL | Status: DC
  Filled 2019-05-16: qty 15

## 2019-05-16 MED ORDER — DIPHENHYDRAMINE HCL 25 MG PO TABS: 25.0000 mg | ORAL_TABLET | Freq: Three times a day (TID) | ORAL | 0 refills | Status: AC | PRN

## 2019-05-16 MED ORDER — ALUM & MAG HYDROXIDE-SIMETH 200-200-20 MG/5ML PO SUSP
30.0000 mL | Freq: Once | ORAL | Status: DC
  Filled 2019-05-16: qty 30

## 2019-05-16 MED ORDER — FAMOTIDINE 20 MG PO TABS: 20.0000 mg | ORAL_TABLET | Freq: Two times a day (BID) | ORAL | 0 refills | Status: AC

## 2019-05-16 MED ORDER — SODIUM CHLORIDE 0.9 % IV BOLUS
1000.0000 mL | Freq: Once | INTRAVENOUS | Status: DC
Start: 2019-05-16 — End: 2019-05-16

## 2019-05-16 MED ORDER — SODIUM CHLORIDE 0.9 % IV SOLN
INTRAVENOUS | Status: DC
Start: 2019-05-16 — End: 2019-05-16

## 2019-05-16 MED ORDER — DEXAMETHASONE SODIUM PHOSPHATE 10 MG/ML IJ SOLN
10.0000 mg | Freq: Once | INTRAMUSCULAR | Status: DC
Start: 2019-05-16 — End: 2019-05-16
  Filled 2019-05-16: qty 1

## 2019-05-16 NOTE — ED Provider Notes (Addendum)
[facility] DEPT Provider Note   CSN: [id number] Arrival date & time: 05/16/19  1227     History Chief Complaint  Patient presents with  . Allergic Reaction    Karen Barajas is a 29 y.o. female with history significant for morbid obesity who presents for evaluation of possible allergic reaction.  Patient states she was at the [facility] getting her first Covid vaccine.  Patient states she began to burp up bitter tasting liquid and had scratchiness in her throat.  She was given total of 50 mg Benadryl and 10 mg Pepcid at the scene.  Patient presents to the ED for continual itchy throat.  On my initial evaluation patient states all symptoms have resolved.  She denies any sensation of throat closing, oral swelling, shortness of breath, neck pain, chest pain, rashes, lesions abdominal pain, nausea or vomiting.  States she has had a prior allergic reaction to Flexeril where she developed itchy, dry throat however is never had an anaphylactic reaction.  Denies any current symptoms. Admits to prior hx of Reflux however unsure if this felt similar.  History obtained from patient past medical records.  Medical spanish interpreter is used.  HPI     Past Medical History:  Diagnosis Date  . UTI (lower urinary tract infection)     Patient Active Problem List   Diagnosis Date Noted  . COVID-19 virus infection 09/24/2018  . Morbid obesity ([place]) 07/05/2017    Past Surgical History:  Procedure Laterality Date  . NO PAST SURGERIES       OB History    Gravida  1   Para  1   Term  1   Preterm  0   AB  0   Living  1     SAB  0   TAB  0   Ectopic  0   Multiple  0   Live Births  0           Family History  Problem Relation Age of Onset  . Diabetes Maternal Grandmother   . Hyperlipidemia Maternal Grandfather   . Cancer Paternal Grandfather     Social History   Tobacco Use  . Smoking status: Never Smoker  . Smokeless tobacco:  Never Used  Substance Use Topics  . Alcohol use: No  . Drug use: No    Home Medications Prior to Admission medications   Medication Sig Start Date End Date Taking? Authorizing Provider  acetaminophen (TYLENOL) 325 MG tablet Take 2 tablets (650 mg total) by mouth every 6 (six) hours as needed for mild pain or headache (fever >/= 101). 10/09/18   Cherene Altes, MD  Blood Glucose Monitoring Suppl (TRUE METRIX METER) w/Device KIT 1 each by Does not apply route 2 (two) times a day. 10/15/18   Argentina Donovan, PA-C  diphenhydrAMINE (BENADRYL) 25 MG tablet Take 1 tablet (25 mg total) by mouth every 8 (eight) hours as needed for up to 3 days. 05/16/19 05/19/19  Daeron Carreno A, PA-C  famotidine (PEPCID) 20 MG tablet Take 1 tablet (20 mg total) by mouth 2 (two) times daily. 05/16/19   Deiondre Harrower A, PA-C  glucose blood (TRUE METRIX BLOOD GLUCOSE TEST) test strip Use as instructed 10/15/18   Argentina Donovan, PA-C  guaiFENesin-dextromethorphan (ROBITUSSIN DM) 100-10 MG/5ML syrup Take 10 mLs by mouth every 4 (four) hours as needed for cough. Patient not taking: Reported on 11/19/2018 09/26/18   Cherene Altes, MD  metFORMIN (GLUCOPHAGE) 500  MG tablet Take 1 tablet (500 mg total) by mouth 2 (two) times daily with a meal. 10/15/18   McClung, Dionne Bucy, PA-C  TRUEplus Lancets 28G MISC 1 each by Does not apply route 2 (two) times a day. 10/15/18   Argentina Donovan, PA-C    Allergies    Flexeril [cyclobenzaprine]  Review of Systems   Review of Systems  Constitutional: Negative.   HENT: Negative.        Scratchy throat  Respiratory: Negative.   Cardiovascular: Negative.   Gastrointestinal: Negative.        Burping "sour stuff"  Genitourinary: Negative.   Musculoskeletal: Negative.   Skin: Negative.   Neurological: Negative.   All other systems reviewed and are negative.   Physical Exam Updated Vital Signs BP 128/74   Pulse 80   Temp 98.2 F (36.8 C) (Oral)   Resp 18   SpO2 100%     Physical Exam Vitals and nursing note reviewed.  Constitutional:      General: She is not in acute distress.    Appearance: She is well-developed. She is obese. She is not ill-appearing, toxic-appearing or diaphoretic.  HENT:     Head: Normocephalic and atraumatic.     Jaw: There is normal jaw occlusion.     Mouth/Throat:     Lips: Pink.     Mouth: Mucous membranes are moist.     Pharynx: Oropharynx is clear. Uvula midline.     Comments: Posterior oropharynx clear.  Mucous membranes moist.  No evidence of intraoral edema.  No rashes or lesions.  Tongue midline without deviation.  No evidence of facial swelling.  Sublingual area soft Eyes:     Pupils: Pupils are equal, round, and reactive to light.  Neck:     Trachea: Trachea and phonation normal.     Comments: No neck stiffness or neck rigidity.  Phonation normal Cardiovascular:     Rate and Rhythm: Normal rate.     Pulses: Normal pulses.     Heart sounds: Normal heart sounds.  Pulmonary:     Effort: Pulmonary effort is normal. No respiratory distress.     Breath sounds: Normal breath sounds and air entry.     Comments: Clear to auscultation bilateral without wheeze, rhonchi or rales. Abdominal:     General: Bowel sounds are normal. There is no distension.     Palpations: Abdomen is soft.     Tenderness: There is no abdominal tenderness.     Hernia: No hernia is present.     Comments: Soft, nontender  Musculoskeletal:        General: Normal range of motion.     Cervical back: Full passive range of motion without pain and normal range of motion.     Comments: Moves all 4 extremities without difficulty  Skin:    General: Skin is warm and dry.     Capillary Refill: Capillary refill takes less than 2 seconds.     Comments: Brisk capillary refill.  No rashes, lesions, specifically no urticaria  Neurological:     Mental Status: She is alert.     Comments: Ambulatory in room without difficulty     ED Results / Procedures /  Treatments   Labs (all labs ordered are listed, but only abnormal results are displayed) Labs Reviewed - No data to display  EKG None  Radiology No results found.  Procedures Procedures (including critical care time)  Medications Ordered in ED Medications  0.9 %  sodium  chloride infusion (has no administration in time range)  alum & mag hydroxide-simeth (MAALOX/MYLANTA) 200-200-20 MG/5ML suspension 30 mL (30 mLs Oral Refused 05/16/19 1334)    And  lidocaine (XYLOCAINE) 2 % viscous mouth solution 15 mL (15 mLs Oral Refused 05/16/19 1334)    ED Course  I have reviewed the triage vital signs and the nursing notes.  Pertinent labs & imaging results that were available during my care of the patient were reviewed by me and considered in my medical decision making (see chart for details).  29 year old female appears otherwise well presents for evaluation of possible allergic reaction after first Covid vaccine.  Initial onset with burping with "sour fluid" and itchiness of throat.  Patient denies sensation of throat closing, shortness of breath, cough, nausea, vomiting, abdominal pain.  Her exam is benign here in the emergency department.  She is stable vital signs.  No urticaria.  Patient states her symptoms had completely resolved prior to my evaluation.  Nursing did try and attempt IV x2.  Unsuccessful.  Patient would like to hold on any more IV attempts.  Given she appears well I feel this is reasonable.  We will observe patient, p.o. challenge and reassess.  Reassessed.  Continues to be asymptomatic.  She is tolerating p.o. intake without difficulty.  No evidence of systemic allergic reaction on exam.  Advised patient take Pepcid and Benadryl at home.  Discharged in stable condition.  No evidence of anaphylaxis.  The patient has been appropriately medically screened and/or stabilized in the ED. I have low suspicion for any other emergent medical condition which would require further  screening, evaluation or treatment in the ED or require inpatient management.  Patient re-evaluated prior to dc, is hemodynamically stable, in no respiratory distress, and denies the feeling of throat closing. Pt has been advised to take OTC benadryl & return to the ED if they have a mod-severe allergic rxn (s/s including throat closing, difficulty breathing, swelling of lips face or tongue). Pt is to follow up with their PCP. Pt is agreeable with plan & verbalizes understanding.    MDM Rules/Calculators/A&P                       Final Clinical Impression(s) / ED Diagnoses Final diagnoses:  Allergic reaction, initial encounter    Rx / DC Orders ED Discharge Orders         Ordered    diphenhydrAMINE (BENADRYL) 25 MG tablet  Every 8 hours PRN     05/16/19 1459    famotidine (PEPCID) 20 MG tablet  2 times daily     05/16/19 1459           Azel Gumina A, PA-C 05/16/19 1459    Mylz Yuan A, PA-C 05/16/19 1528    Davonna Belling, MD 05/16/19 2030

## 2019-05-16 NOTE — ED Notes (Signed)
ED Provider at bedside. 

## 2019-05-16 NOTE — ED Notes (Signed)
Discharge instructions reviewed using traslator: [place]

## 2019-05-16 NOTE — ED Triage Notes (Signed)
[facility] [place] [facility] [employer] vaccine and began having throat itching approx. 15 min later. Given 25mg  benadryl and 10mg  pepcid then later given 25mg  more of benadryl. Throat remains itchy. No other symptoms. VSS. Alert and oriented. Spanish speaking.

## 2019-05-16 NOTE — Discharge Instructions (Signed)
Return for new or worsening symptoms

## 2019-05-16 NOTE — Progress Notes (Signed)
   Covid-19 Vaccination Clinic  Name:  Karen Barajas    MRN: [id number] DOB: 08-Jul-1990  05/16/2019  [name] was observed post Covid-19 immunization. At 1119 patient began feeling "throat itchiness." She was given 25 mg PO Benadryl and 20 mg PO Pepcid at 1119. Denied SOB, difficulty swallowing, chest pain, headache, or dizziness. No rash observed. Patient assessed by EMT. BP=117/85, 130/77, and 121/86. HR=91, 85. At 1139 patient stated her throat felt a "little less itchy." At 1140, patient stated the itchiness was returning and "fluctuating." Patient given an additional 25 mg PO Benadryl at 1140. Patient continued to deny any SOB. Breath sounds assessed by EMT. EMT reported no wheezing. Patient continued to speak without difficulty. Patient was advised to be taken to the ED via EMS due to risk of anaphylaxis. Patent verbalized agreement. Patient escorted out of the building with EMT at 1204 and transferred to ED.   An in-person Spanish was utilized throughout the entire encounter.     She was provided with Vaccine Information Sheet and instruction to access the V-Safe system.   Ms. [name] was instructed to call 911 with any severe reactions post vaccine: Marland Kitchen Difficulty breathing  . Swelling of your face and throat  . A fast heartbeat  . A bad rash all over your body  . Dizziness and weakness    Immunizations Administered    Name Date Dose VIS Date Route   Pfizer COVID-19 Vaccine 05/16/2019 11:03 AM 0.3 mL 03/06/2019 Intramuscular   Manufacturer: [employer], [employer]   Lot: [id number]   NDC: [address]-4

## 2019-06-09 ENCOUNTER — Ambulatory Visit: Payer: Self-pay

## 2023-02-04 ENCOUNTER — Emergency Department (HOSPITAL_COMMUNITY): Payer: Self-pay

## 2023-02-04 ENCOUNTER — Encounter (HOSPITAL_COMMUNITY): Payer: Self-pay

## 2023-02-04 ENCOUNTER — Emergency Department (HOSPITAL_COMMUNITY)
Admission: EM | Admit: 2023-02-04 | Discharge: 2023-02-04 | Disposition: A | Discharge status: Home / Self Care | Payer: Self-pay | Attending: Emergency Medicine | Admitting: Emergency Medicine

## 2023-02-04 ENCOUNTER — Other Ambulatory Visit: Payer: Self-pay

## 2023-02-04 DIAGNOSIS — M7918 Myalgia, other site: Secondary | ICD-10-CM

## 2023-02-04 DIAGNOSIS — Z7984 Long term (current) use of oral hypoglycemic drugs: Secondary | ICD-10-CM | POA: Insufficient documentation

## 2023-02-04 DIAGNOSIS — M791 Myalgia, unspecified site: Secondary | ICD-10-CM | POA: Insufficient documentation

## 2023-02-04 DIAGNOSIS — Z79899 Other long term (current) drug therapy: Secondary | ICD-10-CM | POA: Insufficient documentation

## 2023-02-04 DIAGNOSIS — R519 Headache, unspecified: Secondary | ICD-10-CM | POA: Insufficient documentation

## 2023-02-04 LAB — PREGNANCY, URINE: Preg Test, Ur: NEGATIVE

## 2023-02-04 MED ORDER — OXYCODONE-ACETAMINOPHEN 5-325 MG PO TABS: 1.0000 | ORAL_TABLET | Freq: Four times a day (QID) | ORAL | 0 refills | Status: AC | PRN

## 2023-02-04 MED ORDER — IBUPROFEN 600 MG PO TABS: 600.0000 mg | ORAL_TABLET | Freq: Four times a day (QID) | ORAL | 0 refills | Status: AC | PRN

## 2023-02-04 MED ORDER — OXYCODONE-ACETAMINOPHEN 5-325 MG PO TABS
1.0000 | ORAL_TABLET | Freq: Once | ORAL | Status: AC
  Administered 2023-02-04: 1 via ORAL
  Filled 2023-02-04: qty 1

## 2023-02-04 NOTE — Discharge Instructions (Signed)
Tome los medicamentos segn lo recetado. Compresas tibias en el rea dolorida del cuello izquierdo. Si los sntomas empeoran, regrese al departamento de emergencias para una evaluacin adicional.  Take the medications as prescribed. Warm compresses to the sore area on the left neck. If symptoms worsen, return to the emergency department for further evaluation.

## 2023-02-04 NOTE — ED Provider Triage Note (Signed)
Emergency Medicine Provider Triage Evaluation Note  Karen Barajas , a 32 y.o. female  was evaluated in triage.  Pt complains of head and neck pain.  Review of Systems  Positive: Left head pain behind the ear and neck Negative: Nausea, vomiting, weakness, numbness, visual change  Physical Exam  BP 120/68 (BP Location: Right Arm)   Pulse 69   Temp 98.1 F (36.7 C) (Oral)   Resp 20   Ht 5\' 1"  (1.549 m)   Wt 132.5 kg   SpO2 93%   BMI 55.19 kg/m  Gen:   Awake, no distress  Uncomfortable appearing Resp:  Normal effort  MSK:   Moves extremities without difficulty Pain with active movement LUE - no pain with passive ROM. Equal grip strength bil hands. Other:  Tender over left occipital base  Medical Decision Making  Medically screening exam initiated at 3:22 PM.  Appropriate orders placed.  Wyvonne Casillas Beola Cord was informed that the remainder of the evaluation will be completed by another provider, this initial triage assessment does not replace that evaluation, and the importance of remaining in the ED until their evaluation is complete.  Sudden onset pain left side of her head while in the shower about 2 hours ago. Hurts to move her neck, turn hear, lift left arm. No vomiting, visual change.    Elpidio Anis, PA-C 02/04/23 1524

## 2023-02-04 NOTE — ED Provider Notes (Signed)
[place] EMERGENCY DEPARTMENT AT [facility] Provider Note   CSN: [id number] Arrival date & time: 02/04/23  1441     History  Chief Complaint  Patient presents with   Headache    Karen Barajas is a 32 y.o. female.  Patient was in the shower just prior to arrival in the ED when she had a sudden onset of left sided pain and a "popping" sound in her left ear. Since then she finds it painful to move her neck from side to side or to lift her left arm. No nausea, vomiting. No visual change. No weakness of extremities or numbness. No hearing change or drainage from the ear.   The history is provided by the patient. A language interpreter was used.  Headache      Home Medications Prior to Admission medications   Medication Sig Start Date End Date Taking? Authorizing Provider  ibuprofen (ADVIL) 600 MG tablet Take 1 tablet (600 mg total) by mouth every 6 (six) hours as needed. 02/04/23  Yes Elpidio Anis, PA-C  oxyCODONE-acetaminophen (PERCOCET/ROXICET) 5-325 MG tablet Take 1 tablet by mouth every 6 (six) hours as needed for severe pain (pain score 7-10). 02/04/23  Yes Elpidio Anis, PA-C  acetaminophen (TYLENOL) 325 MG tablet Take 2 tablets (650 mg total) by mouth every 6 (six) hours as needed for mild pain or headache (fever >/= 101). 10/09/18   Lonia Blood, MD  Blood Glucose Monitoring Suppl (TRUE METRIX METER) w/Device KIT 1 each by Does not apply route 2 (two) times a day. 10/15/18   Anders Simmonds, PA-C  diphenhydrAMINE (BENADRYL) 25 MG tablet Take 1 tablet (25 mg total) by mouth every 8 (eight) hours as needed for up to 3 days. 05/16/19 05/19/19  Henderly, Britni A, PA-C  famotidine (PEPCID) 20 MG tablet Take 1 tablet (20 mg total) by mouth 2 (two) times daily. 05/16/19   Henderly, Britni A, PA-C  glucose blood (TRUE METRIX BLOOD GLUCOSE TEST) test strip Use as instructed 10/15/18   Anders Simmonds, PA-C  guaiFENesin-dextromethorphan (ROBITUSSIN DM)  100-10 MG/5ML syrup Take 10 mLs by mouth every 4 (four) hours as needed for cough. Patient not taking: Reported on 11/19/2018 09/26/18   Lonia Blood, MD  metFORMIN (GLUCOPHAGE) 500 MG tablet Take 1 tablet (500 mg total) by mouth 2 (two) times daily with a meal. 10/15/18   McClung, Marzella Schlein, PA-C  TRUEplus Lancets 28G MISC 1 each by Does not apply route 2 (two) times a day. 10/15/18   Anders Simmonds, PA-C      Allergies    Covid-19 (mrna) vaccine and Flexeril [cyclobenzaprine]    Review of Systems   Review of Systems  Neurological:  Positive for headaches.    Physical Exam Updated Vital Signs BP 126/62 (BP Location: Right Arm)   Pulse 70   Temp 98.1 F (36.7 C) (Oral)   Resp 20   Ht 5\' 1"  (1.549 m)   Wt 132.5 kg   SpO2 97%   BMI 55.19 kg/m  Physical Exam Vitals and nursing note reviewed.  Constitutional:      Appearance: She is well-developed. She is obese.  HENT:     Head: Normocephalic and atraumatic.      Right Ear: Tympanic membrane normal.     Left Ear: Tympanic membrane normal.  Eyes:     Extraocular Movements: Extraocular movements intact.     Pupils: Pupils are equal, round, and reactive to light.  Neck:  Comments: No midline cervical tenderness. There is reproducible tenderness of the left paracervical neck at the occipital base.  Cardiovascular:     Rate and Rhythm: Normal rate.  Pulmonary:     Effort: Pulmonary effort is normal.  Musculoskeletal:        General: Normal range of motion.     Cervical back: Normal range of motion and neck supple.     Comments: Active ROM left arm limited by pain on forward flexion and abduction. Passive ROM full and pain-free.  Skin:    General: Skin is warm and dry.  Neurological:     Mental Status: She is alert and oriented to person, place, and time.     GCS: GCS eye subscore is 4. GCS verbal subscore is 5. GCS motor subscore is 6.     Cranial Nerves: Cranial nerves 2-12 are intact.     Sensory: Sensation is  intact.     Motor: Motor function is intact.     Coordination: Coordination is intact.     ED Results / Procedures / Treatments   Labs (all labs ordered are listed, but only abnormal results are displayed) Labs Reviewed  PREGNANCY, URINE    EKG None  Radiology CT Head Wo Contrast  Result Date: 02/04/2023 CLINICAL DATA:  Headache, sudden, severe States she was in the shower, she heard a "thundering" sound and her head started to hurt. Reports any movement with her head, it causes severe pain especially behind her left ear EXAM: CT HEAD WITHOUT CONTRAST TECHNIQUE: Contiguous axial images were obtained from the base of the skull through the vertex without intravenous contrast. RADIATION DOSE REDUCTION: This exam was performed according to the departmental dose-optimization program which includes automated exposure control, adjustment of the mA and/or kV according to patient size and/or use of iterative reconstruction technique. COMPARISON:  None Available. FINDINGS: Brain: No evidence of large-territorial acute infarction. No parenchymal hemorrhage. No mass lesion. No extra-axial collection. No mass effect or midline shift. No hydrocephalus. Basilar cisterns are patent. Vascular: No hyperdense vessel. Skull: No acute fracture or focal lesion. Sinuses/Orbits: Paranasal sinuses and mastoid air cells are clear. The orbits are unremarkable. Other: None. IMPRESSION: No acute intracranial abnormality. Electronically Signed   By: Tish Frederickson M.D.   On: 02/04/2023 17:04    Procedures Procedures    Medications Ordered in ED Medications  oxyCODONE-acetaminophen (PERCOCET/ROXICET) 5-325 MG per tablet 1 tablet (1 tablet Oral Given 02/04/23 1551)    ED Course/ Medical Decision Making/ A&P Clinical Course as of 02/04/23 1941  Mon Feb 04, 2023  1940 Patient to ed with sudden onset pain in the left neck/head while in the shower. Painful movements since. No neurologic deficits. Head CT without  finding. Pain improved with Percocet. Likely musculoskeletal pain. [SU]    Clinical Course User Index [SU] Elpidio Anis, PA-C                                 Medical Decision Making Amount and/or Complexity of Data Reviewed Labs: ordered. Radiology: ordered.  Risk Prescription drug management.           Final Clinical Impression(s) / ED Diagnoses Final diagnoses:  Acute nonintractable headache, unspecified headache type  Musculoskeletal pain    Rx / DC Orders ED Discharge Orders          Ordered    oxyCODONE-acetaminophen (PERCOCET/ROXICET) 5-325 MG tablet  Every 6 hours PRN  02/04/23 1815    ibuprofen (ADVIL) 600 MG tablet  Every 6 hours PRN        02/04/23 1815              Elpidio Anis, PA-C 02/04/23 1941    Terald Sleeper, MD 02/04/23 2035

## 2023-02-04 NOTE — ED Triage Notes (Signed)
Patient is here for evaluation of head pain. States she was in the shower, she heard a "thundering" sound and her head started to hurt. Reports any movement with her head, it causes severe pain especially behind her left ear. Reports this started around 1345 today. Denies any vision or hearing changes.

## 2023-04-08 ENCOUNTER — Other Ambulatory Visit: Payer: Self-pay

## 2023-04-08 ENCOUNTER — Emergency Department (HOSPITAL_COMMUNITY)
Admission: EM | Admit: 2023-04-08 | Discharge: 2023-04-08 | Disposition: A | Discharge status: Home / Self Care | Payer: Self-pay | Attending: Emergency Medicine | Admitting: Emergency Medicine

## 2023-04-08 ENCOUNTER — Emergency Department (HOSPITAL_COMMUNITY): Payer: Self-pay

## 2023-04-08 DIAGNOSIS — N939 Abnormal uterine and vaginal bleeding, unspecified: Secondary | ICD-10-CM | POA: Insufficient documentation

## 2023-04-08 DIAGNOSIS — R102 Pelvic and perineal pain: Secondary | ICD-10-CM | POA: Insufficient documentation

## 2023-04-08 DIAGNOSIS — N8500 Endometrial hyperplasia, unspecified: Secondary | ICD-10-CM | POA: Insufficient documentation

## 2023-04-08 DIAGNOSIS — R9389 Abnormal findings on diagnostic imaging of other specified body structures: Secondary | ICD-10-CM

## 2023-04-08 LAB — URINALYSIS, MICROSCOPIC (REFLEX): RBC / HPF: >50 RBC/hpf (ref 0–5)

## 2023-04-08 LAB — URINALYSIS, ROUTINE W REFLEX MICROSCOPIC

## 2023-04-08 LAB — CBC
HCT: 41.9 % (ref 36.0–46.0)
Hemoglobin: 13.3 g/dL (ref 12.0–15.0)
MCH: 27.8 pg (ref 26.0–34.0)
MCHC: 31.7 g/dL (ref 30.0–36.0)
MCV: 87.7 fL (ref 80.0–100.0)
Platelets: 255 10*3/uL (ref 150–400)
RBC: 4.78 MIL/uL (ref 3.87–5.11)
RDW: 14.4 % (ref 11.5–15.5)
WBC: 7.4 10*3/uL (ref 4.0–10.5)
nRBC: 0 % (ref 0.0–0.2)

## 2023-04-08 LAB — HCG, SERUM, QUALITATIVE: Preg, Serum: NEGATIVE

## 2023-04-08 LAB — BASIC METABOLIC PANEL
Anion gap: 10 (ref 5–15)
BUN: 11 mg/dL (ref 6–20)
CO2: 24 mmol/L (ref 22–32)
Calcium: 9 mg/dL (ref 8.9–10.3)
Chloride: 105 mmol/L (ref 98–111)
Creatinine, Ser: 0.51 mg/dL (ref 0.44–1.00)
GFR, Estimated: >60 mL/min (ref >60)
Glucose, Bld: 97 mg/dL (ref 70–99)
Potassium: 4.6 mmol/L (ref 3.5–5.1)
Sodium: 139 mmol/L (ref 135–145)

## 2023-04-08 LAB — HCG, QUANTITATIVE, PREGNANCY: hCG, Beta Chain, Quant, S: <1 m[IU]/mL (ref <5)

## 2023-04-08 MED ORDER — LIDOCAINE 5 % EX PTCH: 1.0000 | MEDICATED_PATCH | CUTANEOUS | 0 refills | Status: AC

## 2023-04-08 MED ORDER — ACETAMINOPHEN 325 MG PO TABS
650.0000 mg | ORAL_TABLET | Freq: Once | ORAL | Status: AC
  Administered 2023-04-08: 650 mg via ORAL
  Filled 2023-04-08: qty 2

## 2023-04-08 MED ORDER — LIDOCAINE 5 % EX PTCH
1.0000 | MEDICATED_PATCH | CUTANEOUS | Status: DC
  Administered 2023-04-08: 1 via TRANSDERMAL
  Filled 2023-04-08: qty 1

## 2023-04-08 NOTE — ED Notes (Signed)
 Pt taken to ultrasound

## 2023-04-08 NOTE — ED Notes (Signed)
 Pelvic cart at bedside.

## 2023-04-08 NOTE — Discharge Instructions (Addendum)
 Haga un seguimiento con la financial risk analyst de salud de la mujer con respecto a los sntomas recientes y la visita a la sala de sports administrator.  Hoy su ecografa muestra un endometrio engrosado que podra estar causando su sangrado vaginal y deber hablar sobre AINE o anticonceptivos orales u otros tratamientos.  En cuanto a tu dolor de espalda te recet parches de lidocana.  Puede tomar Tylenol  ibuprofeno cada 6 horas necesarias para el dolor.  Si los sntomas cambian o [place], regrese a la sala de sports administrator.  Esta traduccin se realiz con el traductor de [employer] y se hacen todos los esfuerzos posibles para lograr la correccin gramatical.

## 2023-04-08 NOTE — ED Provider Notes (Signed)
 [place] EMERGENCY DEPARTMENT AT [facility] Provider Note   CSN: [id number] Arrival date & time: 04/08/23  1018     History  Chief Complaint  Patient presents with   Vaginal Bleeding    Karen Barajas is a 33 y.o. female with no pertinent past medical history presented with vaginal bleeding for the past 2 weeks.  Patient states that over the past day or so she has noticed blood clots.  Patient denies being pregnant as she is not sexually active and is not concerned for any STDs.  Patient denies dysuria or hematuria.  Patient stenosis she does have some lower back pain that is worse with movement and hurts with palpation of the back but denies any heavy lifting, paresthesias, new onset weakness, saddle anesthesia, urinary/bowel incontinence, IVDU, fevers.  Patient denies abdominal pain, nausea/vomiting.  Spanish translator [phone]  Home Medications Prior to Admission medications   Medication Sig Start Date End Date Taking? Authorizing Provider  lidocaine  (LIDODERM ) 5 % Place 1 patch onto the skin daily. Remove & Discard patch within 12 hours or as directed by MD 04/08/23  Yes Victor Lynwood DASEN, PA-C  acetaminophen  (TYLENOL ) 325 MG tablet Take 2 tablets (650 mg total) by mouth every 6 (six) hours as needed for mild pain or headache (fever >/= 101). 10/09/18   Danton Reyes DASEN, MD  Blood Glucose Monitoring Suppl (TRUE METRIX METER) w/Device KIT 1 each by Does not apply route 2 (two) times a day. 10/15/18   McClung, Angela M, PA-C  diphenhydrAMINE  (BENADRYL ) 25 MG tablet Take 1 tablet (25 mg total) by mouth every 8 (eight) hours as needed for up to 3 days. 05/16/19 05/19/19  Henderly, Britni A, PA-C  famotidine  (PEPCID ) 20 MG tablet Take 1 tablet (20 mg total) by mouth 2 (two) times daily. 05/16/19   Henderly, Britni A, PA-C  glucose blood (TRUE METRIX BLOOD GLUCOSE TEST) test strip Use as instructed 10/15/18   Danton Jon HERO, PA-C  guaiFENesin -dextromethorphan  (ROBITUSSIN DM)  100-10 MG/5ML syrup Take 10 mLs by mouth every 4 (four) hours as needed for cough. Patient not taking: Reported on 11/19/2018 09/26/18   Danton Reyes DASEN, MD  ibuprofen  (ADVIL ) 600 MG tablet Take 1 tablet (600 mg total) by mouth every 6 (six) hours as needed. 02/04/23   Odell Balls, PA-C  metFORMIN  (GLUCOPHAGE ) 500 MG tablet Take 1 tablet (500 mg total) by mouth 2 (two) times daily with a meal. 10/15/18   McClung, Jon HERO, PA-C  oxyCODONE -acetaminophen  (PERCOCET/ROXICET) 5-325 MG tablet Take 1 tablet by mouth every 6 (six) hours as needed for severe pain (pain score 7-10). 02/04/23   Odell Balls, PA-C  TRUEplus Lancets 28G MISC 1 each by Does not apply route 2 (two) times a day. 10/15/18   McClung, Angela M, PA-C      Allergies    Covid-19 (mrna) vaccine and Flexeril  [cyclobenzaprine ]    Review of Systems   Review of Systems  Genitourinary:  Positive for vaginal bleeding.    Physical Exam Updated Vital Signs BP 97/68 (BP Location: Right Arm)   Pulse 97   Temp 98.5 F (36.9 C) (Oral)   Resp 18   SpO2 97%  Physical Exam Constitutional:      General: She is not in acute distress. Cardiovascular:     Rate and Rhythm: Normal rate.     Pulses: Normal pulses.  Abdominal:     Palpations: Abdomen is soft.     Tenderness: There is no abdominal tenderness.  There is no guarding or rebound.  Musculoskeletal:     Comments: Bilateral paralumbar musculature tenderness without midline tenderness, able to ambulate without difficulty and range lower extremities with good strength 5 out of 5 bilateral hip flexion No midline tenderness or abnormalities palpated  Skin:    General: Skin is warm and dry.     Capillary Refill: Capillary refill takes less than 2 seconds.  Neurological:     Mental Status: She is alert.     Comments: Sensation intact distally 2+ bilateral patellar reflexes Able to ambulate without difficulty does endorse pain when ambulating  Psychiatric:        Mood and  Affect: Mood normal.     ED Results / Procedures / Treatments   Labs (all labs ordered are listed, but only abnormal results are displayed) Labs Reviewed  URINALYSIS, ROUTINE W REFLEX MICROSCOPIC - Abnormal; Notable for the following components:      Result Value   Color, Urine RED (*)    APPearance TURBID (*)    Glucose, UA   (*)    Value: TEST NOT REPORTED DUE TO COLOR INTERFERENCE OF URINE PIGMENT   Hgb urine dipstick   (*)    Value: TEST NOT REPORTED DUE TO COLOR INTERFERENCE OF URINE PIGMENT   Bilirubin Urine   (*)    Value: TEST NOT REPORTED DUE TO COLOR INTERFERENCE OF URINE PIGMENT   Ketones, ur   (*)    Value: TEST NOT REPORTED DUE TO COLOR INTERFERENCE OF URINE PIGMENT   Protein, ur   (*)    Value: TEST NOT REPORTED DUE TO COLOR INTERFERENCE OF URINE PIGMENT   Nitrite   (*)    Value: TEST NOT REPORTED DUE TO COLOR INTERFERENCE OF URINE PIGMENT   Leukocytes,Ua   (*)    Value: TEST NOT REPORTED DUE TO COLOR INTERFERENCE OF URINE PIGMENT   All other components within normal limits  URINALYSIS, MICROSCOPIC (REFLEX) - Abnormal; Notable for the following components:   Bacteria, UA FEW (*)    All other components within normal limits  HCG, SERUM, QUALITATIVE  CBC  HCG, QUANTITATIVE, PREGNANCY  BASIC METABOLIC PANEL    EKG None  Radiology US  PELVIC COMPLETE W TRANSVAGINAL AND TORSION R/O Result Date: 04/08/2023 CLINICAL DATA:  Vaginal bleeding EXAM: TRANSABDOMINAL AND TRANSVAGINAL ULTRASOUND OF PELVIS DOPPLER ULTRASOUND OF OVARIES TECHNIQUE: Both transabdominal and transvaginal ultrasound examinations of the pelvis were performed. Transabdominal technique was performed for global imaging of the pelvis including uterus, ovaries, adnexal regions, and pelvic cul-de-sac. It was necessary to proceed with endovaginal exam following the transabdominal exam to visualize the endometrium. Color and duplex Doppler ultrasound was utilized to evaluate blood flow to the ovaries.  COMPARISON:  Pelvic ultrasound 10/16/2015 FINDINGS: Uterus Measurements: 10.4 x 4.7 x 6.2 cm = volume: 159.4 mL. No fibroids or other mass visualized. Endometrium Thickness: 18.9 mm.  No focal abnormality visualized. Right ovary Measurements: 2.2 x 1.1 x 3.4 cm = volume: 4.3 mL. Normal appearance/no adnexal mass. Left ovary Measurements: 3.2 x 1.5 x 2.5 cm = volume: 6.5 mL. Normal appearance/no adnexal mass. Pulsed Doppler evaluation of both ovaries demonstrates normal low-resistance arterial and venous waveforms. Other findings No abnormal free fluid. IMPRESSION: 1. Endometrium is thickened measuring 18.9 mm. If bleeding remains unresponsive to hormonal or medical therapy, focal lesion work-up with sonohysterogram should be considered. Endometrial biopsy should also be considered in pre-menopausal patients at high risk for endometrial carcinoma. (Ref: Radiological Reasoning: Algorithmic Workup of Abnormal Vaginal Bleeding  with Endovaginal Sonography and Sonohysterography. AJR 2008; [id number]:D31-26) Electronically Signed   By: Bard Moats M.D.   On: 04/08/2023 13:26    Procedures Procedures    Medications Ordered in ED Medications  acetaminophen  (TYLENOL ) tablet 650 mg (has no administration in time range)  lidocaine  (LIDODERM ) 5 % 1 patch (has no administration in time range)    ED Course/ Medical Decision Making/ A&P                                 Medical Decision Making Amount and/or Complexity of Data Reviewed Labs: ordered. Radiology: ordered.  Risk OTC drugs. Prescription drug management.   Karen Barajas 33 y.o. presented today for vaginal bleeding. Working DDx that I considered at this time includes, but not limited to, UTI, menstrual cycle, AUB, trauma, PID/TOA, endometrial CA, hypothyroidism, FB, STI, hemorrhagic cyst, ovarian torsion, miscarriage.  LMP: 1 month ago  R/o DDx: UTI, menstrual cycle, trauma, PID/TOA, endometrial CA, hypothyroidism, FB, STI, hemorrhagic cyst,  ovarian torsion, miscarriage: These are considered less likely due to history of present illness, physical exam, labs/imaging findings  Review of prior external notes: 02/04/2023 ED  Unique Tests and My Interpretation:  CBC: Unremarkable BMP: Unremarkable UA: Bloody hCG serum qualitative: Negative hCG serum quantitative: Less than 1 Pelvic U/S: Thickened endometrium that will need follow-up  Social Determinants of Health: uninsured  Discussion with Independent Historian: None  Discussion of Management of Tests: None  Risk: Medium: prescription drug management  Risk Stratification Score: None  Plan: On exam patient was no acute distress stable vitals.  I was able to reproduce patient's back pain palpation of the paralumbar musculature and so do suspect this to be MSK in nature given her reassuring exam with intact patellar reflexes and endorsing red flag symptoms.  Patient's hCG serum qualitative was negative and so we will get pelvic ultrasound as patient does not want to be swabbed for any STDs.  Pelvic ultrasound does show thickened endometrium which I do suspect is causing patient's bleeding.  Will have patient follow-up with the women's health clinic to have this further evaluated.  Patient was also given Lidoderm  patches and prescribed this and encouraged to use Tylenol  every 6 hours needed for pain and to rest the next few days as her back pain is most likely MSK.  UA came back bloody.  Patient is not endorsing any dysuria and with the diagnosis of thickened endometrium do feel this is patient's cause for her pain and so we will discharge her with outpatient follow-up.  Patient was given return precautions. Patient stable for discharge at this time.  Patient verbalized understanding of plan.  This chart was dictated using voice recognition software.  Despite best efforts to proofread,  errors can occur which can change the documentation meaning.         Final Clinical  Impression(s) / ED Diagnoses Final diagnoses:  Thickened endometrium  Abnormal uterine bleeding (AUB)    Rx / DC Orders ED Discharge Orders          Ordered    lidocaine  (LIDODERM ) 5 %  Every 24 hours        04/08/23 1415              Victor Lynwood DASEN, PA-C 04/08/23 1442    Mannie Pac T, DO 04/09/23 1022

## 2023-04-08 NOTE — ED Triage Notes (Signed)
 Patient reports vaginal bleeding and pelvic cramping x 2 weeks but worse since last night. Endorses intermittent dizziness.

## 2023-05-14 ENCOUNTER — Emergency Department (HOSPITAL_COMMUNITY)
Admission: EM | Admit: 2023-05-14 | Discharge: 2023-05-15 | Disposition: A | Discharge status: Home / Self Care | Payer: Self-pay | Attending: Emergency Medicine | Admitting: Emergency Medicine

## 2023-05-14 ENCOUNTER — Other Ambulatory Visit: Payer: Self-pay

## 2023-05-14 DIAGNOSIS — K611 Rectal abscess: Secondary | ICD-10-CM | POA: Insufficient documentation

## 2023-05-14 DIAGNOSIS — Z7984 Long term (current) use of oral hypoglycemic drugs: Secondary | ICD-10-CM | POA: Insufficient documentation

## 2023-05-14 DIAGNOSIS — R7303 Prediabetes: Secondary | ICD-10-CM | POA: Insufficient documentation

## 2023-05-14 DIAGNOSIS — Z79899 Other long term (current) drug therapy: Secondary | ICD-10-CM | POA: Insufficient documentation

## 2023-05-14 MED ORDER — METRONIDAZOLE 500 MG PO TABS: 500.0000 mg | ORAL_TABLET | Freq: Two times a day (BID) | ORAL | 0 refills | Status: AC

## 2023-05-14 MED ORDER — OXYCODONE-ACETAMINOPHEN 5-325 MG PO TABS
1.0000 | ORAL_TABLET | Freq: Once | ORAL | Status: AC
  Administered 2023-05-14: 1 via ORAL
  Filled 2023-05-14: qty 1

## 2023-05-14 MED ORDER — CIPROFLOXACIN HCL 500 MG PO TABS
500.0000 mg | ORAL_TABLET | Freq: Once | ORAL | Status: AC
  Administered 2023-05-14: 500 mg via ORAL
  Filled 2023-05-14: qty 1

## 2023-05-14 MED ORDER — HYDROCODONE-ACETAMINOPHEN 5-325 MG PO TABS: 1.0000 | ORAL_TABLET | Freq: Four times a day (QID) | ORAL | 0 refills | Status: AC | PRN

## 2023-05-14 MED ORDER — METRONIDAZOLE 500 MG PO TABS
500.0000 mg | ORAL_TABLET | Freq: Once | ORAL | Status: AC
  Administered 2023-05-14: 500 mg via ORAL
  Filled 2023-05-14: qty 1

## 2023-05-14 MED ORDER — LIDOCAINE-EPINEPHRINE (PF) 2 %-1:200000 IJ SOLN
10.0000 mL | Freq: Once | INTRAMUSCULAR | Status: AC
  Administered 2023-05-14: 10 mL
  Filled 2023-05-14: qty 20

## 2023-05-14 MED ORDER — CIPROFLOXACIN HCL 500 MG PO TABS: 500.0000 mg | ORAL_TABLET | Freq: Two times a day (BID) | ORAL | 0 refills | Status: AC

## 2023-05-14 NOTE — Discharge Instructions (Signed)
 Do warm soaks for 20 min 2 times a day for the next 2-3 days.  Also there is a small piece of cotton where I cut.  If it is still there in 3 days you can pull it out. Return if it is getting worse and not better in 3-4 days.  Start the antibiotics today.  Use the pain medication as needed.

## 2023-05-14 NOTE — ED Notes (Addendum)
 Karen Barajas

## 2023-05-14 NOTE — ED Notes (Signed)
 Interpreter [phone] Hillary used

## 2023-05-14 NOTE — ED Notes (Signed)
 Nurse to provider for pain medication for patient comfort at this time. Patient crying in triage room due to pain

## 2023-05-15 NOTE — ED Provider Notes (Signed)
 [place] EMERGENCY DEPARTMENT AT [facility] Provider Note   CSN: [id number] Arrival date & time: 05/14/23  2151     History  Chief Complaint  Patient presents with   Rectal Pain    Patient reported after wiping with the thin tissue at work Friday on Saturday she started to feel sharp pain and started to feel like it was little bumps in there. Sunday pain got worse and she couldn't sit down or use the bathroom. Pain 10/10 no bleeding reported    Karen Barajas is a 33 y.o. female.  Patient is a 33 year old female with a history of prediabetes but no other known medical problems who is presenting today with a 3-day history of rectal pain that is gradually worsening.  She has been able to have bowel movements and denies any systemic symptoms such as fever or chills.  No nausea or vomiting.  She denies any rectal bleeding.  No prior history of similar symptoms.  The history is provided by the patient. The history is limited by a language barrier. A language interpreter was used.       Home Medications Prior to Admission medications   Medication Sig Start Date End Date Taking? Authorizing Provider  ciprofloxacin (CIPRO) 500 MG tablet Take 1 tablet (500 mg total) by mouth every 12 (twelve) hours. 05/14/23  Yes Gwyneth Sprout, MD  HYDROcodone-acetaminophen (NORCO/VICODIN) 5-325 MG tablet Take 1 tablet by mouth every 6 (six) hours as needed. 05/14/23  Yes Gwyneth Sprout, MD  metroNIDAZOLE (FLAGYL) 500 MG tablet Take 1 tablet (500 mg total) by mouth 2 (two) times daily. 05/14/23  Yes Gwyneth Sprout, MD  acetaminophen (TYLENOL) 325 MG tablet Take 2 tablets (650 mg total) by mouth every 6 (six) hours as needed for mild pain or headache (fever >/= 101). 10/09/18   Lonia Blood, MD  Blood Glucose Monitoring Suppl (TRUE METRIX METER) w/Device KIT 1 each by Does not apply route 2 (two) times a day. 10/15/18   Anders Simmonds, PA-C  diphenhydrAMINE (BENADRYL) 25 MG  tablet Take 1 tablet (25 mg total) by mouth every 8 (eight) hours as needed for up to 3 days. 05/16/19 05/19/19  Henderly, Britni A, PA-C  famotidine (PEPCID) 20 MG tablet Take 1 tablet (20 mg total) by mouth 2 (two) times daily. 05/16/19   Henderly, Britni A, PA-C  glucose blood (TRUE METRIX BLOOD GLUCOSE TEST) test strip Use as instructed 10/15/18   Anders Simmonds, PA-C  guaiFENesin-dextromethorphan (ROBITUSSIN DM) 100-10 MG/5ML syrup Take 10 mLs by mouth every 4 (four) hours as needed for cough. Patient not taking: Reported on 11/19/2018 09/26/18   Lonia Blood, MD  ibuprofen (ADVIL) 600 MG tablet Take 1 tablet (600 mg total) by mouth every 6 (six) hours as needed. 02/04/23   Elpidio Anis, PA-C  lidocaine (LIDODERM) 5 % Place 1 patch onto the skin daily. Remove & Discard patch within 12 hours or as directed by MD 04/08/23   Netta Corrigan, PA-C  metFORMIN (GLUCOPHAGE) 500 MG tablet Take 1 tablet (500 mg total) by mouth 2 (two) times daily with a meal. 10/15/18   McClung, Marzella Schlein, PA-C  oxyCODONE-acetaminophen (PERCOCET/ROXICET) 5-325 MG tablet Take 1 tablet by mouth every 6 (six) hours as needed for severe pain (pain score 7-10). 02/04/23   Elpidio Anis, PA-C  TRUEplus Lancets 28G MISC 1 each by Does not apply route 2 (two) times a day. 10/15/18   Anders Simmonds, PA-C  Allergies    Covid-19 (mrna) vaccine and Flexeril [cyclobenzaprine]    Review of Systems   Review of Systems  Physical Exam Updated Vital Signs BP (!) 153/95   Pulse 92   Temp (!) 97.4 F (36.3 C) (Oral)   Resp 18   SpO2 100%  Physical Exam Vitals and nursing note reviewed.  HENT:     Head: Normocephalic.  Cardiovascular:     Rate and Rhythm: Normal rate.  Pulmonary:     Effort: Pulmonary effort is normal.  Genitourinary:   Neurological:     Mental Status: She is alert. Mental status is at baseline.  Psychiatric:        Mood and Affect: Mood normal.     ED Results / Procedures / Treatments    Labs (all labs ordered are listed, but only abnormal results are displayed) Labs Reviewed - No data to display  EKG None  Radiology No results found.  Procedures Procedures   INCISION AND DRAINAGE Performed by: Gwyneth Sprout Consent: Verbal consent obtained. Risks and benefits: risks, benefits and alternatives were discussed Type: abscess  Body area: rectum  Anesthesia: local infiltration  Incision was made with a scalpel.  Local anesthetic: lidocaine 2% with epinephrine  Anesthetic total: 10 ml  Complexity: complex Blunt dissection to break up loculations  Drainage: purulent  Drainage amount: 5mL  Packing material: 1/4 in iodoform gauze  Patient tolerance: Patient tolerated the procedure well with no immediate complications.   Medications Ordered in ED Medications  oxyCODONE-acetaminophen (PERCOCET/ROXICET) 5-325 MG per tablet 1 tablet (1 tablet Oral Given 05/14/23 2219)  lidocaine-EPINEPHrine (XYLOCAINE W/EPI) 2 %-1:200000 (PF) injection 10 mL (10 mLs Infiltration Given 05/14/23 2233)  metroNIDAZOLE (FLAGYL) tablet 500 mg (500 mg Oral Given 05/14/23 2259)  ciprofloxacin (CIPRO) tablet 500 mg (500 mg Oral Given 05/14/23 2259)    ED Course/ Medical Decision Making/ A&P                                 Medical Decision Making Risk Prescription drug management.   Patient presenting with exam findings today consistent with a perirectal abscess.  She denies any systemic symptoms.  No evidence of hemorrhoids at this time.  I&D as above with pus drainage.  Patient was started on antibiotics and to continue warm soaks.  Instructed to return in 3 to 4 days if symptoms are worsening.        Final Clinical Impression(s) / ED Diagnoses Final diagnoses:  Perirectal abscess    Rx / DC Orders ED Discharge Orders          Ordered    HYDROcodone-acetaminophen (NORCO/VICODIN) 5-325 MG tablet  Every 6 hours PRN        05/14/23 2255    ciprofloxacin (CIPRO)  500 MG tablet  Every 12 hours        05/14/23 2255    metroNIDAZOLE (FLAGYL) 500 MG tablet  2 times daily        05/14/23 2255              Gwyneth Sprout, MD 05/15/23 [phone]

## 2024-03-22 ENCOUNTER — Other Ambulatory Visit: Payer: Self-pay

## 2024-03-22 ENCOUNTER — Emergency Department (HOSPITAL_COMMUNITY)
Admission: EM | Admit: 2024-03-22 | Discharge: 2024-03-22 | Disposition: A | Discharge status: Home / Self Care | Payer: Self-pay | Attending: Emergency Medicine | Admitting: Emergency Medicine

## 2024-03-22 DIAGNOSIS — H6691 Otitis media, unspecified, right ear: Secondary | ICD-10-CM | POA: Insufficient documentation

## 2024-03-22 MED ORDER — AMOXICILLIN 500 MG PO CAPS: 500.0000 mg | ORAL_CAPSULE | Freq: Three times a day (TID) | ORAL | 0 refills | Status: AC

## 2024-03-22 NOTE — ED Provider Notes (Signed)
 " [place] EMERGENCY DEPARTMENT AT [facility] Provider Note   CSN: [id number] Arrival date & time: 03/22/24  1315     Patient presents with: No chief complaint on file.   Karen Barajas is a 33 y.o. female.  Right sided ear pain after viral URI, worse over the last day, no drainage or trauma to ear. Fever yesterday. No CP, SOB or cough. No trauma.    HPI     Prior to Admission medications  Medication Sig Start Date End Date Taking? Authorizing Provider  acetaminophen  (TYLENOL ) 325 MG tablet Take 2 tablets (650 mg total) by mouth every 6 (six) hours as needed for mild pain or headache (fever >/= 101). 10/09/18   Danton Reyes DASEN, MD  Blood Glucose Monitoring Suppl (TRUE METRIX METER) w/Device KIT 1 each by Does not apply route 2 (two) times a day. 10/15/18   Danton Jon HERO, PA-C  ciprofloxacin  (CIPRO ) 500 MG tablet Take 1 tablet (500 mg total) by mouth every 12 (twelve) hours. 05/14/23   Doretha Folks, MD  diphenhydrAMINE  (BENADRYL ) 25 MG tablet Take 1 tablet (25 mg total) by mouth every 8 (eight) hours as needed for up to 3 days. 05/16/19 05/19/19  Henderly, Britni A, PA-C  famotidine  (PEPCID ) 20 MG tablet Take 1 tablet (20 mg total) by mouth 2 (two) times daily. 05/16/19   Henderly, Britni A, PA-C  glucose blood (TRUE METRIX BLOOD GLUCOSE TEST) test strip Use as instructed 10/15/18   Danton Jon HERO, PA-C  guaiFENesin -dextromethorphan  (ROBITUSSIN DM) 100-10 MG/5ML syrup Take 10 mLs by mouth every 4 (four) hours as needed for cough. Patient not taking: Reported on 11/19/2018 09/26/18   Danton Reyes DASEN, MD  HYDROcodone -acetaminophen  (NORCO/VICODIN) 5-325 MG tablet Take 1 tablet by mouth every 6 (six) hours as needed. 05/14/23   Doretha Folks, MD  ibuprofen  (ADVIL ) 600 MG tablet Take 1 tablet (600 mg total) by mouth every 6 (six) hours as needed. 02/04/23   Odell Balls, PA-C  lidocaine  (LIDODERM ) 5 % Place 1 patch onto the skin daily. Remove & Discard patch  within 12 hours or as directed by MD 04/08/23   Victor Lynwood DASEN, PA-C  metFORMIN  (GLUCOPHAGE ) 500 MG tablet Take 1 tablet (500 mg total) by mouth 2 (two) times daily with a meal. 10/15/18   McClung, Jon HERO, PA-C  metroNIDAZOLE  (FLAGYL ) 500 MG tablet Take 1 tablet (500 mg total) by mouth 2 (two) times daily. 05/14/23   Doretha Folks, MD  oxyCODONE -acetaminophen  (PERCOCET/ROXICET) 5-325 MG tablet Take 1 tablet by mouth every 6 (six) hours as needed for severe pain (pain score 7-10). 02/04/23   Odell Balls, PA-C  TRUEplus Lancets 28G MISC 1 each by Does not apply route 2 (two) times a day. 10/15/18   McClung, Angela M, PA-C    Allergies: Covid-19 (mrna) vaccine and Flexeril  [cyclobenzaprine ]    Review of Systems  HENT:  Positive for ear pain.     Updated Vital Signs There were no vitals taken for this visit.  Physical Exam Vitals and nursing note reviewed.  Constitutional:      General: She is not in acute distress.    Appearance: She is not toxic-appearing.  HENT:     Head: Normocephalic and atraumatic.     Right Ear: Tympanic membrane is erythematous and bulging. Tympanic membrane is not perforated.     Left Ear: Tympanic membrane is not erythematous or bulging.  Eyes:     General: No scleral icterus.    Conjunctiva/sclera:  Conjunctivae normal.  Cardiovascular:     Rate and Rhythm: Normal rate and regular rhythm.     Pulses: Normal pulses.     Heart sounds: Normal heart sounds.  Pulmonary:     Effort: Pulmonary effort is normal. No respiratory distress.     Breath sounds: Normal breath sounds.  Abdominal:     General: Abdomen is flat. Bowel sounds are normal.     Palpations: Abdomen is soft.     Tenderness: There is no abdominal tenderness.  Skin:    General: Skin is warm and dry.     Findings: No lesion.  Neurological:     General: No focal deficit present.     Mental Status: She is alert and oriented to person, place, and time. Mental status is at baseline.      (all labs ordered are listed, but only abnormal results are displayed) Labs Reviewed - No data to display  EKG: None  Radiology: No results found.   Procedures   Medications Ordered in the ED - No data to display                                  Medical Decision Making Risk Prescription drug management.   This patient presents to the ED for concern of flu like symptoms, this involves an extensive number of treatment options, and is a complaint that carries with it a high risk of complications and morbidity.  The differential diagnosis includes pneumonia, viral URI with cough, otitis media, otitis externa, strep throat, mono, other viral pharyngitis, peritonsillar abscess    Problem List / ED Course / Critical interventions / Medication management  Patient reporting with right ear pain after uri like symptoms. She reports fever, cough, runny nose. These have now resolved, but started having right ear pain, this is getting worse. She tried putting ice in her are. She is found to have right AOM. No sign of otitis externa. Lungs are clear to auscultation. She is very well appearing and vitals are stable.  I have reviewed the patients home medicines and have made adjustments as needed Patient hemodynamically stable and well-appearing throughout stay.  Feel appropriate for discharge with outpatient follow-up.  Discussed symptom management and return precautions.         Final diagnoses:  Right acute otitis media    ED Discharge Orders          Ordered    amoxicillin  (AMOXIL ) 500 MG capsule  3 times daily        03/22/24 1339               Ladarrius Bogdanski, Warren SAILOR, PA-C 03/22/24 1357    Kingsley, Victoria K, DO 03/22/24 1403  "

## 2024-03-22 NOTE — Discharge Instructions (Addendum)
 Please alternate Tylenol  and Ibuprofen  for fever and pain control. You have an ear infection, Please take Amoxicillin  as prescribed. Return to ER with new or worsening symptoms.

## 2024-03-22 NOTE — ED Triage Notes (Signed)
 Spanish speaking, iPad interpreter used.   Pt has complaints of RIGHT ear pain that began on Friday. Pt describes her ear as feeling stopped up. Pt denies drainage.
